# Patient Record
Sex: Male | Born: 1985
Health system: Southern US, Community
[De-identification: ages and names within clinical notes are randomized; demographics above are authoritative.]

## PROBLEM LIST (undated history)

## (undated) DIAGNOSIS — E785 Hyperlipidemia, unspecified: Secondary | ICD-10-CM

## (undated) DIAGNOSIS — F329 Major depressive disorder, single episode, unspecified: Secondary | ICD-10-CM

## (undated) DIAGNOSIS — F32A Depression, unspecified: Secondary | ICD-10-CM

## (undated) DIAGNOSIS — Z8619 Personal history of other infectious and parasitic diseases: Secondary | ICD-10-CM

## (undated) HISTORY — DX: Depression, unspecified: F32.A

## (undated) HISTORY — PX: NO PAST SURGERIES: SHX2092

## (undated) HISTORY — DX: Personal history of other infectious and parasitic diseases: Z86.19

## (undated) HISTORY — DX: Hyperlipidemia, unspecified: E78.5

## (undated) HISTORY — PX: WISDOM TOOTH EXTRACTION: SHX21

---
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
## 2003-08-17 ENCOUNTER — Encounter: Admission: RE | Admit: 2003-08-17 | Discharge: 2003-08-17 | Payer: Self-pay | Admitting: Family Medicine

## 2006-01-20 ENCOUNTER — Encounter: Admission: RE | Admit: 2006-01-20 | Discharge: 2006-01-20 | Payer: Self-pay | Admitting: Family Medicine

## 2006-04-21 ENCOUNTER — Emergency Department (HOSPITAL_COMMUNITY): Admission: EM | Admit: 2006-04-21 | Discharge: 2006-04-22 | Payer: Self-pay | Admitting: Emergency Medicine

## 2008-06-03 ENCOUNTER — Emergency Department (HOSPITAL_COMMUNITY): Admission: EM | Admit: 2008-06-03 | Discharge: 2008-06-03 | Payer: Self-pay | Admitting: Emergency Medicine

## 2014-04-25 ENCOUNTER — Encounter: Payer: Self-pay | Admitting: Family Medicine

## 2014-04-25 DIAGNOSIS — Z0289 Encounter for other administrative examinations: Secondary | ICD-10-CM

## 2014-04-25 NOTE — Progress Notes (Signed)
error    This encounter was created in error - please disregard.

## 2014-09-26 ENCOUNTER — Encounter (HOSPITAL_COMMUNITY): Payer: Self-pay | Admitting: Emergency Medicine

## 2014-09-26 ENCOUNTER — Emergency Department (HOSPITAL_COMMUNITY)
Admission: EM | Admit: 2014-09-26 | Discharge: 2014-09-26 | Disposition: A | Discharge status: Home / Self Care | Payer: 59 | Attending: Emergency Medicine | Admitting: Emergency Medicine

## 2014-09-26 DIAGNOSIS — R51 Headache: Secondary | ICD-10-CM

## 2014-09-26 DIAGNOSIS — R7989 Other specified abnormal findings of blood chemistry: Secondary | ICD-10-CM | POA: Diagnosis present

## 2014-09-26 DIAGNOSIS — B2790 Infectious mononucleosis, unspecified without complication: Secondary | ICD-10-CM | POA: Diagnosis not present

## 2014-09-26 DIAGNOSIS — Z87891 Personal history of nicotine dependence: Secondary | ICD-10-CM | POA: Insufficient documentation

## 2014-09-26 DIAGNOSIS — R519 Headache, unspecified: Secondary | ICD-10-CM

## 2014-09-26 LAB — URINALYSIS, ROUTINE W REFLEX MICROSCOPIC
Glucose, UA: NEGATIVE mg/dL
Hgb urine dipstick: NEGATIVE
KETONES UR: 15 mg/dL — AB
NITRITE: NEGATIVE
PROTEIN: NEGATIVE mg/dL
Specific Gravity, Urine: 1.018 (ref 1.005–1.030)
Urobilinogen, UA: 4 mg/dL — ABNORMAL HIGH (ref 0.0–1.0)
pH: 6 (ref 5.0–8.0)

## 2014-09-26 LAB — CBC WITH DIFFERENTIAL/PLATELET
Basophils Absolute: 0.1 10*3/uL (ref 0.0–0.1)
Basophils Relative: 1 % (ref 0–1)
EOS ABS: 0 10*3/uL (ref 0.0–0.7)
Eosinophils Relative: 0 % (ref 0–5)
HCT: 41.3 % (ref 39.0–52.0)
Hemoglobin: 14.1 g/dL (ref 13.0–17.0)
LYMPHS PCT: 75 % — AB (ref 12–46)
Lymphs Abs: 4.9 10*3/uL — ABNORMAL HIGH (ref 0.7–4.0)
MCH: 29.7 pg (ref 26.0–34.0)
MCHC: 34.1 g/dL (ref 30.0–36.0)
MCV: 86.9 fL (ref 78.0–100.0)
MONOS PCT: 3 % (ref 3–12)
Monocytes Absolute: 0.2 10*3/uL (ref 0.1–1.0)
Neutro Abs: 1.4 10*3/uL — ABNORMAL LOW (ref 1.7–7.7)
Neutrophils Relative %: 21 % — ABNORMAL LOW (ref 43–77)
Platelets: 133 10*3/uL — ABNORMAL LOW (ref 150–400)
RBC: 4.75 MIL/uL (ref 4.22–5.81)
RDW: 13.3 % (ref 11.5–15.5)
WBC: 6.6 10*3/uL (ref 4.0–10.5)

## 2014-09-26 LAB — URINE MICROSCOPIC-ADD ON

## 2014-09-26 LAB — I-STAT CHEM 8, ED
BUN: 5 mg/dL — ABNORMAL LOW (ref 6–23)
Calcium, Ion: 1.13 mmol/L (ref 1.12–1.23)
Chloride: 98 mmol/L (ref 96–112)
Creatinine, Ser: 0.9 mg/dL (ref 0.50–1.35)
Glucose, Bld: 109 mg/dL — ABNORMAL HIGH (ref 70–99)
HCT: 43 % (ref 39.0–52.0)
HEMOGLOBIN: 14.6 g/dL (ref 13.0–17.0)
Potassium: 3.8 mmol/L (ref 3.5–5.1)
Sodium: 138 mmol/L (ref 135–145)
TCO2: 22 mmol/L (ref 0–100)

## 2014-09-26 MED ORDER — VALPROATE SODIUM 500 MG/5ML IV SOLN
500.0000 mg | Freq: Once | INTRAVENOUS | Status: AC
  Administered 2014-09-26: 500 mg via INTRAVENOUS
  Filled 2014-09-26: qty 5

## 2014-09-26 MED ORDER — METOCLOPRAMIDE HCL 5 MG/ML IJ SOLN
10.0000 mg | Freq: Once | INTRAMUSCULAR | Status: AC
  Administered 2014-09-26: 10 mg via INTRAVENOUS
  Filled 2014-09-26: qty 2

## 2014-09-26 MED ORDER — DIPHENHYDRAMINE HCL 50 MG/ML IJ SOLN
25.0000 mg | Freq: Once | INTRAMUSCULAR | Status: AC
  Administered 2014-09-26: 25 mg via INTRAVENOUS
  Filled 2014-09-26: qty 1

## 2014-09-26 MED ORDER — ACETAMINOPHEN 325 MG PO TABS
650.0000 mg | ORAL_TABLET | Freq: Once | ORAL | Status: AC
  Administered 2014-09-26: 650 mg via ORAL
  Filled 2014-09-26: qty 2

## 2014-09-26 MED ORDER — SODIUM CHLORIDE 0.9 % IV BOLUS (SEPSIS)
1000.0000 mL | INTRAVENOUS | Status: AC
  Administered 2014-09-26: 1000 mL via INTRAVENOUS

## 2014-09-26 MED ORDER — DEXAMETHASONE SODIUM PHOSPHATE 10 MG/ML IJ SOLN
10.0000 mg | Freq: Once | INTRAMUSCULAR | Status: AC
  Administered 2014-09-26: 10 mg via INTRAVENOUS
  Filled 2014-09-26: qty 1

## 2014-09-26 MED ORDER — DEXAMETHASONE SODIUM PHOSPHATE 10 MG/ML IJ SOLN
10.0000 mg | Freq: Once | INTRAMUSCULAR | Status: DC
Start: 2014-09-26 — End: 2014-09-26

## 2014-09-26 NOTE — ED Provider Notes (Signed)
CSN: [id number]     Arrival date & time 09/26/14  1702 History   First MD Initiated Contact with Patient 09/26/14 1833     Chief Complaint  Patient presents with  . Abnormal Lab     (Consider location/radiation/quality/duration/timing/severity/associated sxs/prior Treatment) Patient is a 29 y.o. male presenting with headaches. The history is provided by the patient.  Headache Pain location:  Frontal Quality:  Dull Radiates to:  Eyes Severity currently:  10/10 Severity at highest:  10/10 Onset quality:  Gradual Duration:  5 days Timing:  Constant Progression:  Worsening Chronicity:  New Context comment:  At rest Relieved by:  Nothing Worsened by:  Activity Ineffective treatments:  NSAIDs Associated symptoms: fatigue   Associated symptoms: no abdominal pain, no cough, no diarrhea, no eye pain, no fever, no nausea, no neck pain, no numbness and no vomiting     History reviewed. No pertinent past medical history. History reviewed. No pertinent past surgical history. No family history on file. History  Substance Use Topics  . Smoking status: Former Games developer  . Smokeless tobacco: Not on file  . Alcohol Use: Yes     Comment: ocassional    Review of Systems  Constitutional: Positive for fatigue. Negative for fever.  HENT: Negative for drooling and rhinorrhea.   Eyes: Negative for pain.  Respiratory: Negative for cough and shortness of breath.   Cardiovascular: Negative for chest pain and leg swelling.  Gastrointestinal: Negative for nausea, vomiting, abdominal pain and diarrhea.  Genitourinary: Negative for dysuria and hematuria.  Musculoskeletal: Negative for gait problem and neck pain.  Skin: Negative for color change.  Neurological: Positive for headaches. Negative for numbness.  Hematological: Negative for adenopathy.  Psychiatric/Behavioral: Negative for behavioral problems.  All other systems reviewed and are negative.     Allergies  Review of patient's  allergies indicates not on file.  Home Medications   Prior to Admission medications   Not on File   BP 140/77 mmHg  Pulse 97  Temp(Src) 98.6 F (37 C) (Oral)  Resp 18  Ht  (1.854 m)  Wt 215 lb (97.523 kg)  BMI 28.37 kg/m2  SpO2 99% Physical Exam  Constitutional: He is oriented to person, place, and time. He appears well-developed and well-nourished.  HENT:  Head: Normocephalic and atraumatic.  Right Ear: External ear normal.  Left Ear: External ear normal.  Nose: Nose normal.  Mouth/Throat: Oropharynx is clear and moist. No oropharyngeal exudate.  No significant cervical adenopathy.  Eyes: Conjunctivae and EOM are normal. Pupils are equal, round, and reactive to light.  Neck: Normal range of motion. Neck supple.  Cardiovascular: Normal rate, regular rhythm, normal heart sounds and intact distal pulses.  Exam reveals no gallop and no friction rub.   No murmur heard. Pulmonary/Chest: Effort normal and breath sounds normal. No respiratory distress. He has no wheezes.  Abdominal: Soft. Bowel sounds are normal. He exhibits no distension. There is no tenderness. There is no rebound and no guarding.  Musculoskeletal: Normal range of motion. He exhibits no edema or tenderness.  Neurological: He is alert and oriented to person, place, and time.  alert, oriented x3 speech: normal in context and clarity memory: intact grossly cranial nerves II-XII: intact motor strength: full proximally and distally no involuntary movements or tremors sensation: intact to light touch diffusely  cerebellar: finger-to-nose and heel-to-shin intact gait: normal forwards and backwards  Skin: Skin is warm and dry.  Psychiatric: He has a normal mood and affect. His behavior is normal.  Nursing note and vitals reviewed.   ED Course  Procedures (including critical care time) Labs Review Labs Reviewed  CBC WITH DIFFERENTIAL/PLATELET - Abnormal; Notable for the following:    Platelets 133 (*)     Neutrophils Relative % 21 (*)    Lymphocytes Relative 75 (*)    Neutro Abs 1.4 (*)    Lymphs Abs 4.9 (*)    All other components within normal limits  I-STAT CHEM 8, ED - Abnormal; Notable for the following:    BUN 5 (*)    Glucose, Bld 109 (*)    All other components within normal limits  URINALYSIS, ROUTINE W REFLEX MICROSCOPIC    Imaging Review No results found.   EKG Interpretation None      MDM   Final diagnoses:  Mononucleosis  Headache, unspecified headache type    7:06 PM 29 y.o. male who presents with a gradual onset headache which began 5 days ago. He denies any fevers. He has taken NSAIDs without significant relief. He states that he has developed fatigue and dark urine. He denies any vomiting, diarrhea, sore throat, or abdominal pain. He was seen at an urgent care [facility] this morning. He was found to have a platelet count of 145, lymphocytosis, bilirubin of 5.8, AST of 141, and ALT of 245. He states that he also had a monotest performed and was called and was informed that it was positive. This would likely explain his lab abnormalities. He is afebrile and vital signs are unremarkable here. He has a normal neurologic exam. Will treat headache symptomatically.  10:57 PM: HA now 2/10. Pt feeling much better. I do suspect that his complex of lab abn's, fatigue, HA is related to mononucleosis. He has a normal neuro exam and continues to appear well.  I have discussed the diagnosis/risks/treatment options with the patient and believe the pt to be eligible for discharge home to follow-up with his pcp. We also discussed returning to the ED immediately if new or worsening sx occur. We discussed the sx which are most concerning (e.g., worsening HA, fever) that necessitate immediate return. Medications administered to the patient during their visit and any new prescriptions provided to the patient are listed below.  Medications given during this visit Medications   sodium chloride 0.9 % bolus 1,000 mL (0 mLs Intravenous Stopped 09/26/14 2024)  metoCLOPramide (REGLAN) injection 10 mg (10 mg Intravenous Given 09/26/14 1907)  diphenhydrAMINE (BENADRYL) injection 25 mg (25 mg Intravenous Given 09/26/14 1907)  acetaminophen (TYLENOL) tablet 650 mg (650 mg Oral Given 09/26/14 1906)  dexamethasone (DECADRON) injection 10 mg (10 mg Intravenous Given 09/26/14 2116)  valproate (DEPACON) 500 mg in dextrose 5 % 50 mL IVPB (0 mg Intravenous Stopped 09/26/14 2236)  sodium chloride 0.9 % bolus 1,000 mL (1,000 mLs Intravenous New Bag/Given 09/26/14 2117)    Discharge Medication List as of 09/26/2014 10:59 PM       Purvis SheffieldForrest Tanna Loeffler, MD 09/27/14 1501

## 2014-09-26 NOTE — ED Notes (Signed)
Spoke with Pharmacy, Per MD he wants the [place] to run over 20 mins. Although the order states over one hour. Per Pharmacy rate should be set at, 120 ML/ HR

## 2014-09-26 NOTE — ED Notes (Signed)
Pt st's [facility] for persistent headache of 5 days.  Pt st's he just received a call and was told he tested positive for mono.  Pt was [facility] to [facility] but st's he had rather come here

## 2014-09-28 LAB — PATHOLOGIST SMEAR REVIEW: Path Review: REACTIVE

## 2014-10-13 ENCOUNTER — Encounter: Payer: Self-pay | Admitting: Family Medicine

## 2014-10-13 ENCOUNTER — Ambulatory Visit (INDEPENDENT_AMBULATORY_CARE_PROVIDER_SITE_OTHER): Payer: 59 | Admitting: Family Medicine

## 2014-10-13 VITALS — BP 112/74 | HR 88 | Temp 97.9°F | Ht 71.75 in | Wt 205.0 lb

## 2014-10-13 DIAGNOSIS — Z7189 Other specified counseling: Secondary | ICD-10-CM

## 2014-10-13 DIAGNOSIS — G8929 Other chronic pain: Secondary | ICD-10-CM

## 2014-10-13 DIAGNOSIS — M549 Dorsalgia, unspecified: Secondary | ICD-10-CM

## 2014-10-13 DIAGNOSIS — Z7689 Persons encountering health services in other specified circumstances: Secondary | ICD-10-CM

## 2014-10-13 DIAGNOSIS — Z0000 Encounter for general adult medical examination without abnormal findings: Secondary | ICD-10-CM

## 2014-10-13 NOTE — Progress Notes (Signed)
Pre visit review using our clinic review tool, if applicable. No additional management support is needed unless otherwise documented below in the visit note. 

## 2014-10-13 NOTE — Patient Instructions (Signed)
BEFORE YOU LEAVE: -schedule fasting lab appointment  We recommend the following healthy lifestyle measures: - eat a healthy diet consisting of lots of vegetables, fruits, beans, nuts, seeds, healthy meats such as white chicken and fish and whole grains.  - avoid fried foods, fast food, processed foods, sodas, red meet and other fattening foods.  - get a least 150 minutes of aerobic exercise per week.

## 2014-10-13 NOTE — Progress Notes (Signed)
HPI:  Jonathan Strong is here to establish care. Needs   Has the following chronic problems that require follow up and concerns today:  Chronic Back Pain: -DDD saw several back specialists for this in 2015, radiation in the past (sciatica) - saw [name] in the past and had inj in the past (2015-07/2014) -sees chiropractor for this now and is doing better -denies: weakness, numbness, pain now, hx of bowel or bladder incontinence  Has been working on improving diet and has quit drinking. No regular CV exercise but plans to start biking.  ROS negative for unless reported above: fevers, unintentional weight loss, hearing or vision loss, chest pain, palpitations, struggling to breath, hemoptysis, melena, hematochezia, hematuria, falls, loc, si, thoughts of self harm  Past Medical History  Diagnosis Date  . History of chicken pox   . Hyperlipidemia   . History of mononucleosis     [facility] per patient    No past surgical history on file.  Family History  Problem Relation Age of Onset  . Mental illness Brother   . Bipolar disorder Brother   . Schizophrenia Father     History   Social History  . Marital Status: Married    Spouse Name: N/A  . Number of Children: N/A  . Years of Education: N/A   Social History Main Topics  . Smoking status: Former Games developermoker  . Smokeless tobacco: Not on file  . Alcohol Use: No     Comment: ocassional  . Drug Use: No  . Sexual Activity: Not on file   Other Topics Concern  . 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  EXAMCeasar Mons:  Filed Vitals:   10/13/14 1111  BP: 112/74  Pulse: 88  Temp: 97.9 F (36.6 C)    Body  mass index is 28.01 kg/(m^2).  GENERAL: vitals reviewed and listed above, alert, oriented, appears well hydrated and in no acute distress  HEENT: atraumatic, conjunttiva clear, no obvious abnormalities on inspection of external nose and ears  NECK: no obvious masses on inspection  LUNGS: clear to auscultation bilaterally, no wheezes, rales or rhonchi, good air movement  CV: HRRR, no peripheral edema  GU: deferred  MS: moves all extremities without noticeable abnormality  PSYCH: pleasant and cooperative, no obvious depression or anxiety  ASSESSMENT AND PLAN:  Discussed the following assessment and plan:  Visit for preventive health examination - Plan: HIV antibody (with reflex), Lipid Panel, Hemoglobin A1c  Encounter to establish care  Chronic back pain, Low Back Pain  -We reviewed the PMH, PSH, FH, SH, Meds and Allergies. -We provided refills for any medications we will prescribe as needed. -We addressed current concerns per orders and patient instructions. -We have asked for records for pertinent exams, studies, vaccines and notes from previous providers. -We have advised patient to follow up per instructions below. -ad   -Patient advised to return or notify a doctor immediately if symptoms worsen or persist or new concerns arise.  Patient Instructions  BEFORE YOU LEAVE: -schedule fasting lab appointment  We recommend the  following healthy lifestyle measures: - eat a healthy diet consisting of lots of vegetables, fruits, beans, nuts, seeds, healthy meats such as white chicken and fish and whole grains.  - avoid fried foods, fast food, processed foods, sodas, red meet and other fattening foods.  - get a least 150 minutes of aerobic exercise per week.        Kriste Basque R.

## 2014-10-17 ENCOUNTER — Other Ambulatory Visit (INDEPENDENT_AMBULATORY_CARE_PROVIDER_SITE_OTHER): Payer: 59

## 2014-10-17 DIAGNOSIS — R7989 Other specified abnormal findings of blood chemistry: Secondary | ICD-10-CM

## 2014-10-17 DIAGNOSIS — Z0000 Encounter for general adult medical examination without abnormal findings: Secondary | ICD-10-CM

## 2014-10-17 LAB — LIPID PANEL
CHOLESTEROL: 173 mg/dL (ref 0–200)
HDL: 29.6 mg/dL — ABNORMAL LOW (ref >39.00)
NonHDL: 143.4
Total CHOL/HDL Ratio: 6
Triglycerides: 239 mg/dL — ABNORMAL HIGH (ref 0.0–149.0)
VLDL: 47.8 mg/dL — ABNORMAL HIGH (ref 0.0–40.0)

## 2014-10-17 LAB — HEMOGLOBIN A1C: Hgb A1c MFr Bld: 5.5 % (ref 4.6–6.5)

## 2014-10-17 LAB — LDL CHOLESTEROL, DIRECT: Direct LDL: 105 mg/dL

## 2014-10-18 LAB — HIV ANTIBODY (ROUTINE TESTING W REFLEX): HIV 1&2 Ab, 4th Generation: NONREACTIVE

## 2015-03-28 ENCOUNTER — Encounter: Payer: Self-pay | Admitting: Family Medicine

## 2015-03-28 ENCOUNTER — Ambulatory Visit (INDEPENDENT_AMBULATORY_CARE_PROVIDER_SITE_OTHER): Payer: 59 | Admitting: Family Medicine

## 2015-03-28 VITALS — BP 108/80 | HR 99 | Temp 99.3°F | Ht 71.75 in | Wt 203.1 lb

## 2015-03-28 DIAGNOSIS — J029 Acute pharyngitis, unspecified: Secondary | ICD-10-CM

## 2015-03-28 LAB — POCT RAPID STREP A (OFFICE): Rapid Strep A Screen: NEGATIVE

## 2015-03-28 MED ORDER — AMOXICILLIN 875 MG PO TABS: 875.0000 mg | ORAL_TABLET | Freq: Two times a day (BID) | ORAL | Status: DC

## 2015-03-28 NOTE — Progress Notes (Signed)
   HPI:  Sore throat: -started: 4 days ago -symptoms:sore throat, fevers, gi distress with diarrhea - this has now resolved -denies:SOB, vomiting, tooth pain, inability to swallow, sinus congestion or pain, cough -has tried: nothing -sick contacts/travel/risks: coworker with strep prior to him getting symptoms; denies tick bite, STI exposure, travel  ROS: See pertinent positives and negatives per HPI.  Past Medical History  Diagnosis Date  . History of chicken pox   . Hyperlipidemia   . History of mononucleosis     treated at [facility] 3 weeks ago per patient    No past surgical history on file.  Family History  Problem Relation Age of Onset  . Mental illness Brother   . Bipolar disorder Brother   . Schizophrenia Father     Social History   Social History  . Marital Status: Married    Spouse Name: N/A  . Number of Children: N/A  . Years of Education: N/A   Social History Main Topics  . Smoking status: Former Games developer  . Smokeless tobacco: None  . Alcohol Use: No     Comment: ocassional  . Drug Use: No  . Sexual Activity: Not Asked   Other Topics Concern  . 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  amoxicillin (AMOXIL) 875 MG tablet, Take 1 tablet (875 mg total) by mouth 2 (two) times daily., Disp: 20 tablet, Rfl: 0  EXAM:  Filed Vitals:   03/28/15 1104  BP: 108/80  Pulse: 99  Temp: 99.3 F (37.4 C)    Body mass index is 27.75 kg/(m^2).  GENERAL: vitals reviewed and listed above, alert, oriented, appears well hydrated and in no acute distress  HEENT: atraumatic, conjunttiva clear, no obvious abnormalities on inspection of external nose and ears, normal appearance of ear  canals and TMs, mild post oropharyngeal erythema, 1+ tonsillar edema w/ exudate, no sinus TTP  NECK: no obvious masses on inspection  LUNGS: clear to auscultation bilaterally, no wheezes, rales or rhonchi, good air movement  CV: HRRR, no peripheral edema  MS: moves all extremities without noticeable abnormality  PSYCH: pleasant and cooperative, no obvious depression or anxiety  ASSESSMENT AND PLAN:  Discussed the following assessment and plan:  Acute pharyngitis, unspecified pharyngitis type  -known strep exposure and symptoms and exam c/w strep pharyngitis - opted to treat (discussed risks) regardless of rapid strep results -discussed other potential causes, offered HIV testing - declined -of course, we advised to return or notify a doctor immediately if symptoms worsen or persist or new concerns arise.    There are no Patient Instructions on file for this visit.   Kriste Basque R.

## 2015-03-28 NOTE — Progress Notes (Signed)
Pre visit review using our clinic review tool, if applicable. No additional management support is needed unless otherwise documented below in the visit note. 

## 2015-03-31 LAB — CULTURE, GROUP A STREP

## 2015-06-01 ENCOUNTER — Telehealth: Payer: Self-pay | Admitting: Family Medicine

## 2015-06-01 NOTE — Telephone Encounter (Signed)
I do not have a recommendation for a particular clinic for chiropractic care - this is usually something patients seek out and do on their own.

## 2015-06-01 NOTE — Telephone Encounter (Signed)
I left a detailed message with the information below at the pts cell number. 

## 2015-06-01 NOTE — Telephone Encounter (Signed)
Patient would like a referral for a chiropractor for his back problems.

## 2015-09-27 ENCOUNTER — Encounter: Payer: Self-pay | Admitting: Family Medicine

## 2015-09-27 ENCOUNTER — Ambulatory Visit (INDEPENDENT_AMBULATORY_CARE_PROVIDER_SITE_OTHER): Payer: BLUE CROSS/BLUE SHIELD | Admitting: Family Medicine

## 2015-09-27 VITALS — BP 120/84 | HR 86 | Temp 98.4°F | Ht 71.75 in | Wt 207.1 lb

## 2015-09-27 DIAGNOSIS — M549 Dorsalgia, unspecified: Secondary | ICD-10-CM | POA: Diagnosis not present

## 2015-09-27 DIAGNOSIS — M25531 Pain in right wrist: Secondary | ICD-10-CM | POA: Diagnosis not present

## 2015-09-27 DIAGNOSIS — G8929 Other chronic pain: Secondary | ICD-10-CM

## 2015-09-27 MED ORDER — DIAZEPAM 2 MG PO TABS: 2.0000 mg | ORAL_TABLET | Freq: Two times a day (BID) | ORAL | Status: DC | PRN

## 2015-09-27 NOTE — Patient Instructions (Signed)
Before you leave: -Schedule follow-up in 4 weeks, may call several days before and cancel if this pain is completely gone  Ice twice daily.  Naproxen as instructed for pain as needed.  Please continue gentle activities.  I do not usually prescribe Valium, however given her situation, there is a new prescription for a small amount to use rarely for severe muscle spasm.

## 2015-09-27 NOTE — Progress Notes (Signed)
Pre visit review using our clinic review tool, if applicable. No additional management support is needed unless otherwise documented below in the visit note. 
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
## 2015-10-26 ENCOUNTER — Encounter: Payer: Self-pay | Admitting: Family Medicine

## 2015-10-26 ENCOUNTER — Ambulatory Visit (INDEPENDENT_AMBULATORY_CARE_PROVIDER_SITE_OTHER): Payer: BLUE CROSS/BLUE SHIELD | Admitting: Family Medicine

## 2015-10-26 VITALS — BP 110/80 | HR 85 | Temp 98.6°F | Ht 71.75 in | Wt 206.4 lb

## 2015-10-26 DIAGNOSIS — M549 Dorsalgia, unspecified: Secondary | ICD-10-CM | POA: Diagnosis not present

## 2015-10-26 DIAGNOSIS — G8929 Other chronic pain: Secondary | ICD-10-CM

## 2015-10-26 NOTE — Patient Instructions (Addendum)
Before you leave:  - after your physical in about 2-3 months; please come fasting and we will plan to do lab work that day  Please try the dietary and other treatments that we discussed , healthy diet, tumeric,  Ice as needed. Please call here or follow up with your orthopedic doctor if symptoms are not resolved in 1 month.  If your orthopedic office requires a referral please let us know.

## 2015-10-26 NOTE — Progress Notes (Signed)
Pre visit review using our clinic review tool, if applicable. No additional management support is needed unless otherwise documented below in the visit note. 
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
## 2016-02-12 ENCOUNTER — Ambulatory Visit (INDEPENDENT_AMBULATORY_CARE_PROVIDER_SITE_OTHER): Payer: BLUE CROSS/BLUE SHIELD | Admitting: Family Medicine

## 2016-02-12 ENCOUNTER — Encounter: Payer: BLUE CROSS/BLUE SHIELD | Admitting: Family Medicine

## 2016-02-12 ENCOUNTER — Encounter: Payer: Self-pay | Admitting: Family Medicine

## 2016-02-12 VITALS — BP 110/82 | HR 77 | Temp 98.0°F | Ht 71.75 in | Wt 207.8 lb

## 2016-02-12 DIAGNOSIS — Z9852 Vasectomy status: Secondary | ICD-10-CM

## 2016-02-12 DIAGNOSIS — Z0000 Encounter for general adult medical examination without abnormal findings: Secondary | ICD-10-CM

## 2016-02-12 DIAGNOSIS — G8929 Other chronic pain: Secondary | ICD-10-CM

## 2016-02-12 DIAGNOSIS — IMO0001 Reserved for inherently not codable concepts without codable children: Secondary | ICD-10-CM

## 2016-02-12 DIAGNOSIS — M549 Dorsalgia, unspecified: Secondary | ICD-10-CM | POA: Diagnosis not present

## 2016-02-12 DIAGNOSIS — E785 Hyperlipidemia, unspecified: Secondary | ICD-10-CM

## 2016-02-12 LAB — CHOLESTEROL, TOTAL: Cholesterol: 206 mg/dL — ABNORMAL HIGH (ref 0–200)

## 2016-02-12 LAB — HDL CHOLESTEROL: HDL: 38.4 mg/dL — ABNORMAL LOW (ref >39.00)

## 2016-02-12 NOTE — Progress Notes (Signed)
HPI:  Here for CPE:  -Concerns and/or follow up today:   Dyslipidemia: -advised med diet and exercise -reports healthy diet and regular exercise  Desires vasectomy. Has 2 "perfect children ".reports why would I messed with that. Is sure that he is done having children.  Many moles. Sees dermatologist for skin exams.  Reports wrist and back pains completely resolved. Continues home exercises for the back. Reports no flares in the long time.  -Diet: variety of foods, balance and well rounded  -Exercise: regular exercise  -Diabetes and Dyslipidemia Screening: Not fasting  -Hx of HTN: no  -Vaccines: Declines  -sexual activity: yes, male partner - wife, no new partners  -wants STI testing, Hep C screening (if born [id number]): no  -FH colon or prstate ca: see FH Last colon cancer screening:n/a Last prostate ca screening:n/a  -Alcohol, Tobacco, drug use: see social history  Review of Systems - no fevers, unintentional weight loss, vision loss, hearing loss, chest pain, sob, hemoptysis, melena, hematochezia, hematuria, genital discharge, changing or concerning skin lesions, bleeding, bruising, loc, thoughts of self harm or SI  Past Medical History  Diagnosis Date  . History of chicken pox   . Hyperlipidemia   . History of mononucleosis     [facility] per patient    No past surgical history on file.  Family History  Problem Relation Age of Onset  . Mental illness Brother   . Bipolar disorder Brother   . Schizophrenia Father     Social History   Social History  . Marital Status: Married    Spouse Name: N/A  . Number of Children: N/A  . Years of Education: N/A   Social History Main Topics  . Smoking status: Former Games developermoker  . Smokeless tobacco: None  . Alcohol Use: No     Comment: ocassional  . Drug Use: No  . Sexual Activity: Not Asked   Other Topics Concern  . 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  diazepam (VALIUM) 2 MG tablet, Take 1 tablet (2 mg total) by mouth every 12 (twelve) hours as needed for muscle spasms., Disp: 10 tablet, Rfl: 0  EXAM:  Filed Vitals:   02/12/16 0822  BP: 110/82  Pulse: 77  Temp: 98 F (36.7 C)  TempSrc: Oral  Height: 5' 11.75" (1.822 m)  Weight: 207 lb 12.8 oz (94.257 kg)    Estimated body mass index is 28.39 kg/(m^2) as calculated from the following:   Height as of this encounter: 5' 11.75" (1.822 m).   Weight as of this encounter: 207 lb 12.8 oz (94.257 kg).  GENERAL: vitals reviewed and listed below, alert, oriented, appears well hydrated and in no acute distress  HEENT: head atraumatic, PERRLA, normal appearance of eyes, ears, nose and mouth. moist mucus membranes.  NECK: supple, no masses or lymphadenopathy  LUNGS: clear to auscultation bilaterally, no rales, rhonchi or wheeze  CV: HRRR, no peripheral edema or cyanosis, normal pedal pulses  ABDOMEN: bowel sounds normal, soft, non tender to palpation, no masses, no rebound or guarding  GU: Declined  SKIN: Does for skin check dermatologist  MS: normal gait, moves all extremities normally  NEURO: normal gait, speech and thought processing grossly intact, muscle tone grossly intact throughout  PSYCH: normal affect, pleasant and cooperative  ASSESSMENT AND PLAN:  Discussed the following assessment and plan:  Visit for preventive health examination  Chronic back pain, Low Back Pain  Hyperlipemia - Plan: Cholesterol, Total, HDL cholesterol  Vasectomy planned - Plan: Ambulatory referral to Urology  -Discussed and advised all [place] preventive services health task force level A and B recommendations for age, sex and  risks.  --Advised at least 150 minutes of exercise per week and a healthy diet with avoidance of (less then 1 serving per week) processed foods, white starches, red meat, fast foods and sweets and consisting of: * 5-9 servings of fresh fruits and vegetables (not corn or potatoes) *nuts and seeds, beans *olives and olive oil *lean meats such as fish and white chicken  *whole grains  -skin exams with gyn  -non-fasting labs, studies and vaccines per orders this encounter   Patient advised to return to clinic immediately if symptoms worsen or persist or new concerns.  Patient Instructions  BEFORE YOU LEAVE: -follow up: yearly -labs  -We placed a referral for you as discussed for the vasectomy. It usually takes about 1-2 weeks to process and schedule this referral. If you have not heard from [place] regarding this appointment in 2 weeks please contact our office.   We recommend the following healthy lifestyle: 1) Small portions - eat off of salad plate instead of dinner plate 2) Eat a healthy clean diet with avoidance of (less then 1 serving per week) processed foods, sweetened drinks, white starches, red meat, fast foods and sweets and consisting of: * 5-9 servings per day of fresh or frozen fruits and vegetables (not corn or potatoes, not dried or canned) *nuts and seeds, beans *olives and olive oil *small portions of lean meats such as fish and white chicken  *small portions of whole grains 3)Get at least 150 minutes of sweaty aerobic exercise per week 4)reduce stress - counseling, meditation, relaxation to balance other aspects of your life  We have ordered labs or studies at this visit. It can take up to 1-2 weeks for results and processing. IF results require follow up or explanation, we will call you with instructions. Clinically stable results will be released to your [facility]. If you have not heard from [place] or cannot find your results in [facility] in 2 weeks please contact our office at  [phone].  If you are not yet signed up for [facility], please consider signing up.            No Follow-up on file.   Kriste Basque R., DO

## 2016-02-12 NOTE — Patient Instructions (Signed)
BEFORE YOU LEAVE: -follow up: yearly -labs  -We placed a referral for you as discussed for the vasectomy. It usually takes about 1-2 weeks to process and schedule this referral. If you have not heard from us regarding this appointment in 2 weeks please contact our office.   We recommend the following healthy lifestyle: 1) Small portions - eat off of salad plate instead of dinner plate 2) Eat a healthy clean diet with avoidance of (less then 1 serving per week) processed foods, sweetened drinks, white starches, red meat, fast foods and sweets and consisting of: * 5-9 servings per day of fresh or frozen fruits and vegetables (not corn or potatoes, not dried or canned) *nuts and seeds, beans *olives and olive oil *small portions of lean meats such as fish and white chicken  *small portions of whole grains 3)Get at least 150 minutes of sweaty aerobic exercise per week 4)reduce stress - counseling, meditation, relaxation to balance other aspects of your life  We have ordered labs or studies at this visit. It can take up to 1-2 weeks for results and processing. IF results require follow up or explanation, we will call you with instructions. Clinically stable results will be released to your [facility]. If you have not heard from [place] or cannot find your results in [facility] in 2 weeks please contact our office at [phone].  If you are not yet signed [facility] for [facility], please consider signing up.

## 2016-02-12 NOTE — Progress Notes (Signed)
Pre visit review using our clinic review tool, if applicable. No additional management support is needed unless otherwise documented below in the visit note. 

## 2016-09-10 ENCOUNTER — Encounter: Payer: Self-pay | Admitting: Family Medicine

## 2016-09-10 ENCOUNTER — Ambulatory Visit (INDEPENDENT_AMBULATORY_CARE_PROVIDER_SITE_OTHER): Payer: BLUE CROSS/BLUE SHIELD | Admitting: Family Medicine

## 2016-09-10 VITALS — BP 127/77 | HR 100 | Temp 98.7°F | Ht 71.75 in | Wt 212.0 lb

## 2016-09-10 DIAGNOSIS — J111 Influenza due to unidentified influenza virus with other respiratory manifestations: Secondary | ICD-10-CM | POA: Diagnosis not present

## 2016-09-10 MED ORDER — OSELTAMIVIR PHOSPHATE 75 MG PO CAPS: 75.0000 mg | ORAL_CAPSULE | Freq: Two times a day (BID) | ORAL | 0 refills | Status: DC

## 2016-09-10 MED ORDER — PROMETHAZINE HCL 25 MG PO TABS: 25.0000 mg | ORAL_TABLET | ORAL | 0 refills | Status: DC | PRN

## 2016-09-10 NOTE — Progress Notes (Signed)
Pre visit review using our clinic review tool, if applicable. No additional management support is needed unless otherwise documented below in the visit note. 

## 2016-09-10 NOTE — Progress Notes (Signed)
Subjective:    Patient ID: Jonathan Strong, male    DOB: [birth year], 31 y.o.   MRN: [id number]  HPI Here for the onset last night of fever, body aches, headache, a dry cough, and nausea without vomiting. No ST. 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 His wife was seen here 2 days ago with similar symptoms, and she tested positive for the flu. She was given Tamiflu and now she feels much better.    Review of Systems  Constitutional: Positive for chills and fever.  HENT: Negative for congestion, postnasal drip, sinus pain, sinus pressure and sore throat.   Eyes: Negative.   Respiratory: Positive for cough.   Cardiovascular: Negative.   Gastrointestinal: Positive for nausea. Negative for abdominal pain, diarrhea and vomiting.  Musculoskeletal: Positive for myalgias.  Neurological: Positive for headaches.       Objective:   Physical Exam  Constitutional:  Appears ill   HENT:  Right Ear: External ear normal.  Left Ear: External ear normal.  Nose: Nose normal.  Mouth/Throat: Oropharynx is clear and moist.  Eyes: Conjunctivae are normal.  Neck: No thyromegaly present.  Cardiovascular: Normal rate, regular rhythm, normal heart sounds and intact distal pulses.   Pulmonary/Chest: Effort normal and breath sounds normal. No respiratory distress. He has no wheezes. He has no rales.  Lymphadenopathy:    He has no cervical adenopathy.          Assessment & Plan:  This is almost certainly influenza. Treat with Tamiflu for 5 days. Use Phenergan for nausea. Drink fluids and take Ibuprofen prn. Written out of work today and tomorrow.  Gershon Crane, MD

## 2016-11-03 ENCOUNTER — Ambulatory Visit (INDEPENDENT_AMBULATORY_CARE_PROVIDER_SITE_OTHER): Payer: BLUE CROSS/BLUE SHIELD | Admitting: Family Medicine

## 2016-11-03 ENCOUNTER — Encounter: Payer: Self-pay | Admitting: Family Medicine

## 2016-11-03 ENCOUNTER — Ambulatory Visit (INDEPENDENT_AMBULATORY_CARE_PROVIDER_SITE_OTHER)
Admission: RE | Admit: 2016-11-03 | Discharge: 2016-11-03 | Disposition: A | Discharge status: Home / Self Care | Payer: BLUE CROSS/BLUE SHIELD | Source: Ambulatory Visit | Attending: Family Medicine | Admitting: Family Medicine

## 2016-11-03 VITALS — BP 118/82 | HR 72 | Temp 97.9°F | Ht 71.75 in | Wt 208.4 lb

## 2016-11-03 DIAGNOSIS — M25511 Pain in right shoulder: Secondary | ICD-10-CM

## 2016-11-03 DIAGNOSIS — R42 Dizziness and giddiness: Secondary | ICD-10-CM | POA: Diagnosis not present

## 2016-11-03 DIAGNOSIS — M25512 Pain in left shoulder: Secondary | ICD-10-CM

## 2016-11-03 DIAGNOSIS — R202 Paresthesia of skin: Secondary | ICD-10-CM

## 2016-11-03 LAB — TSH: TSH: 1.38 u[IU]/mL (ref 0.35–4.50)

## 2016-11-03 LAB — VITAMIN B12: Vitamin B-12: 360 pg/mL (ref 211–911)

## 2016-11-03 NOTE — Patient Instructions (Signed)
Follow up: -1 month -xray sheet -labs -rotator cuff and neck spasm exercises.  Get the xray of the neck.  Home exercises 4 days per week.  Get the MRI of the head.  Cock up wrist brace for the L wrist.  Aleve if needed for pain per instructions.  I hope you are feeling better soon! Seek care immediately if worsening, new concerns or you are not improving with treatment.

## 2016-11-03 NOTE — Progress Notes (Signed)
Pre visit review using our clinic review tool, if applicable. No additional management support is needed unless otherwise documented below in the visit note. 

## 2016-11-03 NOTE — Progress Notes (Signed)
HPI:  Acute visit for a number of symptoms: -these all started about 1-1.5 months ago -symptoms:   -R> L mod pain only with abd arm above 90 - particularly with weights  -L> R hand paraesthesias, can be whole hand, but usually 1st 3 digits, brief  spells for a few minutes. Riding bike and platelet donating worsens.  -L traps neck pain, mod, intermittent  -"seeing stars" brief, lasts a few seconds, occurs when standing at rest - has had  several spells -hx lumbar back issues, worried about pinched nerve in neck -no fevers, malaise, LE symptoms, weight loss, weakness, LOC, HA, recent illness, vision changes, speech issues, AMS   ROS: See pertinent positives and negatives per HPI.  Past Medical History:  Diagnosis Date  . History of chicken pox   . History of mononucleosis    treated at [facility] 3 weeks ago per patient  . Hyperlipidemia     No past surgical history on file.  Family History  Problem Relation Age of Onset  . Mental illness Brother   . Bipolar disorder Brother   . Schizophrenia Father     Social History   Social History  . Marital status: Married    Spouse name: N/A  . Number of children: N/A  . Years of education: N/A   Social History Main Topics  . Smoking status: Former Games developer  . Smokeless tobacco: Never Used  . Alcohol use No     Comment: ocassional  . Drug use: No  . Sexual activity: Not Asked   Other Topics Concern  . 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  diazepam (VALIUM) 2 MG tablet, Take 1 tablet (2 mg total) by mouth every 12 (twelve) hours as needed for muscle spasms., Disp: 10 tablet, Rfl: 0  EXAM:  Vitals:   11/03/16 0959  BP: 118/82  Pulse: 72   Temp: 97.9 F (36.6 C)    Body mass index is 28.46 kg/m.  GENERAL: vitals reviewed and listed above, alert, oriented, appears well hydrated and in no acute distress  HEENT: atraumatic, conjunttiva clear, no obvious abnormalities on inspection of external nose and ears  NECK: no obvious masses on inspection  LUNGS: clear to auscultation bilaterally, no wheezes, rales or rhonchi, good air movement  CV: HRRR, no peripheral edema  MS/NEURO: moves all extremities without noticeable abnormality, minimal L mid fiber trap TTP, minimal L RTC TTP, no bony TTP neck/shoulders, normal ROM head/neck/UE, neg spurling, normal strngth/sensitivity to light touch and DTRs bilat UEs, bilat UE special tests: -shaw sign, - apprehension, -neers, -speeds, ? Mildly + R impingement, -empty can, -phalen, -tinel, normal radial pulses  PSYCH/NEURO: pleasant and cooperative, no obvious depression or anxiety, speech and thought processing grossly intact, CN II-XII grossly intact, finger to nose normal, gait normal  ASSESSMENT AND PLAN:  Discussed the following assessment and plan:  Bilateral shoulder pain, unspecified chronicity - Plan: MR Brain W Wo Contrast, DG Cervical Spine Complete  Paresthesia - -L hand - Plan: TSH, Vitamin B12, MR Brain W Wo Contrast, DG Cervical Spine Complete  Dizzy spells -  Plan: MR Brain W Wo Contrast  -we discussed possible serious and likely etiologies, workup and treatment, treatment risks and return precautions -after this discussion, Jonathan Strong opted for: -will get labs and imaging per orders to r/o other -HEP for possible RTC tendonopathy, trap muscle spasm -cock up brace for L wrist, ? CTS -close f/u in 1 months -Patient advised to return or notify a doctor immediately if symptoms worsen or persist or new concerns arise.  Patient Instructions  Follow up: -1 month -xray sheet -labs -rotator cuff and neck spasm exercises.  Get the xray of the neck.  Home exercises 4  days per week.  Get the MRI of the head.  Cock up wrist brace for the L wrist.  Aleve if needed for pain per instructions.  I hope you are feeling better soon! Seek care immediately if worsening, new concerns or you are not improving with treatment.     Kriste Basque R., DO

## 2016-11-17 ENCOUNTER — Other Ambulatory Visit: Payer: BLUE CROSS/BLUE SHIELD

## 2016-11-24 ENCOUNTER — Encounter: Payer: Self-pay | Admitting: Family Medicine

## 2016-11-24 ENCOUNTER — Ambulatory Visit (INDEPENDENT_AMBULATORY_CARE_PROVIDER_SITE_OTHER): Payer: BLUE CROSS/BLUE SHIELD | Admitting: Family Medicine

## 2016-11-24 VITALS — BP 124/80 | HR 78 | Temp 98.2°F | Ht 71.75 in | Wt 208.9 lb

## 2016-11-24 DIAGNOSIS — F439 Reaction to severe stress, unspecified: Secondary | ICD-10-CM

## 2016-11-24 NOTE — Progress Notes (Signed)
  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  Past Medical History:  Diagnosis Date  . History of chicken pox   . History of mononucleosis    treated at [facility] 3 weeks ago per patient  . Hyperlipidemia     No past surgical history on file.  Family History  Problem Relation Age of Onset  . Mental illness Brother   . Bipolar disorder Brother   . Schizophrenia Father     Social History   Social History  . Marital status: Married    Spouse name: N/A  . Number of children: N/A  . Years of education: N/A   Social History Main Topics  . Smoking status: Former Games developer  . Smokeless tobacco: Never Used  . Alcohol use No     Comment: ocassional  . Drug use: No  . Sexual activity: Not Asked   Other Topics Concern  . 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  EXAM:  Vitals:   11/24/16 1331  BP: 124/80  Pulse: 78  Temp: 98.2 F (36.8 C)    Body mass index is 28.53 kg/m.  GENERAL: vitals reviewed and listed above, alert, oriented, appears well hydrated and in no acute distress  HEENT:  atraumatic, conjunttiva clear, no obvious abnormalities on inspection of external nose and ears  NECK: no obvious masses on inspection  MS: moves all extremities without noticeable abnormality, normal inspection and palpation back  PSYCH: pleasant and cooperative, no obvious depression or anxiety  ASSESSMENT AND PLAN:  Discussed the following assessment and plan:  Stress  -discussed options for tx stress and muscle soreness -he opted for CBT - numbers provided to call, return precautions -heat, topical tx, OTC options, massage, acupuncture, etc discussed for the back issues, also he is considering sports med referral to Dr. Katrinka Blazing (for OMT)- will call if wishes to do this -Patient advised to return or notify a doctor immediately if symptoms worsen or persist or new concerns arise.  Declined AVS  There are no Patient Instructions on file for this visit.  Kriste Basque R., DO

## 2016-11-24 NOTE — Progress Notes (Signed)
Pre visit review using our clinic review tool, if applicable. No additional management support is needed unless otherwise documented below in the visit note. 

## 2016-12-01 ENCOUNTER — Ambulatory Visit: Payer: BLUE CROSS/BLUE SHIELD | Admitting: Family Medicine
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
## 2017-02-13 ENCOUNTER — Telehealth: Payer: Self-pay | Admitting: Family Medicine

## 2017-02-13 ENCOUNTER — Ambulatory Visit (INDEPENDENT_AMBULATORY_CARE_PROVIDER_SITE_OTHER): Payer: BLUE CROSS/BLUE SHIELD | Admitting: Family Medicine

## 2017-02-13 ENCOUNTER — Encounter: Payer: Self-pay | Admitting: Family Medicine

## 2017-02-13 VITALS — BP 116/80 | HR 81 | Temp 98.3°F | Ht 72.25 in | Wt 212.1 lb

## 2017-02-13 DIAGNOSIS — Z0001 Encounter for general adult medical examination with abnormal findings: Secondary | ICD-10-CM

## 2017-02-13 DIAGNOSIS — G8929 Other chronic pain: Secondary | ICD-10-CM

## 2017-02-13 DIAGNOSIS — E538 Deficiency of other specified B group vitamins: Secondary | ICD-10-CM

## 2017-02-13 DIAGNOSIS — Z0000 Encounter for general adult medical examination without abnormal findings: Secondary | ICD-10-CM

## 2017-02-13 DIAGNOSIS — M545 Low back pain: Secondary | ICD-10-CM | POA: Diagnosis not present

## 2017-02-13 LAB — LIPID PANEL
Cholesterol: 208 mg/dL — ABNORMAL HIGH (ref 0–200)
HDL: 45.6 mg/dL (ref >39.00)
NONHDL: 161.91
Total CHOL/HDL Ratio: 5
Triglycerides: 246 mg/dL — ABNORMAL HIGH (ref 0.0–149.0)
VLDL: 49.2 mg/dL — ABNORMAL HIGH (ref 0.0–40.0)

## 2017-02-13 LAB — LDL CHOLESTEROL, DIRECT: LDL DIRECT: 119 mg/dL

## 2017-02-13 LAB — HEMOGLOBIN A1C: Hgb A1c MFr Bld: 5.4 % (ref 4.6–6.5)

## 2017-02-13 LAB — VITAMIN B12

## 2017-02-13 NOTE — Telephone Encounter (Signed)
° ° ° °  Pt did contact his insurance company and they will pay for osteopathic manipulative medicine, but only after he has met his deductible and he would like to know what the price would be . Marland Kitchen    [phone]

## 2017-02-13 NOTE — Patient Instructions (Signed)
BEFORE YOU LEAVE: -Labs   Check with insurance about osteopathic manipulative medicine, then, if you wish schedule a 30 minute osteopathic treatment session at the beginning or end of one of my clinics.  Source naturals vitamin D3 drops. 1000 international units daily.  We have ordered labs or studies at this visit. It can take up to 1-2 weeks for results and processing. IF results require follow up or explanation, we will call you with instructions. Clinically stable results will be released to your [facility]. If you have not heard from [place] or cannot find your results in [facility] in 2 weeks please contact our office at [phone].  If you are not yet signed up for [facility], please consider signing up.   We recommend the following healthy lifestyle for LIFE: 1) Small portions. Regular healthy meals.  2) Eat a healthy clean diet.   TRY TO EAT: -at least 5-7 servings of low sugar vegetables per day (not corn, potatoes or bananas.) -berries are the best choice if you wish to eat fruit.   -lean meets (fish, chicken or [place] breasts) -vegan proteins for some meals - beans or tofu, whole grains, nuts and seeds -Replace bad fats with good fats - good fats include: fish, nuts and seeds, canola oil, olive oil -small amounts of low fat or non fat dairy -small amounts of100 % whole grains - check the lables  AVOID: -SUGAR, sweets, anything with added sugar, corn syrup or sweeteners -if you must have a sweetener, small amounts of stevia may be best -sweetened beverages -simple starches (rice, bread, potatoes, pasta, chips, etc - small amounts of 100% whole grains are ok) -red meat, pork, butter -fried foods, fast food, processed food, excessive dairy, eggs and coconut.  3)Get at least 150 minutes of sweaty aerobic exercise per week.  4)Reduce stress - consider counseling, meditation and relaxation to balance other aspects of your life.   Health Maintenance, Male A healthy lifestyle and  preventive care is important for your health and wellness. Ask your health care provider about what schedule of regular examinations is right for you. What should I know about weight and diet? Eat a Healthy Diet  Eat plenty of vegetables, fruits, whole grains, low-fat dairy products, and lean protein.  Do not eat a lot of foods high in solid fats, added sugars, or salt.  Maintain a Healthy Weight Regular exercise can help you achieve or maintain a healthy weight. You should:  Do at least 150 minutes of exercise each week. The exercise should increase your heart rate and make you sweat (moderate-intensity exercise).  Do strength-training exercises at least twice a week.  Watch Your Levels of Cholesterol and Blood Lipids  Have your blood tested for lipids and cholesterol every 5 years starting at 33 years of age. If you are at high risk for heart disease, you should start having your blood tested when you are 33 years old. You may need to have your cholesterol levels checked more often if: ? Your lipid or cholesterol levels are high. ? You are older than 33 years of age. ? You are at high risk for heart disease.  What should I know about cancer screening? Many types of cancers can be detected early and may often be prevented. Lung Cancer  You should be screened every year for lung cancer if: ? You are a current smoker who has smoked for at least 30 years. ? You are a former smoker who has quit within the past 15 years.  Talk to your health care provider about your screening options, when you should start screening, and how often you should be screened.  Colorectal Cancer  Routine colorectal cancer screening usually begins at 33 years of age and should be repeated every 5-10 years until you are 33 years old. You may need to be screened more often if early forms of precancerous polyps or small growths are found. Your health care provider may recommend screening at an earlier age if you  have risk factors for colon cancer.  Your health care provider may recommend using home test kits to check for hidden blood in the stool.  A small camera at the end of a tube can be used to examine your colon (sigmoidoscopy or colonoscopy). This checks for the earliest forms of colorectal cancer.  Prostate and Testicular Cancer  Depending on your age and overall health, your health care provider may do certain tests to screen for prostate and testicular cancer.  Talk to your health care provider about any symptoms or concerns you have about testicular or prostate cancer.  Skin Cancer  Check your skin from head to toe regularly.  Tell your health care provider about any new moles or changes in moles, especially if: ? There is a change in a mole's size, shape, or color. ? You have a mole that is larger than a pencil eraser.  Always use sunscreen. Apply sunscreen liberally and repeat throughout the day.  Protect yourself by wearing long sleeves, pants, a wide-brimmed hat, and sunglasses when outside.  What should I know about heart disease, diabetes, and high blood pressure?  If you are 218-33 years of age, have your blood pressure checked every 3-5 years. If you are 33 years of age or older, have your blood pressure checked every year. You should have your blood pressure measured twice-once when you are at a hospital or clinic, and once when you are not at a hospital or clinic. Record the average of the two measurements. To check your blood pressure when you are not at a hospital or clinic, you can use: ? An automated blood pressure machine at a pharmacy. ? A home blood pressure monitor.  Talk to your health care provider about your target blood pressure.  If you are between 10845-33 years old, ask your health care provider if you should take aspirin to prevent heart disease.  Have regular diabetes screenings by checking your fasting blood sugar level. ? If you are at a normal weight and  have a low risk for diabetes, have this test once every three years after the age of 545. ? If you are overweight and have a high risk for diabetes, consider being tested at a younger age or more often.  A one-time screening for abdominal aortic aneurysm (AAA) by ultrasound is recommended for men aged 65-75 years who are current or former smokers. What should I know about preventing infection? Hepatitis B If you have a higher risk for hepatitis B, you should be screened for this virus. Talk with your health care provider to find out if you are at risk for hepatitis B infection. Hepatitis C Blood testing is recommended for:  Everyone born from [id number] through 1967.  Anyone with known risk factors for hepatitis C.  Sexually Transmitted Diseases (STDs)  You should be screened each year for STDs including gonorrhea and chlamydia if: ? You are sexually active and are younger than 33 years of age. ? You are older than 33 years of  age and your health care provider tells you that you are at risk for this type of infection. ? Your sexual activity has changed since you were last screened and you are at an increased risk for chlamydia or gonorrhea. Ask your health care provider if you are at risk.  Talk with your health care provider about whether you are at high risk of being infected with HIV. Your health care provider may recommend a prescription medicine to help prevent HIV infection.  What else can I do?  Schedule regular health, dental, and eye exams.  Stay current with your vaccines (immunizations).  Do not use any tobacco products, such as cigarettes, chewing tobacco, and e-cigarettes. If you need help quitting, ask your health care provider.  Limit alcohol intake to no more than 2 drinks per day. One drink equals 12 ounces of beer, 5 ounces of wine, or 1 ounces of hard liquor.  Do not use street drugs.  Do not share needles.  Ask your health care provider for help if you need support  or information about quitting drugs.  Tell your health care provider if you often feel depressed.  Tell your health care provider if you have ever been abused or do not feel safe at home. This information is not intended to replace advice given to you by your health care provider. Make sure you discuss any questions you have with your health care provider. Document Released: 01/10/2008 Document Revised: 03/12/2016 Document Reviewed: 04/17/2015 Elsevier Interactive Patient Education  [employer].

## 2017-02-16 NOTE — Telephone Encounter (Signed)
Carollee HerterShannon,  I am not sure what the cost is if paid out of pocket? Can we check on this? I know Dr. Katrinka BlazingSmith and Dr. Berline Choughigby do quite a bit of this type of treatment - perhaps their offices may know?

## 2017-02-24 NOTE — Telephone Encounter (Signed)
It depends on how many areas treated and then how much insurance covers.  Most of the time people have no trouble with it.  Also depends on their deductible  We charge somewhere between 37-112 but know idea what we get back and true charge. This does not include the office visit.

## 2017-02-24 NOTE — Telephone Encounter (Signed)
Do you have an estimated charge for Osteopathic Manipulative Therapy?  We have a patient inquiring about this method of treatment and we were hoping you would be able to provide some insight on this.  Thank you!

## 2017-02-25 NOTE — Telephone Encounter (Signed)
When I spoke to him I did initially try to schedule with you.  Once I secured a date he asked where we are located.  When I advised that it was at "[facility] with Dr. Selena BattenKim" he stated he thought he was being referred out.  Which do you prefer?  I can schedule him with you for 8/9 at the end of your day and use 2 of the "fast track" appointments or I can set him up with Dr. Berline Choughigby.

## 2017-02-25 NOTE — Telephone Encounter (Signed)
Called and spoke to patient regarding the information provided.  Patient states that he knows he will have to meet his $400 deductible but he wants to get moving on this as soon as he can.  I had previously reached out to the offices of Dr. Katrinka BlazingSmith and Dr. Berline Choughigby with no luck so I sent the message directly to them.  I explained to the patient that there are so many variables that factor in to the overall expense but did give him a general estimate.  The patient states he would like to move forward with this and states his understanding is that he will be sent to either Dr. Katrinka BlazingSmith or Dr. Berline Choughigby.  Can you advise and then I will schedule accordingly?

## 2017-02-25 NOTE — Telephone Encounter (Signed)
I think he was planning to see me for OMT? If so, please schedule at the very beginning or end of my clinic - 30 minutes. If he prefers to see one of the sports med docs for this ok to refer. Thanks.

## 2017-02-25 NOTE — Telephone Encounter (Signed)
If sounds like he wants to see sports medicine then ok to set up with Dr. Berline Strong. We did discuss this being an option and it may be good given his ongoing issues. At his appt he had said he wanted to do OMT with me first - thus the confusion. But he may have changed his mind. Please refer and set up with Jonathan Strong and put in referral notes that he is also interested in OMT. Thanks!

## 2017-03-09 ENCOUNTER — Encounter: Payer: Self-pay | Admitting: Sports Medicine

## 2017-03-09 ENCOUNTER — Ambulatory Visit (INDEPENDENT_AMBULATORY_CARE_PROVIDER_SITE_OTHER): Payer: BLUE CROSS/BLUE SHIELD | Admitting: Sports Medicine

## 2017-03-09 VITALS — BP 112/80 | HR 88 | Ht 72.25 in | Wt 213.4 lb

## 2017-03-09 DIAGNOSIS — M9905 Segmental and somatic dysfunction of pelvic region: Secondary | ICD-10-CM | POA: Diagnosis not present

## 2017-03-09 DIAGNOSIS — M9902 Segmental and somatic dysfunction of thoracic region: Secondary | ICD-10-CM

## 2017-03-09 DIAGNOSIS — M5442 Lumbago with sciatica, left side: Secondary | ICD-10-CM

## 2017-03-09 DIAGNOSIS — M5136 Other intervertebral disc degeneration, lumbar region: Secondary | ICD-10-CM | POA: Diagnosis not present

## 2017-03-09 DIAGNOSIS — M9904 Segmental and somatic dysfunction of sacral region: Secondary | ICD-10-CM | POA: Diagnosis not present

## 2017-03-09 DIAGNOSIS — M9903 Segmental and somatic dysfunction of lumbar region: Secondary | ICD-10-CM | POA: Diagnosis not present

## 2017-03-09 DIAGNOSIS — M51369 Other intervertebral disc degeneration, lumbar region without mention of lumbar back pain or lower extremity pain: Secondary | ICD-10-CM

## 2017-03-09 DIAGNOSIS — M9908 Segmental and somatic dysfunction of rib cage: Secondary | ICD-10-CM | POA: Diagnosis not present

## 2017-03-09 DIAGNOSIS — G8929 Other chronic pain: Secondary | ICD-10-CM

## 2017-03-09 NOTE — Patient Instructions (Signed)
Also check out the YouTube Video from Dr. Myles LippsEric Goodman.  I would like to see you try performing this 5-6 days per week.    A good intro video is: "Independence from Pain 7-minute Video" - [email or web address]   His more advanced video is: "Powerful Posture and Pain Relief: 12 minutes of Foundation Training" - https://youtu.be/[email or web address]   Do not try to attempt this entire video when first beginning.    Try breaking of each exercise that he goes into shorter segments.  Otherwise if they perform an exercise for 45 seconds, start with 15 seconds and rest and then resume with a begin the new activity.  Work your way up to doing this 12 minute video and I expect to see significant improvements in your pain.  Also check out the Felicity Pellegrinied talk that he gives.  Be sure to do these after you cycle as well as first thing in the morning every day.
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
## 2017-03-09 NOTE — Assessment & Plan Note (Signed)
Symptoms are symptoms are consistent with mechanical low back pain with intermittent exacerbation secondary to degenerative disc disease as evidenced by prior MRI per patient report.  Gentle osteopathic manipulation including long lever performed today with good improvement in low back pain and thoracic mobility.  Ultimately he has markedly tight hip flexors left greater than right with a predominant anterior chain motion pattern.  Patient has been performing physical therapy exercises but have not given any significant meaningful long-lasting relief.  We will have him try working on his kinetic chain with foundations training exercises and plan to follow-up with him in 3 weeks for consideration of repeat osteopathic manipulation.

## 2017-03-12 NOTE — Procedures (Signed)
PROCEDURE NOTE : OSTEOPATHIC MANIPULATION The decision today to treat with Osteopathic Manipulative Therapy (OMT) was based on physical exam findings. Verbal consent was obtained after after explanation of risks, benefits and potential side effects, including acute pain flare, post manipulation soreness and need for repeat treatments.  If Cervical manipulation was performed additional time was spent discussing the associated minimal risk of  injury to neurovascular structures.  After consent was obtained manipulation was performed as below:            Regions treated:  Per billing codes          Techniques used:  Direct, Muscle Energy, MFR and HVLA The patient tolerated the treatment well and reported Improved symptoms following treatment today. Patient was given medications, exercises, stretches and lifestyle modifications per AVS and verbally.    

## 2017-03-31 ENCOUNTER — Encounter: Payer: Self-pay | Admitting: Sports Medicine

## 2017-03-31 ENCOUNTER — Ambulatory Visit (INDEPENDENT_AMBULATORY_CARE_PROVIDER_SITE_OTHER): Payer: BLUE CROSS/BLUE SHIELD | Admitting: Sports Medicine

## 2017-03-31 VITALS — BP 122/82 | HR 80 | Ht 72.25 in | Wt 213.6 lb

## 2017-03-31 DIAGNOSIS — M545 Low back pain: Secondary | ICD-10-CM | POA: Diagnosis not present

## 2017-03-31 DIAGNOSIS — G8929 Other chronic pain: Secondary | ICD-10-CM | POA: Diagnosis not present

## 2017-03-31 DIAGNOSIS — M9908 Segmental and somatic dysfunction of rib cage: Secondary | ICD-10-CM | POA: Diagnosis not present

## 2017-03-31 DIAGNOSIS — M9906 Segmental and somatic dysfunction of lower extremity: Secondary | ICD-10-CM | POA: Diagnosis not present

## 2017-03-31 DIAGNOSIS — M9903 Segmental and somatic dysfunction of lumbar region: Secondary | ICD-10-CM

## 2017-03-31 DIAGNOSIS — M9904 Segmental and somatic dysfunction of sacral region: Secondary | ICD-10-CM | POA: Diagnosis not present

## 2017-03-31 DIAGNOSIS — M9902 Segmental and somatic dysfunction of thoracic region: Secondary | ICD-10-CM | POA: Diagnosis not present

## 2017-03-31 NOTE — Patient Instructions (Signed)
I recommend that you obtain over-the-counter SOLE  medium cushioned insoles.  These can be found at [employer] - or on-line at [employer].com  Search for "SOLE Active Medium Shoe Insoles"  

## 2017-03-31 NOTE — Progress Notes (Signed)
OFFICE VISIT NOTE [name] [name] Sports Medicine [facility] at [facility] [phone]  Millan Otts - 31 y.o. male MRN [id number]  Date of birth: July 03, 1986  Visit Date: 03/31/2017  PCP: Terressa Koyanagi, DO   Referred by: Terressa Koyanagi, DO  Orlie Dakin, CMA acting as scribe for Dr. Berline Chough.  SUBJECTIVE:   Chief Complaint  Patient presents with  . Follow-up    chronic bilateral low back pain with LT sided sciatica   HPI: As below and per problem based documentation when appropriate.  [name] is an established patient presenting today in follow-up of chronic bilateral low back pain with left sided sciatica. He was last seen 03/09/17 and reports some improvement with low back pain. He did notice a lot of tightness in the mid back. He reports that sciatica has resolved. He has continued nid back pain which is a little worse than it was before. He feels like he now has decreased ROM. He has also developed pain in the abd near the sternum. The pain is constant. He denies n/v. Pain is worse when trying to take deep breaths, feels restricted. Pt reports that when he stretches his arms overhead he feels like someone is thumbing him on random places of his back.     Review of Systems  Constitutional: Negative for chills and fever.  Respiratory: Negative for shortness of breath and wheezing.   Cardiovascular: Negative for chest pain and palpitations.  Musculoskeletal: Negative for falls.  Neurological: Negative for dizziness and headaches.  Endo/Heme/Allergies: Does not bruise/bleed easily.    Otherwise per HPI.  HISTORY & PERTINENT PRIOR DATA:  No specialty comments available. He reports that he has quit smoking. He has never used smokeless tobacco.   Recent Labs  02/13/17 0915  HGBA1C 5.4   Medications & Allergies reviewed per EMR Patient Active Problem List   Diagnosis Date Noted  . Degeneration of lumbar intervertebral disc 03/09/2017    . Chronic back pain, Low Back Pain 10/13/2014   Past Medical History:  Diagnosis Date  . History of chicken pox   . History of mononucleosis    treated at [facility] 3 weeks ago per patient  . Hyperlipidemia    Family History  Problem Relation Age of Onset  . Mental illness Brother   . Bipolar disorder Brother   . Schizophrenia Father    No past surgical history on file. Social History   Occupational History  . Not on file.   Social History Main Topics  . Smoking status: Former Games developer  . Smokeless tobacco: Never Used  . Alcohol use No     Comment: ocassional  . Drug use: No  . Sexual activity: Not on file    OBJECTIVE:  VS:  HT:6' 0.25" (183.5 cm)   WT:213 lb 9.6 oz (96.9 kg)  BMI:28.77    BP:122/82  HR:80bpm  TEMP: ( )  RESP:97 % EXAM: Findings:  Adult male.  No acute distress.  Alert and appropriate.  Anterior chain dominant sitting at rest.  He has markedly improved hip range of motion with markedly looser hip flexors.  Lower extremity sensation is intact to light touch.  His hips have overall normal internal and external rotation.  Mid back is only slightly tender to palpation in the paraspinal musculature.  No focal midline tenderness  OSTEOPATHIC/STRUCTURAL EXAM:   T1 ERS right T2 through T6 neutral rotated left side bent right Posterior ribs 6  through 8 on the right Inhalation dysfunction of the sternum with left inferior ribs exhaled L3 FRS right Left on left sacral torsion Right anterior innominate Right externally rotated hip Anterior fibular head on the right Dropped navicular on the right with hyperextended first MTP.       No results found. ASSESSMENT & PLAN:     ICD-10-CM   1. Chronic bilateral low back pain, with sciatica presence unspecified M54.5    G89.29   2. Segmental and somatic dysfunction of thoracic region M99.02 OSTEOPATHIC MANIPULATION TREATMENT  3. Segmental and somatic dysfunction of lumbar region M99.03 OSTEOPATHIC  MANIPULATION TREATMENT  4. Segmental and somatic dysfunction of sacral region M99.04 OSTEOPATHIC MANIPULATION TREATMENT  5. Segmental and somatic dysfunction of lower extremity M99.06 OSTEOPATHIC MANIPULATION TREATMENT  6. Segmental and somatic dysfunction of rib cage M99.08 OSTEOPATHIC MANIPULATION TREATMENT  ================================================================= Chronic back pain, Low Back Pain Therapeutic exercises with foundations training has been significantly beneficial for him.  He also reports good improvement in his symptoms following osteopathic manipulation at last visit.  We will go ahead and repeat this again today and plan to follow-up with him in 6 weeks.  Additionally his mid back pain is likely related to underlying poor thoracic mobility and once again persistent anterior chain dominance.  The slight discomfort that he is having in the epigastric area is likely related to contracture of the transversus thoracis muscles and this did improve with OMT today.  If any lack of improvement will consider further diagnostic evaluation but given the prior workup and overall good improvement with functional exercises we will defer.  He is not interested in physical therapy at this time.  Additionally he does have some underlying congenital pes cavus and cushioned long arch support would likely be beneficial for him information was provided per AVS. ================================================================= Patient Instructions  I recommend that you obtain over-the-counter SOLE  medium cushioned insoles.  These can be found at [employer] - or on-line at [employer]  Search for "SOLE Active Medium Shoe Insoles"  ================================================================= Future Appointments Date Time Provider Department Center  05/12/2017 8:20 AM Andrena Mews, DO [facility] None    Follow-up: Return in about 6 weeks (around 05/12/2017).   CMA/ATC served as  Neurosurgeon during this visit. History, Physical, and Plan performed by medical provider. Documentation and orders reviewed and attested to.      Gaspar Bidding, DO    [facility] Physician

## 2017-03-31 NOTE — Procedures (Signed)
PROCEDURE NOTE : OSTEOPATHIC MANIPULATION The decision today to treat with Osteopathic Manipulative Therapy (OMT) was based on physical exam findings. Verbal consent was obtained after after explanation of risks, benefits and potential side effects, including acute pain flare, post manipulation soreness and need for repeat treatments.  If Cervical manipulation was performed additional time was spent discussing the associated minimal risk of  injury to neurovascular structures.  After consent was obtained manipulation was performed as below:            Regions treated:  Per billing codes          Techniques used:  Muscle Energy, MFR, HVLA and ART The patient tolerated the treatment well and reported Improved symptoms following treatment today. Patient was given medications, exercises, stretches and lifestyle modifications per AVS and verbally.    

## 2017-03-31 NOTE — Assessment & Plan Note (Signed)
Therapeutic exercises with foundations training has been significantly beneficial for him.  He also reports good improvement in his symptoms following osteopathic manipulation at last visit.  We will go ahead and repeat this again today and plan to follow-up with him in 6 weeks.  Additionally his mid back pain is likely related to underlying poor thoracic mobility and once again persistent anterior chain dominance.  The slight discomfort that he is having in the epigastric area is likely related to contracture of the transversus thoracis muscles and this did improve with OMT today.  If any lack of improvement will consider further diagnostic evaluation but given the prior workup and overall good improvement with functional exercises we will defer.  He is not interested in physical therapy at this time.  Additionally he does have some underlying congenital pes cavus and cushioned long arch support would likely be beneficial for him information was provided per AVS.

## 2017-04-16 ENCOUNTER — Encounter: Payer: Self-pay | Admitting: Family Medicine

## 2017-04-20 ENCOUNTER — Encounter: Payer: Self-pay | Admitting: Sports Medicine

## 2017-04-20 ENCOUNTER — Ambulatory Visit (INDEPENDENT_AMBULATORY_CARE_PROVIDER_SITE_OTHER): Payer: BLUE CROSS/BLUE SHIELD

## 2017-04-20 ENCOUNTER — Ambulatory Visit (INDEPENDENT_AMBULATORY_CARE_PROVIDER_SITE_OTHER): Payer: BLUE CROSS/BLUE SHIELD | Admitting: Sports Medicine

## 2017-04-20 VITALS — BP 106/82 | HR 74 | Ht 72.25 in | Wt 212.4 lb

## 2017-04-20 DIAGNOSIS — M5136 Other intervertebral disc degeneration, lumbar region: Secondary | ICD-10-CM

## 2017-04-20 DIAGNOSIS — G8929 Other chronic pain: Secondary | ICD-10-CM | POA: Diagnosis not present

## 2017-04-20 DIAGNOSIS — M5442 Lumbago with sciatica, left side: Secondary | ICD-10-CM

## 2017-04-20 DIAGNOSIS — F439 Reaction to severe stress, unspecified: Secondary | ICD-10-CM | POA: Diagnosis not present

## 2017-04-20 DIAGNOSIS — M5441 Lumbago with sciatica, right side: Secondary | ICD-10-CM | POA: Diagnosis not present

## 2017-04-20 MED ORDER — METHYLPREDNISOLONE 4 MG PO TBPK: ORAL_TABLET | ORAL | 0 refills | Status: DC

## 2017-04-20 MED ORDER — GABAPENTIN 300 MG PO CAPS: ORAL_CAPSULE | ORAL | 1 refills | Status: DC

## 2017-04-20 NOTE — Progress Notes (Signed)
OFFICE VISIT NOTE Jonathan Strong. Jonathan Strong [facility] [phone]  Jonathan Strong - 31 y.o. male MRN [id number]  Date of birth: [birth year]  Visit Date: 04/20/2017  PCP: Terressa Koyanagi, DO   Referred by: Terressa Koyanagi, DO  Orlie Dakin, CMA  acting as scribe for Dr. Berline Chough.  SUBJECTIVE:   Chief Complaint  Patient presents with  . Follow-up    low back pain with sciatica   HPI: As below and per problem based documentation when appropriate.  [name] is a 31 y/o male presenting today w/ c/o of back pain. He was last seen 03/31/2017 and had OMT. He felt improvement in pain until last week when he had to drive to [place]. He reports that while he was driving his back seized up. He did a lot of sitting while he was in [place]. He feels a lot of tightness I his back. He has a lot of cramping in the RT leg, hip and glute. He has radiation of pain into both legs but RT > LT. He reports that he can barely move this RT leg without a lot of pain. He has a lot of pain despite changing positions. He has to use mostly his upper body to move. He did try soaking in a hot tub over the weekend with no relief. He also tried using ice with no relief. He has tried Ibuprofen with no relief. He denies loss of control of bladder or bowel functions.         Review of Systems  Constitutional: Negative for chills and fever.  Respiratory: Negative for shortness of breath and wheezing.   Cardiovascular: Negative for chest pain, palpitations and leg swelling.  Gastrointestinal: Negative.   Genitourinary: Negative.   Musculoskeletal: Positive for back pain and myalgias. Negative for falls.  Neurological: Negative for dizziness, tingling and headaches.  Endo/Heme/Allergies: Does not bruise/bleed easily.    Otherwise per HPI.  HISTORY & PERTINENT PRIOR DATA:  No specialty comments available. He reports that he has quit smoking. He has never used  smokeless tobacco.   Recent Labs  02/13/17 0915  HGBA1C 5.4   Medications & Allergies reviewed per EMR Patient Active Problem List   Diagnosis Date Noted  . Degeneration of lumbar intervertebral disc 03/09/2017  . Chronic back pain, Low Back Pain 10/13/2014   Past Medical History:  Diagnosis Date  . History of chicken pox   . History of mononucleosis    treated at [facility] 3 weeks ago per patient  . Hyperlipidemia    Family History  Problem Relation Age of Onset  . Mental illness Brother   . Bipolar disorder Brother   . Schizophrenia Father    No past surgical history on file. Social History   Occupational History  . Not on file.   Social History Main Topics  . Smoking status: Former Games developer  . Smokeless tobacco: Never Used  . Alcohol use No     Comment: ocassional  . Drug use: No  . Sexual activity: Not on file    OBJECTIVE:  VS:  HT:6' 0.25" (183.5 cm)   WT:212 lb 6.4 oz (96.3 kg)  BMI:28.61    BP:106/82  HR:74bpm  TEMP: ( )  RESP:96 % EXAM: Findings:  Markedly tight straight leg raise bilaterally with positive radicular symptoms and localization into the back with right greater than left SLR.  Lower extremity strength is 5 out of  5 manual muscle testing.  Lower extremity reflexes are symmetric.  Sensation is intact to light touch.     Dg Lumbar Spine Complete  Result Date: 04/20/2017 CLINICAL DATA:  Low back pain EXAM: LUMBAR SPINE - COMPLETE 4+ VIEW COMPARISON:  None. FINDINGS: Six non rib-bearing lumbar type vertebral bodies. Disc spaces are maintained. No fracture. SI joints are symmetric and unremarkable. IMPRESSION: No acute bony abnormality. Electronically Signed   By: Charlett Nose M.D.   On: 04/20/2017 11:23   ASSESSMENT & PLAN:     ICD-10-CM   1. Chronic bilateral low back pain with bilateral sciatica M54.42 DG Lumbar Spine Complete   M54.41 gabapentin (NEURONTIN) 300 MG capsule   G89.29 methylPREDNISolone (MEDROL DOSEPAK) 4 MG TBPK tablet  2.  Degeneration of lumbar intervertebral disc M51.36   3. Stress F43.9    May notice some benefit of gabapentin long-term.  Could consider chronic use if effective from an anxiety standpoint  ================================================================= Chronic back pain, Low Back Pain Acute flareup with recent trip to [place].  Does seem to be truly radicular in nature at this time.  Steroid Dosepak and gabapentin recommended and will plan to follow-up with him in short.  To consider repeat osteopathic manipulation.  He was improving with foundations training but has been unable to perform these due to worsening since his trip.  We will plan to have him consider formal physical therapy but he would like to try to resume exercises on his own at this time since he has been through this in the past without significant improvement.  Interestingly he does have 6 lumbar vertebrae so will need to take care with nomenclature for future interventions. =================================================================  Follow-up: Return in about 2 weeks (around 05/04/2017).   CMA/ATC served as Neurosurgeon during this visit. History, Physical, and Plan performed by medical provider. Documentation and orders reviewed and attested to.      Gaspar Bidding, DO    [facility] Physician

## 2017-04-20 NOTE — Assessment & Plan Note (Signed)
Acute flareup with recent trip to [place].  Does seem to be truly radicular in nature at this time.  Steroid Dosepak and gabapentin recommended and will plan to follow-up with him in short.  To consider repeat osteopathic manipulation.  He was improving with foundations training but has been unable to perform these due to worsening since his trip.  We will plan to have him consider formal physical therapy but he would like to try to resume exercises on his own at this time since he has been through this in the past without significant improvement.  Interestingly he does have 6 lumbar vertebrae so will need to take care with nomenclature for future interventions.

## 2017-05-05 ENCOUNTER — Telehealth: Payer: Self-pay | Admitting: Sports Medicine

## 2017-05-05 ENCOUNTER — Ambulatory Visit (INDEPENDENT_AMBULATORY_CARE_PROVIDER_SITE_OTHER): Payer: BLUE CROSS/BLUE SHIELD | Admitting: Sports Medicine

## 2017-05-05 ENCOUNTER — Encounter: Payer: Self-pay | Admitting: Sports Medicine

## 2017-05-05 VITALS — BP 164/100 | HR 76 | Ht 72.25 in | Wt 208.2 lb

## 2017-05-05 DIAGNOSIS — M545 Low back pain, unspecified: Secondary | ICD-10-CM

## 2017-05-05 DIAGNOSIS — G8929 Other chronic pain: Secondary | ICD-10-CM

## 2017-05-05 DIAGNOSIS — M5136 Other intervertebral disc degeneration, lumbar region: Secondary | ICD-10-CM | POA: Diagnosis not present

## 2017-05-05 DIAGNOSIS — M5441 Lumbago with sciatica, right side: Secondary | ICD-10-CM | POA: Diagnosis not present

## 2017-05-05 DIAGNOSIS — R202 Paresthesia of skin: Secondary | ICD-10-CM | POA: Diagnosis not present

## 2017-05-05 DIAGNOSIS — M5442 Lumbago with sciatica, left side: Secondary | ICD-10-CM | POA: Diagnosis not present

## 2017-05-05 NOTE — Telephone Encounter (Signed)
Question about blood pressure and gabapentin.  Ty,  -LL

## 2017-05-05 NOTE — Assessment & Plan Note (Signed)
Multifactorial low back pain.  He does have a lumbarized S1 and I suspect that he has poor neuromuscular control at creating an anterior pelvic tilt.  Recommend continuing with therapeutic exercises including foundations training as well as referral placed back to physical therapy to focus on postural reconditioning and hip hinging activities.  No true radicular symptoms but could consider repeat MRI although he has been through this before and had no significant benefit.     we did discuss using a functional back brace with compression and additional acupressure points in the low paraspinal musculature.  He will try to see how this works and if he feels good improvement will do his exercises with this on as well as utilize it when he is in a seated position in a long time in the car as this is when he seems to be exacerbated.  >50% of this 30 minute visit spent in direct patient counseling and/or coordination of care.  Discussion was focused on education regarding the in discussing the pathoetiology and anticipated clinical course of the above condition.

## 2017-05-05 NOTE — Progress Notes (Signed)
OFFICE VISIT NOTE Jonathan Strong. Jonathan Strong Sports Medicine [facility] at [facility] [phone]  Jonathan Strong - 31 y.o. male MRN [id number]  Date of birth: July 26, 1986  Visit Date: 05/05/2017  PCP: Jonathan Koyanagi, DO   Referred by: Jonathan Koyanagi, DO  Orlie Dakin, CMA acting as scribe for Dr. Berline Strong.  SUBJECTIVE:   Chief Complaint  Patient presents with  . Follow-up    bilateral low back pain   HPI: As below and per problem based documentation when appropriate.  Jonathan Strong is an established patient presenting today in follow-up of chronic bilateral low back pain. He was last seen 04/20/17 and was prescribed Gabapentin and Medrol Dosepak. He has received OMT for this issue in the past.   Pt has been taking Gabapentin TID. Initially the medication was causes a lot of drowsiness but that has resolved. He has continued low back pain and decreased ROM d/t driving a lot. Pain has improved since his last visit. Pain gets worse when lying or sitting down for prolonged periods of time. He has completed the steroid dosepak and got some relief from that. He still has some trouble with pain radiating into his legs which seems to be worse after sitting. He has noticed that he is having increased trouble sleeping since starting Gabapentin.     Review of Systems  Constitutional: Negative for chills and fever.  Respiratory: Negative for shortness of breath and wheezing.   Cardiovascular: Negative for chest pain and palpitations.  Psychiatric/Behavioral: The patient has insomnia.     Otherwise per HPI.  HISTORY & PERTINENT PRIOR DATA:  No specialty comments available. He reports that he has quit smoking. He has never used smokeless tobacco.   Recent Labs  02/13/17 0915  HGBA1C 5.4   Allergies reviewed per EMR Prior to Admission medications   Medication Sig Start Date End Date Taking? Authorizing Provider  gabapentin (NEURONTIN) 300 MG capsule Start with 1 tab  po qhs X 1 week, then increase to 1 tab po bid X 1 week then 1 tab po tid prn Patient taking differently: take 1 tab po tid prn 04/20/17  Yes Andrena Mews, DO   Patient Active Problem List   Diagnosis Date Noted  . Degeneration of lumbar intervertebral disc 03/09/2017  . Chronic back pain, Low Back Pain 10/13/2014   Past Medical History:  Diagnosis Date  . History of chicken pox   . History of mononucleosis    treated at [facility] 3 weeks ago per patient  . Hyperlipidemia    Family History  Problem Relation Age of Onset  . Mental illness Brother   . Bipolar disorder Brother   . Schizophrenia Father    No past surgical history on file. Social History   Occupational History  . Not on file.   Social History Main Topics  . Smoking status: Former Games developer  . Smokeless tobacco: Never Used  . Alcohol use No     Comment: ocassional  . Drug use: No  . Sexual activity: Not on file    OBJECTIVE:  VS:  HT:6' 0.25" (183.5 cm)   WT:208 lb 3.2 oz (94.4 kg)  BMI:28.05    BP:(!) 164/100  HR:76bpm  TEMP: ( )  RESP:96 % EXAM: Findings:  Patient overall significantly more comfortable than at last office visit.  He is in no acute distress.  He walks normally.  He has a small amount of pain with straight  leg raise that localizes to the upper buttock but no true radicular symptoms.  He is able to heel and toe walk without difficulty.  He has a significantly anterior pelvic.  He is only minimal pain with palpation of the low paraspinal musculature.  ASIS compression test are open and symmetric.  No significant pain with palpation of the greater sciatic notch or popliteal compression test.    RADIOLOGY: DG Lumbar Spine Complete CLINICAL DATA:  Low back pain  EXAM: LUMBAR SPINE - COMPLETE 4+ VIEW  COMPARISON:  None.  FINDINGS: Six non rib-bearing lumbar type vertebral bodies. Disc spaces are maintained. No fracture. SI joints are symmetric and unremarkable.  IMPRESSION: No acute  bony abnormality.  Electronically Signed   By: Charlett Nose M.D.   On: 04/20/2017 11:23  ASSESSMENT & PLAN:     ICD-10-CM   1. Chronic bilateral low back pain without sciatica M54.5 Ambulatory referral to Physical Therapy   G89.29   2. Chronic bilateral low back pain with bilateral sciatica M54.42    M54.41    G89.29   3. Degeneration of lumbar intervertebral disc M51.36   4. Paresthesia R20.2    ================================================================= Chronic back pain, Low Back Pain Multifactorial low back pain.  He does have a lumbarized S1 and I suspect that he has poor neuromuscular control at creating an anterior pelvic tilt.  Recommend continuing with therapeutic exercises including foundations training as well as referral placed back to physical therapy to focus on postural reconditioning and hip hinging activities.  No true radicular symptoms but could consider repeat MRI although he has been through this before and had no significant benefit.     we did discuss using a functional back brace with compression and additional acupressure points in the low paraspinal musculature.  He will try to see how this works and if he feels good improvement will do his exercises with this on as well as utilize it when he is in a seated position in a long time in the car as this is when he seems to be exacerbated.  >50% of this 30 minute visit spent in direct patient counseling and/or coordination of care.  Discussion was focused on education regarding the in discussing the pathoetiology and anticipated clinical course of the above condition.  Degeneration of lumbar intervertebral disc Radicular symptoms have improved.  I would like for him to continue with the gabapentin until I see him back as he begins working on further therapeutic exercises.  If any worsening or persistent lack of improvement will need repeat  MRI  =================================================================  Follow-up: Return in about 6 weeks (around 06/16/2017).   CMA/ATC served as Neurosurgeon during this visit. History, Physical, and Plan performed by medical provider. Documentation and orders reviewed and attested to.      Jonathan Bidding, DO    [facility] Phys[facility]

## 2017-05-05 NOTE — Assessment & Plan Note (Signed)
Radicular symptoms have improved.  I would like for him to continue with the gabapentin until I see him back as he begins working on further therapeutic exercises.  If any worsening or persistent lack of improvement will need repeat MRI

## 2017-05-05 NOTE — Telephone Encounter (Signed)
Called pt and left VM to call the office.  

## 2017-05-06 NOTE — Telephone Encounter (Signed)
Spoke with pt, he was wondering if Gabapentin can cause BP to be elevated. He will come in today to have BP rechecked.

## 2017-05-12 ENCOUNTER — Ambulatory Visit: Payer: BLUE CROSS/BLUE SHIELD | Admitting: Sports Medicine

## 2017-05-18 ENCOUNTER — Ambulatory Visit (INDEPENDENT_AMBULATORY_CARE_PROVIDER_SITE_OTHER): Payer: BLUE CROSS/BLUE SHIELD | Admitting: Physical Therapy

## 2017-05-18 VITALS — BP 124/78

## 2017-05-18 DIAGNOSIS — M545 Low back pain, unspecified: Secondary | ICD-10-CM

## 2017-05-18 DIAGNOSIS — R293 Abnormal posture: Secondary | ICD-10-CM | POA: Diagnosis not present

## 2017-05-18 DIAGNOSIS — M791 Myalgia, unspecified site: Secondary | ICD-10-CM | POA: Diagnosis not present

## 2017-05-18 DIAGNOSIS — G8929 Other chronic pain: Secondary | ICD-10-CM | POA: Diagnosis not present

## 2017-05-18 NOTE — Patient Instructions (Signed)
Hamstring Step 2    Left foot relaxed, knee straight, other leg bent, foot flat. Raise straight leg further upward to maximal range. Hold _30_ seconds. Relax leg completely down.  Repeat with other leg. Repeat _2-3__ times.  Perform 2-3 sessions per day.   Extensors / Rotators, Supine    Lie supine, one leg straight, other leg bent, knee held by opposite hand. Gently pull knee toward opposite shoulder. Feel stretch in buttocks and outside of hip. Hold __30_ seconds.  Repeat to other side. Repeat __2-3_ times per session. Do _2-3__ sessions per day.   Pelvic Tilt    Flatten back by tightening stomach muscles and buttocks.  Hold for 5 seconds. Repeat _10___ times per set. Do _1__ sets per session. Do __2-3__ sessions per day.                 Pelvic Tilt    Lie on back, legs bent. Exhale, tilting top of pelvis back, pubic bone up, to flatten lower back. Inhale, rolling pelvis opposite way, top forward, pubic bone down, arch in back. Repeat _10___ times. Do __2-3__ sessions per day.    Trigger Point Dry Needling  . What is Trigger Point Dry Needling (DN)? o DN is a physical therapy technique used to treat muscle pain and dysfunction. Specifically, DN helps deactivate muscle trigger points (muscle knots).  o A thin filiform needle is used to penetrate the skin and stimulate the underlying trigger point. The goal is for a local twitch response (LTR) to occur and for the trigger point to relax. No medication of any kind is injected during the procedure.   . What Does Trigger Point Dry Needling Feel Like?  o The procedure feels different for each individual patient. Some patients report that they do not actually feel the needle enter the skin and overall the process is not painful. Very mild bleeding may occur. However, many patients feel a deep cramping in the muscle in which the needle was inserted. This is the local twitch response.   Marland Kitchen. How Will I feel after the  treatment? o Soreness is normal, and the onset of soreness may not occur for a few hours. Typically this soreness does not last longer than two days.  o Bruising is uncommon, however; ice can be used to decrease any possible bruising.  o In rare cases feeling tired or nauseous after the treatment is normal. In addition, your symptoms may get worse before they get better, this period will typically not last longer than 24 hours.   . What Can I do After My Treatment? o Increase your hydration by drinking more water for the next 24 hours. o You may place ice or heat on the areas treated that have become sore, however, do not use heat on inflamed or bruised areas. Heat often brings more relief post needling. o You can continue your regular activities, but vigorous activity is not recommended initially after the treatment for 24 hours. o DN is best combined with other physical therapy such as strengthening, stretching, and other therapies.

## 2017-05-18 NOTE — Therapy (Signed)
abnormal posture affecting functional mobilty.  Pt will benefit from PT to address deficits.   Clinical Presentation Stable   Clinical Decision Making Low   Rehab Potential Good   PT Frequency 1x / week   PT Duration 6 weeks   PT Treatment/Interventions ADLs/Self Care Home Management;Cryotherapy;Electrical Stimulation;Moist Heat;Ultrasound;Therapeutic exercise;Therapeutic activities;Functional mobility training;Patient/family education;Dry needling;Taping;Manual techniques   PT Next Visit Plan review HEP, continue flexibility and core/hip stability   Consulted and Agree with Plan of Care Patient      Patient will benefit from skilled therapeutic intervention in order to improve the following deficits and impairments:  Decreased range of motion, Impaired flexibility, Postural dysfunction, Increased muscle spasms, Increased fascial restricitons, Pain, Decreased strength  Visit Diagnosis: Chronic midline low back pain without sciatica - Plan: PT plan of care cert/re-cert  Abnormal posture - Plan: PT plan of care cert/re-cert  Myalgia - Plan: PT plan of care cert/re-cert     Problem List Patient Active Problem List   Diagnosis Date Noted  . Degeneration of lumbar intervertebral disc 03/09/2017  . Chronic back pain, Low Back Pain 10/13/2014      Clarita Crane, PT, DPT 05/18/17 12:34 PM     [place] [address] [place], [place], [address] Phone: [phone]   Fax:  [phone]  Name: Jonathan Strong MRN: [id number] Date of Birth: 1986-03-25  [facility] [address] [place], [place], [address] Phone: [phone]   Fax:  [phone]  Physical Therapy Evaluation  Patient Details  Name: Jonathan Strong MRN: [id number] Date of Birth: 1985/11/04 Referring Provider: Dr. Gaspar BiddingMichael Rigby  Encounter Date: 05/18/2017      PT End of Session - 05/18/17 1228    Visit Number 1   Number of Visits 6   Date for PT Re-Evaluation 06/29/17   Authorization Type BCBS-$20 copay; 28 visits remaining   Authorization - Number of Visits 28   PT Start Time 1015   PT Stop Time 1059   PT Time Calculation (min) 44 min   Activity Tolerance Patient tolerated treatment well   Behavior [facility] Therapy [facility] for tasks assessed/performed      Past Medical History:  Diagnosis Date  . History of chicken pox   . History of mononucleosis    [facility] per patient  . Hyperlipidemia     No past surgical history on file.  Vitals:   05/18/17 1100  BP: 124/78         Subjective Assessment - 05/18/17 1018    Subjective Pt is a 31 y/o male who presents to OPPT for chronic LBP x ~ 20 years.  Pt reports pain progressively gotten worse over past 5 years.  Pt states LSO provided by DO is helpful with driving.  Pt states 2 week history of cramping sensation down RLE.   Limitations Sitting;Lifting   How long can you sit comfortably? "couple minutes"   Patient Stated Goals develop exercise routine to help decrease symptoms   Currently in Pain? Yes   Pain Score 0-No pain  up to 9/10   Pain Location Back   Pain Orientation Lower;Right   Pain Descriptors / Indicators Cramping   Pain Type Chronic pain   Pain Onset More than a month ago   Pain Frequency Intermittent   Aggravating Factors  extension, sitting   Pain Relieving Factors walking, movement            [facility] - 05/18/17 1024      Assessment   Medical Diagnosis LBP   Referring Provider Dr. Gaspar BiddingMichael Rigby   Onset Date/Surgical Date --  20 years with 5 year exacerbation   Next MD Visit 06/15/17   Prior Therapy 2 years ago for same condition     Precautions   Precautions None   Required Braces or Orthoses Spinal Brace   Spinal Brace Lumbar corset;Other (comment)  PRN when driving and sitting     Restrictions   Weight Bearing Restrictions No     Balance Screen   Has the patient fallen in the past 6 months No   Has the patient had a decrease in activity level because of a fear of falling?  No   Is the patient reluctant to leave their home because of a fear of falling?  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 Gaspar BiddingMichael Rigby  Encounter Date: 05/18/2017      PT End of Session - 05/18/17 1228    Visit Number 1   Number of Visits 6   Date for PT Re-Evaluation 06/29/17   Authorization Type BCBS-$20 copay; 28 visits remaining   Authorization - Number of Visits 28   PT Start Time 1015   PT Stop Time 1059   PT Time Calculation (min) 44 min   Activity Tolerance Patient tolerated treatment well   Behavior During Therapy [facility] for tasks assessed/performed      Past Medical History:  Diagnosis Date  . History of chicken pox   . History of mononucleosis    [facility] per patient  . Hyperlipidemia     No past surgical history on file.  Vitals:   05/18/17 1100  BP: 124/78         Subjective Assessment - 05/18/17 1018    Subjective Pt is a 31 y/o male who presents to OPPT for chronic LBP x ~ 20 years.  Pt reports pain progressively gotten worse over past 5 years.  Pt states LSO provided by DO is helpful with driving.  Pt states 2 week history of cramping sensation down RLE.   Limitations Sitting;Lifting   How long can you sit comfortably? "couple minutes"   Patient Stated Goals develop exercise routine to help decrease symptoms   Currently in Pain? Yes   Pain Score 0-No pain  up to 9/10   Pain Location Back   Pain Orientation Lower;Right   Pain Descriptors / Indicators Cramping   Pain Type Chronic pain   Pain Onset More than a month ago   Pain Frequency Intermittent   Aggravating Factors  extension, sitting   Pain Relieving Factors walking, movement            [facility] Assessment - 05/18/17 1024      Assessment   Medical Diagnosis LBP   Referring Provider Dr. Gaspar BiddingMichael Rigby   Onset Date/Surgical Date --  20 years with 5 year exacerbation   Next MD Visit 06/15/17   Prior Therapy 2 years ago for same condition     Precautions   Precautions None   Required Braces or Orthoses Spinal Brace   Spinal Brace Lumbar corset;Other (comment)  PRN when driving and sitting     Restrictions   Weight Bearing Restrictions No     Balance Screen   Has the patient fallen in the past 6 months No   Has the patient had a decrease in activity level because of a fear of falling?  No   Is the patient reluctant to leave their home because of a fear of falling?  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

## 2017-05-25 ENCOUNTER — Ambulatory Visit (INDEPENDENT_AMBULATORY_CARE_PROVIDER_SITE_OTHER): Payer: BLUE CROSS/BLUE SHIELD | Admitting: Physical Therapy

## 2017-05-25 DIAGNOSIS — G8929 Other chronic pain: Secondary | ICD-10-CM | POA: Diagnosis not present

## 2017-05-25 DIAGNOSIS — M791 Myalgia, unspecified site: Secondary | ICD-10-CM

## 2017-05-25 DIAGNOSIS — M545 Low back pain, unspecified: Secondary | ICD-10-CM

## 2017-05-25 DIAGNOSIS — R293 Abnormal posture: Secondary | ICD-10-CM | POA: Diagnosis not present

## 2017-05-25 NOTE — Therapy (Signed)
[facility] [address] [place], [place], [address] Phone: [phone]   Fax:  [phone]  Physical Therapy Treatment  Patient Details  Name: Jonathan Strong MRN: [id number] Date of Birth: 08-14-1985 Referring Provider: Dr. Gaspar Bidding  Encounter Date: 05/25/2017      PT End of Session - 05/25/17 1624    Visit Number 2   Number of Visits 6   Date for PT Re-Evaluation 06/29/17   Authorization Type BCBS-$20 copay; 28 visits remaining   Authorization - Number of Visits 28   PT Start Time 1550   PT Stop Time 1628   PT Time Calculation (min) 38 min   Activity Tolerance Patient tolerated treatment well   Behavior During Therapy [facility] for tasks assessed/performed      Past Medical History:  Diagnosis Date  . History of chicken pox   . History of mononucleosis    treated at [facility] 3 weeks ago per patient  . Hyperlipidemia     No past surgical history on file.  There were no vitals filed for this visit.      Subjective Assessment - 05/25/17 1552    Subjective exercises going well.  had some cramping into RLE with extension, but otherwise good.   Patient Stated Goals develop exercise routine to help decrease symptoms   Pain Score 0-No pain                         [facility] Adult PT Treatment/Exercise - 05/25/17 1554      Lumbar Exercises: Stretches   Passive Hamstring Stretch 2 reps;30 seconds   Piriformis Stretch 2 reps;30 seconds     Lumbar Exercises: Aerobic   Stationary Bike L3 x 6 min     Manual Therapy   Manual Therapy Soft tissue mobilization   Soft tissue mobilization lumbar spine and Rt glutes          Trigger Point Dry Needling - 05/25/17 1614    Consent Given? Yes   Education Handout Provided Yes   Muscles Treated Lower Body --  Lumbar multifidi Lt/Rt, glute med: twitch response elicited              PT Education - 05/25/17 1624    Education provided Yes   Education  Details use of ball for myofascial release   Person(s) Educated Patient   Methods Explanation;Demonstration   Comprehension Verbalized understanding;Returned demonstration             PT Long Term Goals - 05/18/17 1231      PT LONG TERM GOAL #1   Title independent with HEP   Status New   Target Date 06/29/17     PT LONG TERM GOAL #2   Title improve lumbar flexion to at least 60 degrees for improved flexibility and function   Status New   Target Date 06/29/17     PT LONG TERM GOAL #3   Title report ability to sit for at least 25 min without increase in pain for improved function   Status New   Target Date 06/29/17     PT LONG TERM GOAL #4   Title demonstrate 5/5 bil LE strength for improved function   Status New   Target Date 06/29/17               Plan - 05/25/17 1626    Clinical Impression Statement Pt tolerated DN well today to lumbar multifidi and glutes  with manual duing and after for soft tissue mobilization.  Instructed in use of ball as well for myofascial release.  Will wear shorts next session for DN of hamstrings if needed.   PT Treatment/Interventions ADLs/Self Care Home Management;Cryotherapy;Electrical Stimulation;Moist Heat;Ultrasound;Therapeutic exercise;Therapeutic activities;Functional mobility training;Patient/family education;Dry needling;Taping;Manual techniques   PT Next Visit Plan review HEP, continue flexibility and core/hip stability; DN to hamstrings   Consulted and Agree with Plan of Care Patient      Patient will benefit from skilled therapeutic intervention in order to improve the following deficits and impairments:  Decreased range of motion, Impaired flexibility, Postural dysfunction, Increased muscle spasms, Increased fascial restricitons, Pain, Decreased strength  Visit Diagnosis: Chronic midline low back pain without sciatica  Abnormal posture  Myalgia     Problem List Patient Active Problem List   Diagnosis Date Noted   . Degeneration of lumbar intervertebral disc 03/09/2017  . Chronic back pain, Low Back Pain 10/13/2014      Clarita Crane, PT, DPT 05/25/17 4:28 PM    [place] [facility] [address] [place], [place], [address] Phone: [phone]   Fax:  [phone]  Name: Lois Dentinger MRN: [id number] Date of Birth: [birth year]

## 2017-05-25 NOTE — Patient Instructions (Signed)
  TARGET BALL:  Turn towards the main section of the store and head towards the cards.  Turn at the cards and head towards the back of the store.  Just past the home goods before you get to the toys you will see an end cap with "party favor" type toys.  The ball is located in one of these bins.  Look for the softball sized ball that lights up.  It should be around $5.  

## 2017-06-01 ENCOUNTER — Ambulatory Visit (INDEPENDENT_AMBULATORY_CARE_PROVIDER_SITE_OTHER): Payer: BLUE CROSS/BLUE SHIELD | Admitting: Physical Therapy

## 2017-06-01 ENCOUNTER — Encounter: Payer: Self-pay | Admitting: Physical Therapy

## 2017-06-01 DIAGNOSIS — R293 Abnormal posture: Secondary | ICD-10-CM | POA: Diagnosis not present

## 2017-06-01 DIAGNOSIS — M791 Myalgia, unspecified site: Secondary | ICD-10-CM

## 2017-06-01 DIAGNOSIS — G8929 Other chronic pain: Secondary | ICD-10-CM

## 2017-06-01 DIAGNOSIS — M545 Low back pain, unspecified: Secondary | ICD-10-CM

## 2017-06-01 NOTE — Therapy (Signed)
[facility] [address] [place], [place], [address] Phone: [phone]   Fax:  [phone]  Physical Therapy Treatment  Patient Details  Name: Jonathan Strong MRN: [id number] Date of Birth: 08/20/1985 Referring Provider: Dr. Gaspar Bidding   Encounter Date: 06/01/2017  PT End of Session - 06/01/17 1555    Visit Number  3    Number of Visits  6    Date for PT Re-Evaluation  06/29/17    Authorization Type  BCBS-$20 copay; 28 visits remaining    Authorization - Number of Visits  28    PT Start Time  1515    PT Stop Time  1558    PT Time Calculation (min)  43 min    Activity Tolerance  Patient tolerated treatment well    Behavior During Therapy  [facility] for tasks assessed/performed       Past Medical History:  Diagnosis Date  . History of chicken pox   . History of mononucleosis    treated at [facility] 3 weeks ago per patient  . Hyperlipidemia     History reviewed. No pertinent surgical history.  There were no vitals filed for this visit.  Subjective Assessment - 06/01/17 1514    Subjective  back feels much better after needling.  still having some hamstring tightness    Patient Stated Goals  develop exercise routine to help decrease symptoms    Currently in Pain?  Yes    Pain Score  0-No pain up to 8/10 when he has pain   up to 8/10 when he has pain   Pain Location  Leg    Pain Orientation  Right;Posterior;Upper    Pain Descriptors / Indicators  Tightness;Cramping    Pain Type  Chronic pain    Pain Onset  More than a month ago    Pain Frequency  Intermittent                      [facility] Adult PT Treatment/Exercise - 06/01/17 1521      Lumbar Exercises: Stretches   Passive Hamstring Stretch  2 reps;30 seconds    Passive Hamstring Stretch Limitations  mid and high hamstring      Lumbar Exercises: Aerobic   Stationary Bike  L3 x 6 min      Manual Therapy   Manual Therapy  Soft tissue mobilization     Soft tissue mobilization  bil hamstrings with IASTM       Trigger Point Dry Needling - 06/01/17 1552    Consent Given?  Yes    Muscles Treated Lower Body  Hamstring    Hamstring Response  Twitch response elicited;Palpable increased muscle length bil   bil               PT Long Term Goals - 05/18/17 1231      PT LONG TERM GOAL #1   Title  independent with HEP    Status  New    Target Date  06/29/17      PT LONG TERM GOAL #2   Title  improve lumbar flexion to at least 60 degrees for improved flexibility and function    Status  New    Target Date  06/29/17      PT LONG TERM GOAL #3   Title  report ability to sit for at least 25 min without increase in pain for improved function    Status  New  Target Date  06/29/17      PT LONG TERM GOAL #4   Title  demonstrate 5/5 bil LE strength for improved function    Status  New    Target Date  06/29/17            Plan - 06/01/17 1555    Clinical Impression Statement  Pt responded well to DN of hamstrings and reports no back pain following DN from last session.  Will continue to benefit from PT to maximize function and decrease pain.    PT Treatment/Interventions  ADLs/Self Care Home Management;Cryotherapy;Electrical Stimulation;Moist Heat;Ultrasound;Therapeutic exercise;Therapeutic activities;Functional mobility training;Patient/family education;Dry needling;Taping;Manual techniques    PT Next Visit Plan  review HEP, continue flexibility and core/hip stability; DN/manual PRN    Consulted and Agree with Plan of Care  Patient       Patient will benefit from skilled therapeutic intervention in order to improve the following deficits and impairments:  Decreased range of motion, Impaired flexibility, Postural dysfunction, Increased muscle spasms, Increased fascial restricitons, Pain, Decreased strength  Visit Diagnosis: Chronic midline low back pain without sciatica  Abnormal posture  Myalgia     Problem  List Patient Active Problem List   Diagnosis Date Noted  . Degeneration of lumbar intervertebral disc 03/09/2017  . Chronic back pain, Low Back Pain 10/13/2014      Clarita Crane, PT, DPT 06/01/17 3:59 PM     [place] [address] [place], [place], [address] Phone: [phone]   Fax:  [phone]  Name: Jonathan Strong MRN: [id number] Date of Birth: Jan 05, 1986

## 2017-06-15 ENCOUNTER — Telehealth: Payer: Self-pay | Admitting: Family Medicine

## 2017-06-15 ENCOUNTER — Ambulatory Visit: Payer: BLUE CROSS/BLUE SHIELD | Admitting: Sports Medicine

## 2017-06-15 ENCOUNTER — Ambulatory Visit (INDEPENDENT_AMBULATORY_CARE_PROVIDER_SITE_OTHER): Payer: BLUE CROSS/BLUE SHIELD | Admitting: Physical Therapy

## 2017-06-15 ENCOUNTER — Encounter: Payer: Self-pay | Admitting: Sports Medicine

## 2017-06-15 VITALS — BP 128/82 | HR 71 | Ht 72.25 in | Wt 217.4 lb

## 2017-06-15 DIAGNOSIS — M5136 Other intervertebral disc degeneration, lumbar region: Secondary | ICD-10-CM

## 2017-06-15 DIAGNOSIS — M545 Low back pain, unspecified: Secondary | ICD-10-CM

## 2017-06-15 DIAGNOSIS — R293 Abnormal posture: Secondary | ICD-10-CM

## 2017-06-15 DIAGNOSIS — M791 Myalgia, unspecified site: Secondary | ICD-10-CM | POA: Diagnosis not present

## 2017-06-15 DIAGNOSIS — G8929 Other chronic pain: Secondary | ICD-10-CM | POA: Diagnosis not present

## 2017-06-15 DIAGNOSIS — F439 Reaction to severe stress, unspecified: Secondary | ICD-10-CM | POA: Insufficient documentation

## 2017-06-15 DIAGNOSIS — M5442 Lumbago with sciatica, left side: Secondary | ICD-10-CM

## 2017-06-15 DIAGNOSIS — M5441 Lumbago with sciatica, right side: Secondary | ICD-10-CM

## 2017-06-15 NOTE — Assessment & Plan Note (Signed)
Single episode of suicidal ideation with plan as outlined per HPI.  He reports no current thoughts or recurrent thoughts following the one episode.  He reports having moderate alcohol intake prior to this event is not taking the gabapentin immediately prior to this but has been taking it suspect this may be medication related and will have him discontinue this.  We contracted for safety as well.  Recommend close follow-up with his PCP and psychological services and information to schedule with Edyth GunnelsLisa Forster was provided today as he is interested in potential couples counseling as well.

## 2017-06-15 NOTE — Therapy (Signed)
[place] [address] [place], [place], [address] Phone: [phone]   Fax:  [phone]  Physical Therapy Treatment  Patient Details  Name: Jonathan Strong MRN: [id number] Date of Birth: 12/20/1985 Referring Provider: Dr. Teresa Coombs   Encounter Date: 06/15/2017  PT End of Session - 06/15/17 0846    Visit Number  4    Number of Visits  6    Date for PT Re-Evaluation  06/29/17    Authorization Type  BCBS-$20 copay; 28 visits remaining    Authorization - Number of Visits  28    PT Start Time  0846    PT Stop Time  0930    PT Time Calculation (min)  44 min    Activity Tolerance  Patient tolerated treatment well    Behavior During Therapy  [facility] for tasks assessed/performed       Past Medical History:  Diagnosis Date  . History of chicken pox   . History of mononucleosis    treated at [facility] 3 weeks ago per patient  . Hyperlipidemia     No past surgical history on file.  There were no vitals filed for this visit.  Subjective Assessment - 06/15/17 0846    Subjective  Patient reports he didn't get any relief after last DN session to HS, although HS were very sore afterwards.    Limitations  Sitting;Lifting    How long can you sit comfortably?  "couple minutes"    Patient Stated Goals  develop exercise routine to help decrease symptoms    Currently in Pain?  Yes    Pain Score  2     Pain Location  Back    Pain Orientation  Lower    Pain Descriptors / Indicators  Sore    Pain Type  Chronic pain    Pain Radiating Towards  RLE    Pain Onset  More than a month ago    Pain Frequency  Intermittent    Aggravating Factors   extension, sitting. lifting    Pain Relieving Factors  walking, moving                      [facility] Adult PT Treatment/Exercise - 06/15/17 0001      Lumbar Exercises: Stretches   Lower Trunk Rotation  1 rep;30 seconds SDLY    Piriformis Stretch  2 reps;60 seconds figure 4      Lumbar Exercises: Aerobic   Stationary Bike  L5 x 5 min      Lumbar Exercises: Supine   Other Supine Lumbar Exercises  pelvic rocking x 15      Lumbar Exercises: Sidelying   Other Sidelying Lumbar Exercises  SDLY plank 2x10 sec      Lumbar Exercises: Prone   Other Prone Lumbar Exercises  --      Lumbar Exercises: Quadruped   Plank  2 x 10 sec      Manual Therapy   Manual Therapy  Soft tissue mobilization;Myofascial release    Soft tissue mobilization  to B gluteals and piriformis     Myofascial Release  TPR to R piriformis and gluteals       Trigger Point Dry Needling - 06/15/17 1251    Consent Given?  Yes    Muscles Treated Lower Body  Gluteus minimus;Gluteus maximus;Piriformis Bil    Gluteus Maximus Response  Twitch response elicited;Palpable increased muscle length    Gluteus Minimus Response  Twitch response  elicited;Palpable increased muscle length    Piriformis Response  Twitch response elicited;Palpable increased muscle length           PT Education - 06/15/17 1254    Education provided  Yes    Education Details  HEP    Person(s) Educated  Patient    Methods  Explanation;Demonstration;Verbal cues;Tactile cues patient didn't want H/O    Comprehension  Returned demonstration;Verbalized understanding          PT Long Term Goals - 05/18/17 1231      PT LONG TERM GOAL #1   Title  independent with HEP    Status  New    Target Date  06/29/17      PT LONG TERM GOAL #2   Title  improve lumbar flexion to at least 60 degrees for improved flexibility and function    Status  New    Target Date  06/29/17      PT LONG TERM GOAL #3   Title  report ability to sit for at least 25 min without increase in pain for improved function    Status  New    Target Date  06/29/17      PT LONG TERM GOAL #4   Title  demonstrate 5/5 bil LE strength for improved function    Status  New    Target Date  06/29/17            Plan - 06/15/17 1300    Clinical Impression  Statement  Patient responded very well to DN in B gluteals and piriformis (R>L). He was able to perform plank and sideplanks but definitely has weaknesses here. No LTGs met at this time.     PT Frequency  1x / week    PT Duration  6 weeks    PT Treatment/Interventions  ADLs/Self Care Home Management;Cryotherapy;Electrical Stimulation;Moist Heat;Ultrasound;Therapeutic exercise;Therapeutic activities;Functional mobility training;Patient/family education;Dry needling;Taping;Manual techniques    PT Next Visit Plan  review HEP, continue flexibility and core/hip stability; DN/manual PRN    PT Home Exercise Plan  figure 4 piriformis, trunk rotation stretch, plank, side plank    Consulted and Agree with Plan of Care  Patient       Patient will benefit from skilled therapeutic intervention in order to improve the following deficits and impairments:  Decreased range of motion, Impaired flexibility, Postural dysfunction, Increased muscle spasms, Increased fascial restricitons, Pain, Decreased strength  Visit Diagnosis: Chronic midline low back pain without sciatica     Problem List Patient Active Problem List   Diagnosis Date Noted  . Stress 06/15/2017  . Degeneration of lumbar intervertebral disc 03/09/2017  . Chronic back pain, Low Back Pain 10/13/2014   Madelyn Flavors PT 06/15/2017, 1:05 PM  [place] [address] [place], [place], [address] Phone: [phone]   Fax:  [phone]  Name: Jonathan Strong MRN: [id number] Date of Birth: [birth year]

## 2017-06-15 NOTE — Telephone Encounter (Signed)
Patient was in today for an appointment for physical therapy. While appointment notes state he has more to schedule, patient is unsure if he actually needs more appointments. Patient requests that Judeth CornfieldStephanie calls him to verify and/or discuss if he needs said appointments.

## 2017-06-15 NOTE — Assessment & Plan Note (Signed)
Prior multilevel mild degenerative changes reported per prior MRI.  He does have a lumbarized S1 and is markedly tight anterior chain.  We will continue with physical therapy, therapeutic exercises and information regarding referral to chiropractic was provided today.

## 2017-06-15 NOTE — Patient Instructions (Addendum)
I recommend you call to set up an appointment with Dr. Darrick Pennaamien Rodolfo so that he can work on your shoulder and upper back.  Continue working on the exercises and following up with physical therapy.  If you have any recurrent bad thoughts please go over the safety plan we discussed as well as get in touch with us immediately.  Dr. Durene CalHunter and Dr. Jimmey RalphParker are both here and her excellent doctors.  You will need to discuss this with Dr. Selena BattenKim.  You will also need to call to schedule an appointment with Misty StanleyLisa for counseling.

## 2017-06-15 NOTE — Progress Notes (Signed)
OFFICE VISIT NOTE Jonathan Strong. Jonathan Strong [facility] [phone]  Travers Welle - 31 y.o. male MRN [id number]  Date of birth: 1986/07/17  Visit Date: 06/15/2017  PCP: Terressa Koyanagi, DO   Referred by: Terressa Koyanagi, DO  [name], CMA acting as scribe for Dr. Berline Chough.  SUBJECTIVE:   Chief Complaint  Patient presents with  . Follow-up    chronic bilateral low back pain w/o sciatica   HPI: As below and per problem based documentation when appropriate.  [name] is an established patient presenting today in follow-up of chronic bilateral low back pain without sciatica. He was last seen in the office 05/05/2017. He was prescribed Gabapentin and Medrol dosepak.   Pt has been going to PT and has noticed some relief in back pain. He noticed a great deal of relief after dry needling. He feels like for the most part his lower back pain has resolved. He doe shave occasional pain in the RT gluteal region that radiates into the RT leg. He has been taking Gabapentin 1-2 x daily. He has noticed "bad thoughts" since starting on this medication. He doesn't feel that he got much relief after taking the Medrol Dosepak.   Patient does report a single episode of suicidal ideation including a plan that occurred 3 nights ago.  He has never had any similar episodes to this and denies any current thoughts or recurrent suicidal ideation.  He reports overall he is feeling better than where he has ever been in the past but does have significant underlying stresses and the past events that he still struggles with it is interested in seeing counseling.    Review of Systems  Constitutional: Negative for chills, fever and malaise/fatigue.  Respiratory: Negative for shortness of breath and wheezing.   Cardiovascular: Negative for chest pain, palpitations and leg swelling.  Musculoskeletal: Positive for back pain (low back pain with RT sided sciatica).    Neurological: Negative for dizziness, tingling, weakness and headaches.    Otherwise per HPI.  HISTORY & PERTINENT PRIOR DATA:  No specialty comments available. He reports that he has quit smoking. he has never used smokeless tobacco.  Recent Labs    02/13/17 0915  HGBA1C 5.4   Patient Active Problem List   Diagnosis Date Noted  . Stress 06/15/2017  . Degeneration of lumbar intervertebral disc 03/09/2017  . Chronic back pain, Low Back Pain 10/13/2014   Past Medical History:  Diagnosis Date  . History of chicken pox   . History of mononucleosis    treated at [facility] 3 weeks ago per patient  . Hyperlipidemia    Family History  Problem Relation Age of Onset  . Mental illness Brother   . Bipolar disorder Brother   . Schizophrenia Father    History reviewed. No pertinent surgical history. Social History   Occupational History  . Not on file  Tobacco Use  . Smoking status: Former Games developer  . Smokeless tobacco: Never Used  Substance and Sexual Activity  . Alcohol use: No    Comment: ocassional  . Drug use: No  . Sexual activity: Not on file   No Known Allergies Outpatient Medications Prior to Visit  Medication Sig Dispense Refill  . gabapentin (NEURONTIN) 300 MG capsule Start with 1 tab po qhs X 1 week, then increase to 1 tab po bid X 1 week then 1 tab po tid prn (Patient taking differently:  take 1 tab po tid prn) 90 capsule 1   No facility-administered medications prior to visit.      OBJECTIVE:  VS:  HT:6' 0.25" (183.5 cm)   WT:217 lb 6.4 oz (98.6 kg)  BMI:29.29    BP:128/82  HR:71bpm  TEMP: ( )  RESP:96 % EXAM: Findings:  Improved lumbar motion and overall improved posture with more upright position.   He has some periscapular trigger points that are mild and thoracic rotation.  I am unable to manipulate this today due to physical limitations I have at this time.  Would likely benefit well from manual medicine.    RADIOLOGY: DG Lumbar Spine  Complete CLINICAL DATA:  Low back pain  EXAM: LUMBAR SPINE - COMPLETE 4+ VIEW  COMPARISON:  None.  FINDINGS: Six non rib-bearing lumbar type vertebral bodies. Disc spaces are maintained. No fracture. SI joints are symmetric and unremarkable.  IMPRESSION: No acute bony abnormality.  Electronically Signed   By: Charlett Nose M.D.   On: 04/20/2017 11:23  ASSESSMENT & PLAN:     ICD-10-CM   1. Chronic midline low back pain without sciatica M54.5    G89.29   2. Myalgia M79.10   3. Abnormal posture R29.3   4. Stress F43.9   5. Chronic bilateral low back pain with bilateral sciatica M54.42    M54.41    G89.29   6. Degeneration of lumbar intervertebral disc M51.36    ================================================================= Stress Single episode of suicidal ideation with plan as outlined per HPI.  He reports no current thoughts or recurrent thoughts following the one episode.  He reports having moderate alcohol intake prior to this event is not taking the gabapentin immediately prior to this but has been taking it suspect this may be medication related and will have him discontinue this.  We contracted for safety as well.  Recommend close follow-up with his PCP and psychological services and information to schedule with Edyth Gunnels was provided today as he is interested in potential couples counseling as well.  Degeneration of lumbar intervertebral disc Prior multilevel mild degenerative changes reported per prior MRI.  He does have a lumbarized S1 and is markedly tight anterior chain.  We will continue with physical therapy, therapeutic exercises and information regarding referral to chiropractic was provided today.  No notes on file ================================================================= Patient Instructions  I recommend you call to set up an appointment with Dr. Darrick Penna so that he can work on your shoulder and upper back.  Continue working on the  exercises and following up with physical therapy.  If you have any recurrent bad thoughts please go over the safety plan we discussed as well as get in touch with [place] immediately.  Dr. Durene Cal and Dr. [name] are both here and her excellent doctors.  You will need to discuss this with Dr. Selena Batten.  You will also need to call to schedule an appointment with Misty Stanley for counseling.  ================================================================= Future Appointments  Date Time Provider Department Center  08/10/2017  8:20 AM Andrena Mews, DO [facility] None    Follow-up: Return in about 8 weeks (around 08/10/2017).   CMA/ATC served as Neurosurgeon during this visit. History, Physical, and Plan performed by medical provider. Documentation and orders reviewed and attested to.      Gaspar Bidding, DO    [facility]

## 2017-06-29 ENCOUNTER — Telehealth: Payer: Self-pay | Admitting: Family Medicine

## 2017-06-29 NOTE — Telephone Encounter (Signed)
Please respond with an approval or denial of the transfer for the patient   Copied from CRM [id number]. Topic: General - Other >> Jun 29, 2017  9:43 AM Jonathan Strong, [name] wrote: Reason for CRM: Pt would like to switch to dr Jimmey Ralphparker at [facility] . Pt no longer want male provider dr Selena Battenkim

## 2017-06-29 NOTE — Telephone Encounter (Signed)
Ok with me 

## 2017-06-29 NOTE — Telephone Encounter (Signed)
Okay with me 

## 2017-07-03 NOTE — Telephone Encounter (Signed)
I called and left a voicemail notifying patient that his transfer was approved. Patient can call back to schedule a transfer appointment (visit type will be new and provider will be Dr. Jimmey RalphParker).

## 2017-07-03 NOTE — Telephone Encounter (Signed)
Patient scheduled with Dr. Jacquiline Doealeb Parker for 07/20/2017 at 10am.

## 2017-07-20 ENCOUNTER — Other Ambulatory Visit: Payer: Self-pay

## 2017-07-20 ENCOUNTER — Ambulatory Visit (INDEPENDENT_AMBULATORY_CARE_PROVIDER_SITE_OTHER): Payer: BLUE CROSS/BLUE SHIELD | Admitting: Family Medicine

## 2017-07-20 ENCOUNTER — Encounter: Payer: Self-pay | Admitting: Family Medicine

## 2017-07-20 ENCOUNTER — Telehealth: Payer: Self-pay | Admitting: Family Medicine

## 2017-07-20 VITALS — BP 138/78 | HR 75 | Temp 97.9°F | Ht 72.25 in | Wt 215.6 lb

## 2017-07-20 DIAGNOSIS — M5441 Lumbago with sciatica, right side: Secondary | ICD-10-CM | POA: Diagnosis not present

## 2017-07-20 DIAGNOSIS — M5442 Lumbago with sciatica, left side: Principal | ICD-10-CM

## 2017-07-20 DIAGNOSIS — G8929 Other chronic pain: Secondary | ICD-10-CM | POA: Diagnosis not present

## 2017-07-20 DIAGNOSIS — R030 Elevated blood-pressure reading, without diagnosis of hypertension: Secondary | ICD-10-CM | POA: Diagnosis not present

## 2017-07-20 NOTE — Progress Notes (Signed)
   Subjective:  Jonathan Strong is a 31 y.o. male who presents today with a chief complaint of establish care and chronic midline low back pain.  HPI:  Chronic low back pain, established problem, improving Has been seen by sports medicine and physical therapy for the last several weeks.  Notes that his symptoms are improving with physical therapy and conservative measures.  No weakness or numbness.  ROS: Per HPI  Objective:  Physical Exam: BP 138/78 (BP Location: Left Arm, Patient Position: Sitting, Cuff Size: Normal)   Pulse 75   Temp 97.9 F (36.6 C) (Oral)   Ht 6' 0.25" (1.835 m)   Wt 215 lb 9.6 oz (97.8 kg)   SpO2 96%   BMI 29.04 kg/m   Gen: NAD, resting comfortably CV: RRR with no murmurs appreciated Pulm: NWOB, CTAB with no crackles, wheezes, or rhonchi MSK: No edema, cyanosis, or clubbing noted  Assessment/Plan:  Low back pain Improving with conservative measures.  Encouraged patient to continue with his current treatment plan.  Follow-up as needed.  Elevated blood pressure reading Elevated to 138/78 today.  Patient attributes this to recent spicy and salty food intake.  Discussed lifestyle modifications including healthy diet, low-salt diet, and regular exercise.  Follow-up in 6 months at his CPE.  Katina Degreealeb M. Jimmey RalphParker, MD 07/20/2017 10:10 AM

## 2017-07-20 NOTE — Telephone Encounter (Signed)
Referral has been placed. 

## 2017-07-20 NOTE — Telephone Encounter (Signed)
The patient needs an appointment for dry needling. There is no referral for this; the referral is needed. The patient states that Mondays work best for him. He was advised that dry needling is only done on Fridays now but requests that we ask for Mondays still.

## 2017-08-10 ENCOUNTER — Encounter: Payer: Self-pay | Admitting: Sports Medicine

## 2017-08-10 ENCOUNTER — Ambulatory Visit: Payer: BLUE CROSS/BLUE SHIELD | Admitting: Sports Medicine

## 2017-08-10 ENCOUNTER — Other Ambulatory Visit: Payer: Self-pay

## 2017-08-10 ENCOUNTER — Ambulatory Visit (INDEPENDENT_AMBULATORY_CARE_PROVIDER_SITE_OTHER): Payer: BLUE CROSS/BLUE SHIELD | Admitting: Psychology

## 2017-08-10 ENCOUNTER — Encounter: Payer: Self-pay | Admitting: Physical Therapy

## 2017-08-10 ENCOUNTER — Ambulatory Visit: Payer: BLUE CROSS/BLUE SHIELD | Attending: Family Medicine | Admitting: Physical Therapy

## 2017-08-10 VITALS — BP 136/86 | HR 86 | Ht 72.25 in | Wt 216.6 lb

## 2017-08-10 DIAGNOSIS — M5442 Lumbago with sciatica, left side: Secondary | ICD-10-CM

## 2017-08-10 DIAGNOSIS — F4322 Adjustment disorder with anxiety: Secondary | ICD-10-CM | POA: Diagnosis not present

## 2017-08-10 DIAGNOSIS — G8929 Other chronic pain: Secondary | ICD-10-CM

## 2017-08-10 DIAGNOSIS — M5136 Other intervertebral disc degeneration, lumbar region: Secondary | ICD-10-CM

## 2017-08-10 DIAGNOSIS — M546 Pain in thoracic spine: Secondary | ICD-10-CM | POA: Diagnosis present

## 2017-08-10 DIAGNOSIS — M5441 Lumbago with sciatica, right side: Secondary | ICD-10-CM

## 2017-08-10 DIAGNOSIS — M545 Low back pain: Secondary | ICD-10-CM | POA: Diagnosis present

## 2017-08-10 NOTE — Patient Instructions (Signed)

## 2017-08-10 NOTE — Progress Notes (Signed)
Jonathan Strong. Jonathan Strong [facility] [phone]  Jonathan Strong - 32 y.o. male MRN [id number]  Date of birth: 26-Jul-1986  Visit Date: 08/10/2017  PCP: Jonathan Dark, MD   Referred by: Jonathan Koyanagi, DO   Scribe for today's visit: Jonathan Strong, CMA     SUBJECTIVE:  Jonathan Strong is here for Follow-up (low back pain)  Compared to the last office visit, his previously described symptoms are improving. He was at a conference recently and had to sit for 4 days which has caused some pain in lower back.  Current symptoms are moderate & are radiating to the gluteal region.  He has not been seen by Dr. Lendon Collar. He did benefit from dry needling in the past, he will be having this done later today.    ROS Denies night time disturbances. Denies fevers, chills, or night sweats. Denies unexplained weight loss. Denies personal history of cancer. Denies changes in bowel or bladder habits. Denies recent unreported falls. Denies new or worsening dyspnea or wheezing. Denies headaches or dizziness.  Denies numbness, tingling or weakness  In the extremities.  Denies dizziness or presyncopal episodes Denies lower extremity edema     HISTORY & PERTINENT PRIOR DATA:  Prior History reviewed and updated per electronic medical record.  Significant history, findings, studies and interim changes include:  reports that he has quit smoking. he has never used smokeless tobacco. Recent Labs    02/13/17 0915  HGBA1C 5.4   No specialty comments available. No problems updated.  OBJECTIVE:  VS:  HT:6' 0.25" (183.5 cm)   WT:216 lb 9.6 oz (98.2 kg)  BMI:29.18    BP:136/86  HR:86bpm  TEMP: ( )  RESP:97 %   PHYSICAL EXAM: Constitutional: WDWN, Non-toxic appearing. Psychiatric: Alert & appropriately interactive. Not depressed or anxious appearing. Respiratory: No increased work of breathing. Trachea Midline Eyes: Pupils are equal.  EOM intact without nystagmus. No scleral icterus Cardiovascular:  Peripheral Pulses: peripheral pulses symmetrical No clubbing or cyanosis appreciated Capillary Refill is normal, less than 2 seconds No signficant generalized edema/anasarca Sensory Exam: intact to light touch   Slight pain with palpation of the left greater than right paraspinal muscles with overall good improvement however in hamstring flexibility as well as lumbar flexion and extension.  He has negative straight leg raise bilaterally he is able to heel toe walk without difficulty.  No significant midline tenderness. No additional findings.   ASSESSMENT & PLAN:   1. Chronic bilateral low back pain with bilateral sciatica   2. Degeneration of lumbar intervertebral disc    PLAN:>50% of this  minute visit spent in direct patient counseling and/or coordination of care.  Discussion was focused on education regarding the in discussing the pathoetiology and anticipated clinical course of the above condition.  Ultimately he will benefit from continued with home home therapeutic exercises as well as continue with dry needling.  He is setting up through [facility] outpatient rehab on [facility] today to continue with dry needling.  We will plan to check in with him on an as-needed basis once again emphasized the importance of core recruitment exercises and interruption of prolonged sitting including when he goes on trips and is at conferences cyst associated to be diminishing in the past times for him.  ++++++++++++++++++++++++++++++++++++++++++++ Orders & Meds: No orders of the defined types were placed in this encounter.   No orders of the defined types were placed  in this encounter.   ++++++++++++++++++++++++++++++++++++++++++++ Follow-up: Return if symptoms worsen or fail to improve.   Pertinent documentation may be included in additional procedure notes, imaging studies, problem based documentation and patient instructions.  Please see these sections of the encounter for additional information regarding this visit. CMA/ATC served as Neurosurgeon during this visit. History, Physical, and Plan performed by medical provider. Documentation and orders reviewed and attested to.      Andrena Mews, DO    [facility] Physician

## 2017-08-10 NOTE — Therapy (Signed)
[facility] [address] [place], [place], [address] Phone: [phone]   Fax:  [phone]  Physical Therapy Treatment/Re-evaluation  Patient Details  Name: Jonathan BoltBenjamin T Van Strong MRN: [id number] Date of Birth: Sep 26, 1985 Referring Provider: Jacquiline Doealeb Parker, MD   Encounter Date: 08/10/2017  PT End of Session - 08/10/17 1538    Visit Number  5    Number of Visits  9    Date for PT Re-Evaluation  09/07/17    Authorization Type  BCBS    PT Start Time  1503    PT Stop Time  1536    PT Time Calculation (min)  33 min    Activity Tolerance  Patient tolerated treatment well    Behavior During Therapy  [facility] for tasks assessed/performed       Past Medical History:  Diagnosis Date  . History of chicken pox   . History of mononucleosis    [facility] per patient  . Hyperlipidemia     History reviewed. No pertinent surgical history.  There were no vitals filed for this visit.  Subjective Assessment - 08/10/17 1505    Subjective  Still having minor shooting pain in low back. Sat for about 4 days at a conference which increased pain. Feels bilat SIJ pain into gluts. Has had disk issues in the past. DN about 2 months ago with success. Sitting still is aggrivating factor. Would like to get back into cycling. Feels discomfort into thoracic region and bilat UE.     Patient Stated Goals  return to cycling    Currently in Pain?  Yes    Pain Score  4     Pain Location  Back    Pain Orientation  Right;Left    Pain Descriptors / Indicators  Sore    Aggravating Factors   sitting still    Pain Relieving Factors  moving around         [facility] PT Assessment - 08/10/17 0001      Assessment   Medical Diagnosis  LBP    Referring Provider  Jacquiline Doealeb Parker, MD      Precautions   Spinal Brace  Other (comment) compression brace while driving      Restrictions   Weight Bearing Restrictions  No      Balance Screen   Has the  patient fallen in the past 6 months  No      Prior Function   Vocation Requirements  salesman      Cognition   Overall Cognitive Status  Within Functional Limits for tasks assessed      Sensation   Additional Comments  [facility]      Strength   Right Hip ABduction  4+/5    Left Hip ABduction  4+/5      Palpation   Palpation comment  concordant discomfort in glut max & paraspinals surrounding bilat SIJ      Special Tests   Lumbar Tests  Straight Leg Raise negative                  [facility] Adult PT Treatment/Exercise - 08/10/17 0001      Manual Therapy   Manual Therapy  Joint mobilization    Manual therapy comments  skilled palpation and monitoring during TPDN    Joint Mobilization  bilat SIJ mobs    Soft tissue mobilization  bilat lumbar paraspinals       Trigger Point Dry Needling - 08/10/17  in order to improve the following deficits and impairments:  Impaired flexibility, Postural dysfunction, Increased muscle spasms, Increased fascial restricitons, Pain, Decreased strength  Visit Diagnosis: Chronic bilateral  low back pain, with sciatica presence unspecified - Plan: PT plan of care cert/re-cert  Pain in thoracic spine - Plan: PT plan of care cert/re-cert     Problem List Patient Active Problem List   Diagnosis Date Noted  . Stress 06/15/2017  . Degeneration of lumbar intervertebral disc 03/09/2017  . Chronic back pain, Low Back Pain 10/13/2014    Shamel Galyean C. Draven Natter PT, DPT 08/10/17 5:25 PM   [facility] [address] [place], [place], [address] Phone: [phone]   Fax:  [phone]  Name: Jonathan Strong MRN: [id number] Date of Birth: May 04, 1986  in order to improve the following deficits and impairments:  Impaired flexibility, Postural dysfunction, Increased muscle spasms, Increased fascial restricitons, Pain, Decreased strength  Visit Diagnosis: Chronic bilateral  low back pain, with sciatica presence unspecified - Plan: PT plan of care cert/re-cert  Pain in thoracic spine - Plan: PT plan of care cert/re-cert     Problem List Patient Active Problem List   Diagnosis Date Noted  . Stress 06/15/2017  . Degeneration of lumbar intervertebral disc 03/09/2017  . Chronic back pain, Low Back Pain 10/13/2014    Shamel Galyean C. Draven Natter PT, DPT 08/10/17 5:25 PM   [facility] [address] [place], [place], [address] Phone: [phone]   Fax:  [phone]  Name: Jonathan Strong MRN: [id number] Date of Birth: May 04, 1986

## 2017-08-24 ENCOUNTER — Ambulatory Visit: Payer: BLUE CROSS/BLUE SHIELD | Admitting: Psychology

## 2017-08-24 ENCOUNTER — Encounter: Payer: Self-pay | Admitting: Physical Therapy

## 2017-08-24 ENCOUNTER — Ambulatory Visit: Payer: Self-pay | Admitting: Psychology

## 2017-08-24 ENCOUNTER — Ambulatory Visit: Payer: BLUE CROSS/BLUE SHIELD | Admitting: Physical Therapy

## 2017-08-24 DIAGNOSIS — M545 Low back pain: Principal | ICD-10-CM

## 2017-08-24 DIAGNOSIS — M546 Pain in thoracic spine: Secondary | ICD-10-CM

## 2017-08-24 DIAGNOSIS — G8929 Other chronic pain: Secondary | ICD-10-CM

## 2017-08-24 NOTE — Therapy (Signed)
[facility] [address] [place], [place], [address] Phone: [phone]   Fax:  [phone]  Physical Therapy Treatment  Patient Details  Name: Jonathan Strong MRN: [id number] Date of Birth: 09-Jun-1986 Referring Provider: Jacquiline Doe, MD   Encounter Date: 08/24/2017  PT End of Session - 08/24/17 0815    Visit Number  6    Number of Visits  9    Date for PT Re-Evaluation  09/07/17    Authorization Type  BCBS    Authorization - Number of Visits  28    PT Start Time  0800    PT Stop Time  0840    PT Time Calculation (min)  40 min    Activity Tolerance  Patient tolerated treatment well    Behavior During Therapy  [facility] for tasks assessed/performed       Past Medical History:  Diagnosis Date  . History of chicken pox   . History of mononucleosis    treated at [facility] ER 3 weeks ago per patient  . Hyperlipidemia     History reviewed. No pertinent surgical history.  There were no vitals filed for this visit.  Subjective Assessment - 08/24/17 0804    Subjective  Patient reports the pain in his lower back is much better. he is having some pain in his mid back and shoulders. He feels like someone is flicking him in the shoulders     Limitations  Sitting;Lifting    How long can you sit comfortably?  "couple minutes"    Patient Stated Goals  return to cycling    Currently in Pain?  Yes    Pain Score  1     Pain Location  Back    Pain Orientation  Right;Left    Pain Descriptors / Indicators  Sore    Pain Type  Chronic pain    Pain Onset  More than a month ago    Pain Frequency  Intermittent    Aggravating Factors   sitting still     Pain Relieving Factors  moving and lifting                       [facility] Adult PT Treatment/Exercise - 08/24/17 0001      Shoulder Exercises: Supine   Other Supine Exercises  D2 felxion with band x15 each; bilateral ER x15 each; Bilateral horizontal abdution x 15 red; flexion  with abduction x15 red.       Shoulder Exercises: Seated   Other Seated Exercises  row machine 2x10 each handle 25lbs;     Other Seated Exercises  shopulde rpress 10lb 2x10       Shoulder Exercises: Standing   Other Standing Exercises  Standing I's and Ys 4lb in mirro to watch shoulders.     Other Standing Exercises  standing lat pull down at machine 25lb  x20       Manual Therapy   Manual therapy comments  skilled palpation and monitoring during TPDN      Neck Exercises: Stretches   Upper Trapezius Stretch Limitations  2x20 sec hold bilateral    Levator Stretch Limitations  2x20 sec hold bilateral     Other Neck Stretches  posterior capsule stretch with rotation for rohomboid and scpaular stretching 2x20 sec hold bilateral              PT Education - 08/24/17 0814    Education provided  Yes  Education Details  Reviewed symptom mangement with a return to the gym program     Person(s) Educated  Patient    Methods  Explanation;Demonstration;Tactile cues;Verbal cues    Comprehension  Verbalized understanding;Returned demonstration;Verbal cues required;Tactile cues required          PT Long Term Goals - 08/10/17 1600      PT LONG TERM GOAL #1   Title  independent with HEP    Baseline  will progress as appropriate    Time  4    Period  Weeks    Status  Revised    Target Date  09/07/17      PT LONG TERM GOAL #2   Title  Pt will be able to ride bike without increase in pain    Baseline  has been on hold for about 1 year but is ready to get back into it in the next couple of weeks    Time  4    Period  Weeks    Status  Revised    Target Date  09/07/17      PT LONG TERM GOAL #3   Title  Pt will be able to sit comfortably for at least 30 min for improved function    Baseline  moves frequently at visit today    Time  4    Period  Weeks    Status  New    Target Date  09/07/17      PT LONG TERM GOAL #4   Title  demonstrate 5/5 bil LE strength for improved  function as well proper core contraction for stability in various postures    Baseline  bilat hip abd 4+/5, requires further education for core    Time  4    Period  Weeks    Status  Revised    Target Date  09/07/17            Plan - 08/24/17 0815    Clinical Impression Statement  Patient tolerated gym activity well. He reported minor spasming and cramping in his neck but it did not last. Therapy reviewed types of shoulder exercises he should avoid at the gym. He reported his biggest prolem is a cramping feeling in his upper back now. Therapy palpated and could not find any trigger points. Therapy palpated after exercises and he still had no trigger points. Therapy will re-assess next visit. No needling perfromed today.     Clinical Presentation  Stable    Clinical Decision Making  Low    Rehab Potential  Good    PT Frequency  1x / week    PT Duration  4 weeks    PT Treatment/Interventions  ADLs/Self Care Home Management;Cryotherapy;Electrical Stimulation;Moist Heat;Ultrasound;Therapeutic exercise;Therapeutic activities;Functional mobility training;Patient/family education;Dry needling;Taping;Manual techniques;Passive range of motion    PT Next Visit Plan  prone periscapular exercises, qped abdominal challenges    PT Home Exercise Plan  figure 4 piriformis, trunk rotation stretch, plank, side plank    Consulted and Agree with Plan of Care  Patient       Patient will benefit from skilled therapeutic intervention in order to improve the following deficits and impairments:  Impaired flexibility, Postural dysfunction, Increased muscle spasms, Increased fascial restricitons, Pain, Decreased strength  Visit Diagnosis: Chronic bilateral low back pain, with sciatica presence unspecified  Pain in thoracic spine     Problem List Patient Active Problem List   Diagnosis Date Noted  . Stress 06/15/2017  . Degeneration of lumbar intervertebral  disc 03/09/2017  . Chronic back pain, Low  Back Pain 10/13/2014    Dessie Coma PT DPT  08/24/2017, 11:43 AM  [facility] [address] [place], [place], [address] Phone: [phone]   Fax:  [phone]  Name: Jonathan Strong MRN: [id number] Date of Birth: 08-Nov-1985

## 2017-08-31 ENCOUNTER — Encounter: Payer: Self-pay | Admitting: Physical Therapy

## 2017-08-31 ENCOUNTER — Ambulatory Visit: Payer: BLUE CROSS/BLUE SHIELD | Attending: Family Medicine | Admitting: Physical Therapy

## 2017-08-31 DIAGNOSIS — G8929 Other chronic pain: Secondary | ICD-10-CM | POA: Diagnosis present

## 2017-08-31 DIAGNOSIS — M545 Low back pain: Secondary | ICD-10-CM | POA: Diagnosis not present

## 2017-08-31 DIAGNOSIS — M546 Pain in thoracic spine: Secondary | ICD-10-CM | POA: Diagnosis present

## 2017-09-01 ENCOUNTER — Encounter: Payer: Self-pay | Admitting: Physical Therapy

## 2017-09-01 NOTE — Therapy (Signed)
[facility] [address] [place], [place], [address] Phone: [phone]   Fax:  [phone]  Physical Therapy Treatment  Patient Details  Name: Jonathan Strong MRN: [id number] Date of Birth: 27-Mar-1986 Referring Provider: Jacquiline Doe, MD   Encounter Date: 08/31/2017  PT End of Session - 09/01/17 0813    Visit Number  7    Number of Visits  9    Date for PT Re-Evaluation  09/07/17    Authorization Type  BCBS    PT Start Time  0930    PT Stop Time  1011    PT Time Calculation (min)  41 min    Activity Tolerance  Patient tolerated treatment well    Behavior During Therapy  [facility] for tasks assessed/performed       Past Medical History:  Diagnosis Date  . History of chicken pox   . History of mononucleosis    treated at [facility] ER 3 weeks ago per patient  . Hyperlipidemia     History reviewed. No pertinent surgical history.  There were no vitals filed for this visit.  Subjective Assessment - 09/01/17 0805    Subjective  Patient reports since exercising last week he hashad a" catch" in his lower cervical spine with cervical extension. He otherwise has not had much pain.     Limitations  Sitting;Lifting    How long can you sit comfortably?  "couple minutes"    Patient Stated Goals  return to cycling    Currently in Pain?  Yes    Pain Score  6  with cervical extension     Pain Location  Neck    Pain Orientation  Right;Left    Pain Descriptors / Indicators  Sore    Pain Type  Chronic pain    Pain Onset  More than a month ago    Pain Frequency  Intermittent    Aggravating Factors   sitting still     Pain Relieving Factors  moving and lifting     Effect of Pain on Daily Activities  unable to perfrom                       [facility] Adult PT Treatment/Exercise - 09/01/17 0001      Manual Therapy   Manual Therapy  Joint mobilization;Manual Traction    Manual therapy comments  skilled palpation and monitoring  during TPDN    Joint Mobilization  PA MOBs toT-2 through t-4    Soft tissue mobilization  IASTYM to bilateral cercial and thoracic paraspinals     Manual Traction  manual cervical traction       Neck Exercises: Stretches   Upper Trapezius Stretch Limitations  2x20 sec hold bilateral    Levator Stretch Limitations  2x20 sec hold bilateral     Other Neck Stretches  mulligan towel stretch with extension x10 mod cuing; cervical rotation x10 towel        Trigger Point Dry Needling - 09/01/17 8756    Consent Given?  Yes    Muscles Treated Upper Body  Longissimus    Longissimus Response  Twitch response elicited           PT Education - 09/01/17 0808    Education provided  Yes    Education Details  reviewed stretches and self mobilizations     Person(s) Educated  Patient    Methods  Explanation;Demonstration;Tactile cues;Verbal cues    Comprehension  Verbalized understanding;Returned  demonstration;Verbal cues required;Tactile cues required          PT Long Term Goals - 08/10/17 1600      PT LONG TERM GOAL #1   Title  independent with HEP    Baseline  will progress as appropriate    Time  4    Period  Weeks    Status  Revised    Target Date  09/07/17      PT LONG TERM GOAL #2   Title  Pt will be able to ride bike without increase in pain    Baseline  has been on hold for about 1 year but is ready to get back into it in the next couple of weeks    Time  4    Period  Weeks    Status  Revised    Target Date  09/07/17      PT LONG TERM GOAL #3   Title  Pt will be able to sit comfortably for at least 30 min for improved function    Baseline  moves frequently at visit today    Time  4    Period  Weeks    Status  New    Target Date  09/07/17      PT LONG TERM GOAL #4   Title  demonstrate 5/5 bil LE strength for improved function as well proper core contraction for stability in various postures    Baseline  bilat hip abd 4+/5, requires further education for core     Time  4    Period  Weeks    Status  Revised    Target Date  09/07/17            Plan - 09/01/17 1610    Clinical Impression Statement  Patient had good twitch response with upper thoracic an lower cervical paraspinls on the right. He reported improved ability to extend. Therapy reviewed stretches and exercises for his lower cervical spine.     Clinical Presentation  Stable    Clinical Decision Making  Low    Rehab Potential  Good    PT Frequency  1x / week    PT Duration  4 weeks    PT Treatment/Interventions  ADLs/Self Care Home Management;Cryotherapy;Electrical Stimulation;Moist Heat;Ultrasound;Therapeutic exercise;Therapeutic activities;Functional mobility training;Patient/family education;Dry needling;Taping;Manual techniques;Passive range of motion    PT Next Visit Plan  prone periscapular exercises, qped abdominal challenges; continue with TPDN and review stretches. Thoracic extension if able.     PT Home Exercise Plan  figure 4 piriformis, trunk rotation stretch, plank, side plank    Consulted and Agree with Plan of Care  Patient       Patient will benefit from skilled therapeutic intervention in order to improve the following deficits and impairments:  Impaired flexibility, Postural dysfunction, Increased muscle spasms, Increased fascial restricitons, Pain, Decreased strength  Visit Diagnosis: Chronic bilateral low back pain, with sciatica presence unspecified  Pain in thoracic spine     Problem List Patient Active Problem List   Diagnosis Date Noted  . Stress 06/15/2017  . Degeneration of lumbar intervertebral disc 03/09/2017  . Chronic back pain, Low Back Pain 10/13/2014    Dessie Coma PT DPT  09/01/2017, 8:25 AM  [facility] [address] [place], [place], [address] Phone: [phone]   Fax:  [phone]  Name: Jonathan Strong MRN: [id number] Date of Birth: [birth year]

## 2017-09-07 ENCOUNTER — Ambulatory Visit (INDEPENDENT_AMBULATORY_CARE_PROVIDER_SITE_OTHER): Payer: BLUE CROSS/BLUE SHIELD | Admitting: Psychology

## 2017-09-07 ENCOUNTER — Ambulatory Visit: Payer: BLUE CROSS/BLUE SHIELD | Admitting: Physical Therapy

## 2017-09-07 DIAGNOSIS — F4322 Adjustment disorder with anxiety: Secondary | ICD-10-CM

## 2017-09-07 DIAGNOSIS — M545 Low back pain: Secondary | ICD-10-CM | POA: Diagnosis not present

## 2017-09-07 DIAGNOSIS — G8929 Other chronic pain: Secondary | ICD-10-CM

## 2017-09-07 DIAGNOSIS — M546 Pain in thoracic spine: Secondary | ICD-10-CM

## 2017-09-08 ENCOUNTER — Encounter: Payer: Self-pay | Admitting: Physical Therapy

## 2017-09-08 NOTE — Therapy (Signed)
[facility] [address] [place], [place], [address] Phone: [phone]   Fax:  [phone]  Physical Therapy Treatment  Patient Details  Name: Bronte Kropf MRN: [id number] Date of Birth: 06/30/1986 Referring Provider: Jacquiline Doe, MD   Encounter Date: 09/07/2017  PT End of Session - 09/08/17 0820    Visit Number  8    Number of Visits  9    Date for PT Re-Evaluation  09/07/17    Authorization Type  BCBS    PT Start Time  0845    PT Stop Time  0924    PT Time Calculation (min)  39 min    Activity Tolerance  Patient tolerated treatment well    Behavior During Therapy  [facility] for tasks assessed/performed       Past Medical History:  Diagnosis Date  . History of chicken pox   . History of mononucleosis    treated at [facility] ER 3 weeks ago per patient  . Hyperlipidemia     History reviewed. No pertinent surgical history.  There were no vitals filed for this visit.  Subjective Assessment - 09/08/17 0816    Subjective  Patient reports no pain. He has been doing his exeecises. His hamstrings are sore.     Limitations  Sitting;Lifting    How long can you sit comfortably?  "couple minutes"    Currently in Pain?  No/denies                      [facility] Adult PT Treatment/Exercise - 09/08/17 0001      Lumbar Exercises: Stretches   Passive Hamstring Stretch  2 reps;30 seconds    Piriformis Stretch  2 reps;60 seconds      Lumbar Exercises: Supine   Clam  20 reps;Limitations    Clam Limitations  green band     Bridge  20 reps    Straight Leg Raise  -- 2x10      Shoulder Exercises: Supine   Other Supine Exercises  D2 felxion with band x15 each; bilateral ER x15 each; Bilateral horizontal abdution x 15 red; flexion with abduction x15 green.       Shoulder Exercises: Standing   Other Standing Exercises  standing chop grene 2x10; pallof press 2x10;       Neck Exercises: Stretches   Upper Trapezius Stretch  Limitations  2x20 sec hold bilateral    Levator Stretch Limitations  2x20 sec hold bilateral              PT Education - 09/08/17 0819    Education provided  Yes    Education Details  reviewed final HEP and activity progression     Person(s) Educated  Patient    Methods  Explanation;Demonstration;Tactile cues    Comprehension  Verbalized understanding;Returned demonstration;Verbal cues required          PT Long Term Goals - 09/08/17 6962      PT LONG TERM GOAL #1   Title  independent with HEP    Baseline  Independent     Time  4    Period  Weeks    Status  Achieved      PT LONG TERM GOAL #2   Title  Pt will be able to ride bike without increase in pain    Baseline  not back to full riding but can     Time  4    Period  Weeks  Status  Achieved      PT LONG TERM GOAL #3   Title  Pt will be able to sit comfortably for at least 30 min for improved function    Baseline  moves frequently at visit today    Time  4    Period  Weeks    Status  Achieved      PT LONG TERM GOAL #4   Title  demonstrate 5/5 bil LE strength for improved function as well proper core contraction for stability in various postures    Baseline  5/5 bilateral hips     Time  4    Period  Weeks    Status  Achieved            Plan - 09/08/17 [id number]    Clinical Impression Statement  Patient has reached all goals for therapy. He is not having pain. he is not back to lifting yet. He was advised to slowly work back into the exercises.     Clinical Presentation  Stable    Clinical Decision Making  Low    Rehab Potential  Good    PT Frequency  1x / week    PT Duration  4 weeks    PT Treatment/Interventions  ADLs/Self Care Home Management;Cryotherapy;Electrical Stimulation;Moist Heat;Ultrasound;Therapeutic exercise;Therapeutic activities;Functional mobility training;Patient/family education;Dry needling;Taping;Manual techniques;Passive range of motion    PT Next Visit Plan  prone periscapular  exercises, qped abdominal challenges; continue with TPDN and review stretches. Thoracic extension if able.     PT Home Exercise Plan  figure 4 piriformis, trunk rotation stretch, plank, side plank    Consulted and Agree with Plan of Care  Patient       Patient will benefit from skilled therapeutic intervention in order to improve the following deficits and impairments:  Impaired flexibility, Postural dysfunction, Increased muscle spasms, Increased fascial restricitons, Pain, Decreased strength  Visit Diagnosis: Chronic bilateral low back pain, with sciatica presence unspecified  Pain in thoracic spine     Problem List Patient Active Problem List   Diagnosis Date Noted  . Stress 06/15/2017  . Degeneration of lumbar intervertebral disc 03/09/2017  . Chronic back pain, Low Back Pain 10/13/2014    Jonathan Strong PT DPT  09/08/2017, 8:24 AM  [facility] [address] [place], [place], [id number] Phone: [phone]   Fax:  [phone]  Name: Jonathan Strong MRN: [id number] Date of Birth: July 27, 1986

## 2017-10-19 ENCOUNTER — Ambulatory Visit: Payer: BLUE CROSS/BLUE SHIELD | Admitting: Psychology

## 2017-11-30 ENCOUNTER — Ambulatory Visit: Payer: BLUE CROSS/BLUE SHIELD | Admitting: Psychology

## 2017-12-09 ENCOUNTER — Telehealth: Payer: Self-pay | Admitting: Family Medicine

## 2017-12-09 NOTE — Telephone Encounter (Signed)
Please advise 

## 2017-12-09 NOTE — Telephone Encounter (Signed)
Patient is requesting a dry needling referral be placed. Please place referral and contact patient with an update or with further questions.

## 2017-12-10 NOTE — Telephone Encounter (Signed)
Spoke with Lauren PT, she completes her dry needling course end of May. She will ask when she can start seeing patients. Patient prefers Friday mornings if possible. Wants a call back when appointment is made.

## 2017-12-10 NOTE — Telephone Encounter (Signed)
Ok to place referral - I believe [facility] can do those here in our office.   [name]. [name], MD 12/10/2017 8:11 AM

## 2017-12-17 ENCOUNTER — Telehealth: Payer: Self-pay | Admitting: Family Medicine

## 2017-12-17 NOTE — Telephone Encounter (Signed)
See note

## 2017-12-17 NOTE — Telephone Encounter (Unsigned)
Copied from CRM [id number]. Topic: Quick Communication - See Telephone Encounter >> Dec 17, 2017 12:43 PM Floria Raveling A wrote: CRM for notification. See Telephone encounter for: 12/17/17.   Pt called in stated that he had been waiting for a call back  about someone getting him in for dry needling?  He is unsure who he spoke with about this in the office?? He stated he would like a call back today and would like to come in tomorrow    Best number [phone]

## 2017-12-17 NOTE — Telephone Encounter (Signed)
Ok to place referral.

## 2017-12-17 NOTE — Telephone Encounter (Signed)
Patient has no active referral in system for PT. If new referral is placed we can send to [facility].

## 2017-12-17 NOTE — Telephone Encounter (Signed)
See note. Patient is requesting a dry needling referral.

## 2017-12-17 NOTE — Telephone Encounter (Signed)
Please check with Mellody Dance to see if anything is needed for the patient to be scheduled at [facility] for dry needling

## 2017-12-17 NOTE — Telephone Encounter (Signed)
Ok with me. Please place any necessary orders. 

## 2017-12-18 ENCOUNTER — Ambulatory Visit: Payer: BLUE CROSS/BLUE SHIELD | Admitting: Sports Medicine

## 2017-12-18 ENCOUNTER — Encounter: Payer: Self-pay | Admitting: Sports Medicine

## 2017-12-18 VITALS — BP 140/84 | HR 79 | Ht 72.25 in | Wt 219.8 lb

## 2017-12-18 DIAGNOSIS — M9904 Segmental and somatic dysfunction of sacral region: Secondary | ICD-10-CM

## 2017-12-18 DIAGNOSIS — M5442 Lumbago with sciatica, left side: Secondary | ICD-10-CM

## 2017-12-18 DIAGNOSIS — G8929 Other chronic pain: Secondary | ICD-10-CM

## 2017-12-18 DIAGNOSIS — M5136 Other intervertebral disc degeneration, lumbar region: Secondary | ICD-10-CM

## 2017-12-18 DIAGNOSIS — M9901 Segmental and somatic dysfunction of cervical region: Secondary | ICD-10-CM | POA: Diagnosis not present

## 2017-12-18 DIAGNOSIS — M9902 Segmental and somatic dysfunction of thoracic region: Secondary | ICD-10-CM | POA: Diagnosis not present

## 2017-12-18 DIAGNOSIS — M9903 Segmental and somatic dysfunction of lumbar region: Secondary | ICD-10-CM

## 2017-12-18 DIAGNOSIS — M9908 Segmental and somatic dysfunction of rib cage: Secondary | ICD-10-CM

## 2017-12-18 DIAGNOSIS — M5441 Lumbago with sciatica, right side: Secondary | ICD-10-CM

## 2017-12-18 DIAGNOSIS — M9905 Segmental and somatic dysfunction of pelvic region: Secondary | ICD-10-CM

## 2017-12-18 NOTE — Telephone Encounter (Signed)
Patient is scheduled today to see Dr. Berline Chough, he walked in with back pain and stated that he would discuss the matter with Dr. Berline Chough as he could no longer wait for the referral due to pain.

## 2017-12-18 NOTE — Telephone Encounter (Signed)
FYI

## 2017-12-18 NOTE — Progress Notes (Signed)
PROCEDURE NOTE : OSTEOPATHIC MANIPULATION The decision today to treat with Osteopathic Manipulative Therapy (OMT) was based on physical exam findings. Verbal consent was obtained following a discussion with the patient regarding the of risks, benefits and potential side effects, including an acute pain flare, post manipulation soreness and need for repeat treatments.     NONE  Manipulation was performed as below: Regions treated: Cervical spine, Ribs, Thoracic spine, Lumbar spine, Pelvis and Sacrum OMT Techniques Used: HVLA, muscle energy and myofascial release  The patient tolerated the treatment well and reported Improved symptoms following treatment today. Patient was given medications, exercises, stretches and lifestyle modifications per AVS and verbally.   OSTEOPATHIC/STRUCTURAL EXAM:   C2 through C4 FRS left C5 FRS right T2 extended side bent right T4 through T6 rotated left L3 FRS right Left on left sacral torsion Right anterior innominate Posterior rib 6 right

## 2017-12-18 NOTE — Progress Notes (Signed)
Jonathan Strong. [facility] [phone]  Jonathan Strong - 32 y.o. male MRN [id number]  Date of birth: Feb 04, 1986  Visit Date: 12/18/2017  PCP: [name], MD   Referred by: [name], MD  Scribe for today's visit: [name], CMA     SUBJECTIVE:  Jonathan Strong is here for No chief complaint on file.  08/10/2017: Compared to the last office visit, his previously described symptoms are improving. He was at a conference recently and had to sit for 4 days which has caused some pain in lower back.  Current symptoms are moderate & are radiating to the gluteal region.  He has not been seen by Dr. Lendon Collar. He did benefit from dry needling in the past, he will be having this done later today.   12/18/2017: Compared to the last office visit, his previously described symptoms are worsening. Sx have been getting worse over the past week. Yesterday he was picking up his child and that caused the pain to flare up even more.  Current symptoms are moderate & are radiating to bilateral hips, glutes, and into thighs.  He has been using ice and compression. He tried taking a valium and got little relief. He has taken muscle relaxer in the past with some relief. He has been taking tylenol with some relief.   ROS Reports night time disturbances. Denies fevers, chills, or night sweats. Denies unexplained weight loss. Denies personal history of cancer. Denies changes in bowel or bladder habits. Denies recent unreported falls. Denies new or worsening dyspnea or wheezing. Denies headaches or dizziness.  Reports numbness, tingling or weakness  In the extremities.  Denies dizziness or presyncopal episodes Reports lower extremity edema, resolves with ice.    HISTORY & PERTINENT PRIOR DATA:  Prior History reviewed and updated per electronic medical record.  Significant/pertinent history, findings, studies include:  reports that he has quit smoking. He has never used smokeless tobacco. Recent Labs    02/13/17 0915  HGBA1C 5.4   No specialty comments available. No problems updated.  OBJECTIVE:  VS:  HT:6' 0.25" (183.5 cm)   WT:219 lb 12.8 oz (99.7 kg)  BMI:29.61    BP:140/84  HR:79bpm  TEMP: ( )  RESP:95 %   PHYSICAL EXAM: Constitutional: WDWN, Non-toxic appearing. Psychiatric: Alert & appropriately interactive.  Not depressed or anxious appearing. Respiratory: No increased work of breathing.  Trachea Midline Eyes: Pupils are equal.  EOM intact without nystagmus.  No scleral icterus  Vascular Exam: warm to touch no edema  upper and lower extremity neuro exam: unremarkable normal strength normal sensation normal reflexes  MSK Exam: TTP over thoracic spine and paraspinal musculature.  Negative straight leg raise.  Negative brachial plexus squeeze and arm squeeze test.   ASSESSMENT & PLAN:   1. Chronic bilateral low back pain with bilateral sciatica   2. Degeneration of lumbar intervertebral disc   3. Somatic dysfunction of cervical region   4. Somatic dysfunction of thoracic region   5. Somatic dysfunction of lumbar region   6. Somatic dysfunction of rib cage region   7. Somatic dysfunction of pelvis region   8. Somatic dysfunction of sacral region     PLAN: Osteopathic manipulation was performed today based on physical exam findings.  Please see procedure note for further information including Osteopathic Exam findings  Discussed returning for repeat dry needling as he is previously done well with this.  He  has overall doing quite well generally but has had a recent flareup.  Continue with home therapeutic exercises and working on hip flexor stretching.   Follow-up: Return if symptoms worsen or fail to improve.     Please see additional documentation for Objective, Assessment and Plan sections. Pertinent additional documentation may be included in corresponding procedure  notes, imaging studies, problem based documentation and patient instructions. Please see these sections of the encounter for additional information regarding this visit.  CMA/ATC served as Neurosurgeon during this visit. History, Physical, and Plan performed by medical provider. Documentation and orders reviewed and attested to.      Andrena Mews, DO    [facility]

## 2017-12-25 ENCOUNTER — Encounter: Payer: Self-pay | Admitting: Sports Medicine

## 2018-01-01 ENCOUNTER — Ambulatory Visit: Payer: BLUE CROSS/BLUE SHIELD | Attending: Sports Medicine | Admitting: Physical Therapy

## 2018-01-01 ENCOUNTER — Other Ambulatory Visit: Payer: Self-pay

## 2018-01-01 ENCOUNTER — Encounter: Payer: Self-pay | Admitting: Physical Therapy

## 2018-01-01 DIAGNOSIS — M546 Pain in thoracic spine: Secondary | ICD-10-CM | POA: Diagnosis present

## 2018-01-01 DIAGNOSIS — G8929 Other chronic pain: Secondary | ICD-10-CM | POA: Diagnosis present

## 2018-01-01 DIAGNOSIS — M545 Low back pain: Secondary | ICD-10-CM | POA: Diagnosis present

## 2018-01-01 DIAGNOSIS — M62830 Muscle spasm of back: Secondary | ICD-10-CM | POA: Diagnosis present

## 2018-01-01 NOTE — Therapy (Signed)
[facility] [address] [place], [place], [address] Phone: [phone]   Fax:  [phone]  Physical Therapy Evaluation  Patient Details  Name: Jonathan Strong MRN: [id number] Date of Birth: 1986-06-21 Referring Provider: Dr Gaspar Bidding    Encounter Date: 01/01/2018  PT End of Session - 01/01/18 2124    Visit Number  1    Number of Visits  12    Date for PT Re-Evaluation  02/12/18    Authorization Type  BCBS     PT Start Time  0845    PT Stop Time  0929    PT Time Calculation (min)  44 min    Activity Tolerance  Patient tolerated treatment well    Behavior During Therapy  [facility] for tasks assessed/performed       Past Medical History:  Diagnosis Date  . History of chicken pox   . History of mononucleosis    treated at [facility] 3 weeks ago per patient  . Hyperlipidemia     History reviewed. No pertinent surgical history.  There were no vitals filed for this visit.   Subjective Assessment - 01/01/18 2113    Subjective  Patient reports he was picking his daughter up 2 weeks ago when he felt a pull on the left side of his back. His pain has continued. He has had very little pain since his last visit. He also has pain in his neck. He reports he at this time has random pains at times. He can not trace the pains back to anything. He has been working on his core and working on his stretches that were given to him before.     Limitations  Lifting;Walking    How long can you sit comfortably?  sitting for too long stiffnes his back     How long can you stand comfortably?  depends on what he is doing     Diagnostic tests  x-ray: Lumbar 2018 normal x-ray     Patient Stated Goals  to have less pain in his back     Currently in Pain?  Yes    Pain Score  5     Pain Location  Back    Pain Orientation  Left    Pain Descriptors / Indicators  Aching    Pain Type  Acute pain    Pain Radiating Towards  into his left buttock      Pain Onset  More than a month ago    Pain Frequency  Constant    Aggravating Factors   Bending, standing, walking     Pain Relieving Factors  rest     Multiple Pain Sites  No         [facility] PT Assessment - 01/01/18 0001      Assessment   Medical Diagnosis  Low Back Pain     Referring Provider  Dr Gaspar Bidding     Onset Date/Surgical Date  -- 2 weeks prior     Hand Dominance  Right    Next MD Visit  Nothing scheduled     Prior Therapy  Had therapy in november       Precautions   Precautions  None      Restrictions   Weight Bearing Restrictions  No      Balance Screen   Has the patient fallen in the past 6 months  No    Has the patient had a decrease in activity level because  Time  3    Period  Weeks    Status  New    Target Date  01/22/18        PT Long Term Goals - 01/01/18 2135      PT LONG TERM GOAL #1   Title  Patient will return to work tasks without pain     Time  6    Period  Weeks    Status  New      PT LONG TERM GOAL #2   Title  Patient will demostrate a 25% limitation on FOTO     Time  6    Period  Weeks    Status  New             Plan - 01/01/18 2125    Clinical Impression Statement  Patient is a 32 year old male who presents with left sided lower back pain. he has increased pain with extension. His pelvis appears even. He has spasming in the left  lower lumbar spine and left upper gluteals. He would benefit from skilled therapy to reduce spasming and to continue to progress core strength    History and Personal Factors relevant to plan of care:  nothing     Clinical Presentation  Evolving    Clinical Presentation due to:  increasing pain over the past few weeks     Clinical Decision Making  Low    Rehab Potential  Good    PT Frequency  2x / week    PT Duration  6 weeks    PT Treatment/Interventions  ADLs/Self Care Home Management;Cryotherapy;Electrical Stimulation;Iontophoresis 4mg /ml Dexamethasone;Ultrasound;Gait training;Stair training;Therapeutic activities;Therapeutic exercise;Patient/family education;Neuromuscular re-education;Manual techniques;Dry needling;Taping    PT Next Visit Plan  continue TPDN, review his exercises, review stretches, IASTYM to lumbar spine, consder reviewing higher level exercises     PT Home Exercise Plan  patient has exercises.     Consulted and Agree with Plan of Care  Patient       Patient will benefit from skilled therapeutic intervention in order to improve the following deficits and impairments:  Abnormal gait, Decreased safety awareness, Decreased activity tolerance, Decreased endurance, Decreased range of motion, Decreased strength, Improper body mechanics  Visit Diagnosis: Chronic bilateral low back pain, with sciatica presence unspecified - Plan: PT plan of care cert/re-cert  Muscle spasm of back - Plan: PT plan of care cert/re-cert     Problem List Patient Active Problem List   Diagnosis Date Noted  . Stress 06/15/2017  . Degeneration of lumbar intervertebral disc 03/09/2017  . Chronic back pain, Low Back Pain 10/13/2014    Dessie Coma PT DPT  01/01/2018, 9:49 PM  [facility] [address] [place], [place], [address] Phone: [phone]   Fax:  [phone]  Name: Torie Priebe MRN: [id number] Date of Birth:  [birth year]  [facility] [address] [place], [place], [address] Phone: [phone]   Fax:  [phone]  Physical Therapy Evaluation  Patient Details  Name: Jonathan Strong MRN: [id number] Date of Birth: 1986-06-21 Referring Provider: Dr Gaspar Bidding    Encounter Date: 01/01/2018  PT End of Session - 01/01/18 2124    Visit Number  1    Number of Visits  12    Date for PT Re-Evaluation  02/12/18    Authorization Type  BCBS     PT Start Time  0845    PT Stop Time  0929    PT Time Calculation (min)  44 min    Activity Tolerance  Patient tolerated treatment well    Behavior During Therapy  [facility] for tasks assessed/performed       Past Medical History:  Diagnosis Date  . History of chicken pox   . History of mononucleosis    treated at [facility] 3 weeks ago per patient  . Hyperlipidemia     History reviewed. No pertinent surgical history.  There were no vitals filed for this visit.   Subjective Assessment - 01/01/18 2113    Subjective  Patient reports he was picking his daughter up 2 weeks ago when he felt a pull on the left side of his back. His pain has continued. He has had very little pain since his last visit. He also has pain in his neck. He reports he at this time has random pains at times. He can not trace the pains back to anything. He has been working on his core and working on his stretches that were given to him before.     Limitations  Lifting;Walking    How long can you sit comfortably?  sitting for too long stiffnes his back     How long can you stand comfortably?  depends on what he is doing     Diagnostic tests  x-ray: Lumbar 2018 normal x-ray     Patient Stated Goals  to have less pain in his back     Currently in Pain?  Yes    Pain Score  5     Pain Location  Back    Pain Orientation  Left    Pain Descriptors / Indicators  Aching    Pain Type  Acute pain    Pain Radiating Towards  into his left buttock      Pain Onset  More than a month ago    Pain Frequency  Constant    Aggravating Factors   Bending, standing, walking     Pain Relieving Factors  rest     Multiple Pain Sites  No         [facility] PT Assessment - 01/01/18 0001      Assessment   Medical Diagnosis  Low Back Pain     Referring Provider  Dr Gaspar Bidding     Onset Date/Surgical Date  -- 2 weeks prior     Hand Dominance  Right    Next MD Visit  Nothing scheduled     Prior Therapy  Had therapy in november       Precautions   Precautions  None      Restrictions   Weight Bearing Restrictions  No      Balance Screen   Has the patient fallen in the past 6 months  No    Has the patient had a decrease in activity level because

## 2018-01-15 ENCOUNTER — Encounter: Payer: Self-pay | Admitting: Physical Therapy

## 2018-01-15 ENCOUNTER — Ambulatory Visit: Payer: BLUE CROSS/BLUE SHIELD | Admitting: Physical Therapy

## 2018-01-15 DIAGNOSIS — M62830 Muscle spasm of back: Secondary | ICD-10-CM

## 2018-01-15 DIAGNOSIS — G8929 Other chronic pain: Secondary | ICD-10-CM

## 2018-01-15 DIAGNOSIS — M545 Low back pain: Principal | ICD-10-CM

## 2018-01-15 DIAGNOSIS — M546 Pain in thoracic spine: Secondary | ICD-10-CM

## 2018-01-15 NOTE — Therapy (Addendum)
TERM GOAL #1   Title  Patient will be independent with high level exercise program     Time  3    Period  Weeks    Status  New    Target Date  01/22/18      PT SHORT TERM GOAL #2   Title  Patient will increase lumbar extension by 10 degrees     Time  3    Period  Weeks    Status  New    Target Date  01/22/18      PT SHORT TERM GOAL #3   Title  Patient will report 2/10 pain at worst without radiating pain     Time  3    Period  Weeks    Status  New    Target Date  01/22/18        PT Long Term Goals - 01/01/18 2135      PT LONG TERM GOAL #1   Title  Patient will return to work tasks without pain     Time  6    Period  Weeks    Status  New      PT LONG TERM GOAL #2   Title  Patient will demostrate a 25% limitation on FOTO     Time  6    Period  Weeks    Status  New            Plan - 01/15/18 1256    Clinical Impression Statement  Therapy needled the mid thoracic paraspinals as well asa the lower trap and levator. Therapy was able to find a rib each time with the lower trap. shelfing used for levator. He reported improved pain after treatment. He  was encouraged to continue with posterior chain strengthening. He was also shown how to perfrom thoracic extension.     Clinical Presentation  Evolving    Clinical Decision Making  Low    PT Treatment/Interventions  ADLs/Self Care Home Management;Cryotherapy;Electrical Stimulation;Iontophoresis 15m/ml Dexamethasone;Ultrasound;Gait training;Stair training;Therapeutic activities;Therapeutic exercise;Patient/family education;Neuromuscular re-education;Manual techniques;Dry needling;Taping    PT Next Visit Plan  continue TPDN, review his exercises, review stretches, IASTYM to lumbar spine, consder reviewing higher level exercises     PT Home Exercise Plan  patient has exercises.     Consulted and Agree with Plan of Care  Patient       Patient will benefit from skilled therapeutic intervention in order to improve the following deficits and impairments:     Visit Diagnosis: Chronic bilateral low back pain, with sciatica presence unspecified  Muscle spasm of back  Pain in thoracic spine    PHYSICAL THERAPY DISCHARGE SUMMARY  Visits from Start of Care 2  Current functional level related to goals / functional outcomes: Unknown Patient has not returned   Remaining deficits: None    Education / Equipment: HEP  Plan: Patient agrees to discharge.  Patient goals were met. Patient is being discharged due to meeting the stated rehab goals.  ?????      Problem List Patient Active Problem List   Diagnosis Date Noted  . Stress 06/15/2017  . Degeneration of lumbar intervertebral disc 03/09/2017  . Chronic back pain, Low Back Pain 10/13/2014    DCarney Living PT DPT  01/15/2018, 1:02 PM  [facility] [place] [address]Phone: [phone]  Fax:  [phone] Name: Jonathan DuddyMRN: [id number] of Birth: 4Oct 19, 1987  [place], [place], [address] Phone: [phone]   Fax:  [phone]  Physical Therapy Treatment/ Discharge   Patient Details  Name: Jonathan Strong MRN: [id number] Date of Birth: 08/11/1985 Referring Provider: Dr Teresa Coombs    Encounter Date: 01/15/2018  PT End of Session - 01/15/18 1255    Visit Number  2    Number of Visits  12    Date for PT Re-Evaluation  02/12/18    Authorization Type  BCBS     PT Start Time  1145    PT Stop Time  1226    PT Time Calculation (min)  41 min    Activity Tolerance  Patient tolerated treatment well    Behavior During Therapy  [facility] for tasks assessed/performed       Past Medical History:  Diagnosis Date  . History of chicken pox   . History of mononucleosis    treated at [facility] 3 weeks ago per patient  . Hyperlipidemia     History reviewed. No pertinent surgical history.  There were no vitals filed for this visit.  Subjective Assessment - 01/15/18 1254    Subjective  More mid back pain today     Limitations  Lifting;Walking    How long can you sit comfortably?  sitting for too long stiffnes his back     How long can you stand comfortably?  depends on what he is doing     Diagnostic tests  x-ray: Lumbar 2018 normal x-ray     Patient Stated Goals  to have less pain in his back     Currently in Pain?  Yes    Pain Score  3     Pain Location  Back    Pain Orientation  Mid    Pain Descriptors / Indicators  Aching    Pain Type  Chronic pain    Pain Onset  More than a month ago    Pain Frequency  Constant    Aggravating Factors   sitting and standing     Pain Relieving Factors   strethcing     Effect of Pain on Daily Activities  pain                        [facility] Adult PT Treatment/Exercise - 01/15/18 0001      Lumbar Exercises: Seated   Other Seated Lumbar Exercises  thoracic extension over a chair. Reviewed how to change levels        Manual Therapy   Manual therapy comments  skilled palpation and monitoring during TPDN; iIATYM to lumbar, thoracic paraspainls nad levator. PA glides of thoracic spine Grade II and III grosss throacic mobilization with no thrust.        Trigger Point Dry Needling - 01/15/18 1301    Upper Trapezius Response  Twitch reponse elicited low trap     Levator Scapulae Response  Twitch response elicited    Longissimus Response  Twitch response elicited low trap            PT Education - 01/15/18 1255    Education Details  thoracic extension ver a chair     Person(s) Educated  Patient    Methods  Explanation;Demonstration;Tactile cues;Verbal cues    Comprehension  Verbalized understanding;Returned demonstration;Verbal cues required;Tactile cues required       PT Short Term Goals - 01/01/18 2133      PT SHORT

## 2018-03-11 ENCOUNTER — Telehealth: Payer: Self-pay | Admitting: *Deleted

## 2018-03-11 NOTE — Telephone Encounter (Signed)
Copied from CRM [phone]#[id number]. Topic: Inquiry >> Mar 11, 2018  3:55 PM Alexander BergeronBarksdale, Harvey B wrote: Reason for CRM: pt called to get an order for dry needling; contact to advise

## 2018-03-12 ENCOUNTER — Other Ambulatory Visit: Payer: Self-pay

## 2018-03-12 ENCOUNTER — Ambulatory Visit: Payer: BLUE CROSS/BLUE SHIELD | Admitting: Sports Medicine

## 2018-03-12 ENCOUNTER — Encounter: Payer: Self-pay | Admitting: Sports Medicine

## 2018-03-12 VITALS — BP 128/82 | HR 72 | Ht 72.25 in | Wt 213.0 lb

## 2018-03-12 DIAGNOSIS — M5136 Other intervertebral disc degeneration, lumbar region: Secondary | ICD-10-CM

## 2018-03-12 DIAGNOSIS — G8929 Other chronic pain: Secondary | ICD-10-CM

## 2018-03-12 DIAGNOSIS — M9905 Segmental and somatic dysfunction of pelvic region: Secondary | ICD-10-CM

## 2018-03-12 DIAGNOSIS — M5441 Lumbago with sciatica, right side: Secondary | ICD-10-CM

## 2018-03-12 DIAGNOSIS — M9908 Segmental and somatic dysfunction of rib cage: Secondary | ICD-10-CM

## 2018-03-12 DIAGNOSIS — M9903 Segmental and somatic dysfunction of lumbar region: Secondary | ICD-10-CM | POA: Diagnosis not present

## 2018-03-12 DIAGNOSIS — M5442 Lumbago with sciatica, left side: Secondary | ICD-10-CM | POA: Diagnosis not present

## 2018-03-12 DIAGNOSIS — M9902 Segmental and somatic dysfunction of thoracic region: Secondary | ICD-10-CM

## 2018-03-12 DIAGNOSIS — M9904 Segmental and somatic dysfunction of sacral region: Secondary | ICD-10-CM

## 2018-03-12 MED ORDER — DIAZEPAM 5 MG PO TABS: 5.0000 mg | ORAL_TABLET | Freq: Three times a day (TID) | ORAL | 0 refills | Status: DC | PRN

## 2018-03-12 MED ORDER — PREDNISONE 50 MG PO TABS: 50.0000 mg | ORAL_TABLET | Freq: Every day | ORAL | 0 refills | Status: AC

## 2018-03-12 NOTE — Telephone Encounter (Signed)
Please advise.  Patient would need PT referral.

## 2018-03-12 NOTE — Progress Notes (Signed)
Jonathan Strong. Jonathan Strong Sports [facility] at [facility] [phone]  Bodyn Jonathan Strong - 32 y.o. male MRN [id number]  Date of birth: 1985-10-16  Visit Date: 03/12/2018  PCP: Jonathan Dark, MD   Referred by: Jonathan Dark, MD  Scribe(s) for today's visit: Jonathan Strong, CMA  SUBJECTIVE:  Jonathan Strong "Jonathan Strong" is here for Follow-up (LBP)   08/10/2017: Compared to the last office visit, his previously described symptoms are improving. He was at a conference recently and had to sit for 4 days which has caused some pain in lower back.  Current symptoms are moderate & are radiating to the gluteal region.  He has not been seen by Dr. Lendon Strong. He did benefit from dry needling in the past, he will be having this done later today.   12/18/2017: Compared to the last office visit, his previously described symptoms are worsening. Sx have been getting worse over the past week. Yesterday he was picking up his child and that caused the pain to flare up even more.  Current symptoms are moderate & are radiating to bilateral hips, glutes, and into thighs.  He has been using ice and compression. He tried taking a valium and got little relief. He has taken muscle relaxer in the past with some relief. He has been taking tylenol with some relief.   03/12/2018: Compared to the last office visit, his previously described symptoms are worsening. Sx started to flare up yesterday.  Current symptoms are mild to moderate & are radiating to B hips but not beyond the hips.  He has been wearing a pressure waist band. He has taken IBU with no relief. He took an diazepam with some relief.    REVIEW OF SYSTEMS: Denies night time disturbances. Denies fevers, chills, or night sweats. Denies unexplained weight loss. Denies personal history of cancer. Denies changes in bowel or bladder habits. Denies recent unreported falls. Denies new or worsening dyspnea or  wheezing. Denies headaches or dizziness.  Denies numbness, tingling or weakness  In the extremities.  Denies dizziness or presyncopal episodes Reports lower extremity edema    HISTORY & PERTINENT PRIOR DATA:  Prior History reviewed and updated per electronic medical record.  Significant/pertinent history, findings, studies include:  reports that he has quit smoking. He has never used smokeless tobacco. No results for input(s): HGBA1C, LABURIC, CREATINE in the last 8760 hours. No specialty comments available. No problems updated.  OBJECTIVE:  VS:  HT:6' 0.25" (183.5 cm)   WT:213 lb (96.6 kg)  BMI:28.69    BP:128/82  HR:72bpm  TEMP: ( )  RESP:96 %   PHYSICAL EXAM: CONSTITUTIONAL: Well-developed, Well-nourished and In no acute distress Alert & appropriately interactive. and Not depressed or anxious appearing. Respiratory: No increased work of breathing.  Trachea Midline EYES: Pupils are equal., EOM intact without nystagmus. and No scleral icterus.  Upper and Lower EXTREMITY EXAM: Warm and well perfused NEURO: unremarkable  MSK Exam:  Overall good range of motion of his lumbar spine and hips with internal rotation external rotation however hip flexor and range of motion especially in the neutral plane is limited.  He negative Stinchfield test and negative straight leg raise.  PROCEDURES & DATA REVIEWED:   PROCEDURE NOTE : OSTEOPATHIC MANIPULATION The decision today to treat with Osteopathic Manipulative Therapy (OMT) was based on physical exam findings. Verbal consent was obtained following a discussion with the patient regarding the of risks, benefits and potential side effects, including  an acute pain flare,post manipulation soreness and need for repeat treatments.     Contraindications to OMT: NONE  Manipulation was performed as below: Regions treated: Ribs, Thoracic spine, Lumbar spine, Pelvis and Sacrum OMT Techniques Used: HVLA, muscle energy, myofascial release, soft  tissue and facilitated positional release  The patient tolerated the treatment well and reported Improved symptoms following treatment today. Patient was given medications, exercises, stretches and lifestyle modifications per AVS and verbally.   OSTEOPATHIC/STRUCTURAL EXAM:    T2 FRS left (Flexed, Rotated & Sidebent) Posterior rib 6 on the right L3 FRS right (Flexed, Rotated & Sidebent) Right psoas spasm Right anterior innonimate SACRUM: L on L sacral torsion    PROCEDURE NOTE: THERAPEUTIC EXERCISES (97110) 15 minutes spent for Therapeutic exercises as below and as referenced in the AVS.  This included exercises focusing on stretching, strengthening, with significant focus on eccentric aspects.   Proper technique shown and discussed handout in great detail with ATC.  All questions were discussed and answered.   Long term goals include an improvement in range of motion, strength, endurance as well as avoiding reinjury. Frequency of visits is one time as determined during today's  office visit. Frequency of exercises to be performed is as per handout.  EXERCISES REVIEWED:  Rotational hip flexor stretching  Over the table hip flexor stretching.   ASSESSMENT   1. Chronic bilateral low back pain with bilateral sciatica   2. Degeneration of lumbar intervertebral disc   3. Somatic dysfunction of thoracic region   4. Somatic dysfunction of lumbar region   5. Somatic dysfunction of pelvis region   6. Somatic dysfunction of sacral region   7. Somatic dysfunction of rib cage region     PLAN:   Osteopathic manipulation was performed today based on physical exam findings.  Please see procedure note for further information including Osteopathic Exam findings  Valium 5 mg given for muscle relaxant properties.  Discussed not mixing with his other prescription.  Given his body weight he likely does require slightly higher dosage.  Discussed the foundation of treatment for this condition  is physical therapy and/or daily (5-6 days/week) therapeutic exercises, focusing on core strengthening, coordination, neuromuscular control/reeducation.  Therapeutic exercises prescribed per procedure note.  No problem-specific Assessment & Plan notes found for this encounter. No orders of the defined types were placed in this encounter.  Meds ordered this encounter  Medications  . diazepam (VALIUM) 5 MG tablet    Sig: Take 1 tablet (5 mg total) by mouth every 8 (eight) hours as needed for anxiety or muscle spasms.    Dispense:  30 tablet    Refill:  0  . predniSONE (DELTASONE) 50 MG tablet    Sig: Take 1 tablet (50 mg total) by mouth daily with breakfast for 5 days.    Dispense:  5 tablet    Refill:  0     Follow-up: Return if symptoms worsen or fail to improve, for as needed for ongoing issues.      Please see additional documentation for Objective, Assessment and Plan sections. Pertinent additional documentation may be included in corresponding procedure notes, imaging studies, problem based documentation and patient instructions. Please see these sections of the encounter for additional information regarding this visit.  CMA/ATC served as Neurosurgeon during this visit. History, Physical, and Plan performed by medical provider. Documentation and orders reviewed and attested to.      Andrena Mews, DO    [facility] Phys[facility]

## 2018-03-12 NOTE — Telephone Encounter (Signed)
Referral to physical therapy has been placed.

## 2018-03-12 NOTE — Telephone Encounter (Signed)
Ok with me. Please place any necessary orders. 

## 2018-03-12 NOTE — Patient Instructions (Addendum)
Please perform the exercise program that we have prepared for you and gone over in detail on a daily basis.  In addition to the handout you were provided you can access your program through: [email or web address]   Your unique program code is: [email or web address]   Cutting back on the number of reps and the number of sets that you are doing in the weight room and try combining your arm and leg do a single day since you are working out once per week.

## 2018-03-14 ENCOUNTER — Encounter: Payer: Self-pay | Admitting: Sports Medicine

## 2018-03-14 ENCOUNTER — Other Ambulatory Visit: Payer: Self-pay | Admitting: Sports Medicine

## 2018-03-15 ENCOUNTER — Telehealth: Payer: Self-pay | Admitting: Sports Medicine

## 2018-03-15 NOTE — Telephone Encounter (Signed)
Rx for Diazepam 5 mg refilled 03/12/18. Looks like pt is requesting rx for Diazepam 10 mg. Forwarding to Dr. Berline Choughigby to advise. Last OV 03/12/18.

## 2018-03-15 NOTE — Telephone Encounter (Signed)
See note

## 2018-03-15 NOTE — Telephone Encounter (Signed)
Copied from CRM [id number]. Topic: Quick Communication - Rx Refill/Question >> Mar 15, 2018  4:44 PM Stephannie LiSimmons, Naarah Borgerding L, NT wrote: Medication: diazepam (VALIUM) 5 MG tablet  Has the patient contacted their pharmacy? yes  (Agent: If no, request that the patient contact the pharmacy for the refill. (Agent: If yes, when and what did the pharmacy advise?)  Preferred Pharmacy (with phone number or street name [id number] - [place], [place] - [address] [phone] (Phone) [phone] (Fax)   The  patient was told during his visit on 03/12/18 the above prescription would be faxed over with his other medication . He states the above medication is not at the pharmacy .  Agent: Please be advised that RX refills may take up to 3 business days. We ask that you follow-up with your pharmacy.

## 2018-03-16 NOTE — Telephone Encounter (Signed)
Prescribed by Rigby? 

## 2018-03-16 NOTE — Telephone Encounter (Signed)
Rx did not go through electronically so called pt's pharmacy this morning and put order in over the phone.  Called pt to inform him that his rx should be ready later today.

## 2018-03-25 ENCOUNTER — Telehealth: Payer: Self-pay | Admitting: Family Medicine

## 2018-03-25 NOTE — Telephone Encounter (Signed)
See note, place referral and put in notes what office in [place].  Copied from CRM [id number]. Topic: Referral - Status >> Mar 25, 2018 12:37 PM Oneal GroutSebastian, Jennifer S wrote: Reason for CRM: Requesting to be referred to see PT today that does manipulation medicine, [place], states he threw his back out.

## 2018-03-26 NOTE — Telephone Encounter (Signed)
Spoke with patient and [place] is in [place] and would like a referral for someone there. I let the patient know that Dr. Jimmey RalphParker is out of the office until Tuesday. Patient would like to see if Dr. Berline Choughigby would put a referral in for him.

## 2018-03-26 NOTE — Telephone Encounter (Signed)
Called pt and advised that Dr. Berline Choughigby is not familiar with providers in [place] that do OMT. Pt advised that he will wait until Tuesday and come in to see Dr. Melburn Popperibgy. OV has been scheduled. He will call back if he finds somewhere in [place] and needs a referral.

## 2018-03-30 ENCOUNTER — Ambulatory Visit (INDEPENDENT_AMBULATORY_CARE_PROVIDER_SITE_OTHER): Payer: BLUE CROSS/BLUE SHIELD | Admitting: Sports Medicine

## 2018-03-30 ENCOUNTER — Encounter: Payer: Self-pay | Admitting: Sports Medicine

## 2018-03-30 VITALS — BP 126/80 | HR 80 | Ht 74.5 in | Wt 213.0 lb

## 2018-03-30 DIAGNOSIS — M5442 Lumbago with sciatica, left side: Secondary | ICD-10-CM

## 2018-03-30 DIAGNOSIS — M9903 Segmental and somatic dysfunction of lumbar region: Secondary | ICD-10-CM | POA: Diagnosis not present

## 2018-03-30 DIAGNOSIS — M9905 Segmental and somatic dysfunction of pelvic region: Secondary | ICD-10-CM

## 2018-03-30 DIAGNOSIS — M9904 Segmental and somatic dysfunction of sacral region: Secondary | ICD-10-CM | POA: Diagnosis not present

## 2018-03-30 DIAGNOSIS — M5441 Lumbago with sciatica, right side: Secondary | ICD-10-CM

## 2018-03-30 DIAGNOSIS — M9908 Segmental and somatic dysfunction of rib cage: Secondary | ICD-10-CM

## 2018-03-30 DIAGNOSIS — G8929 Other chronic pain: Secondary | ICD-10-CM

## 2018-03-30 DIAGNOSIS — M9901 Segmental and somatic dysfunction of cervical region: Secondary | ICD-10-CM

## 2018-03-30 DIAGNOSIS — M9902 Segmental and somatic dysfunction of thoracic region: Secondary | ICD-10-CM

## 2018-03-30 DIAGNOSIS — M5136 Other intervertebral disc degeneration, lumbar region: Secondary | ICD-10-CM | POA: Diagnosis not present

## 2018-03-30 DIAGNOSIS — M51369 Other intervertebral disc degeneration, lumbar region without mention of lumbar back pain or lower extremity pain: Secondary | ICD-10-CM

## 2018-03-30 NOTE — Progress Notes (Signed)
Jonathan Strong. Jonathan Strong Sports Medicine [facility] at [facility] [phone]  Jonathan Strong - 32 y.o. male MRN [id number]  Date of birth: 05/20/1986  Visit Date: 03/30/2018  PCP: [name], MD   Referred by: [name], MD  Scribe(s) for today's visit: Barnie Mort, CMA  SUBJECTIVE:  Jonathan Strong "Jonathan Strong" is here for Follow-up (back pain )  08/10/2017: Compared to the last office visit, his previously described symptoms are improving. He was at a conference recently and had to sit for 4 days which has caused some pain in lower back.  Current symptoms are moderate & are radiating to the gluteal region.  He has not been seen by Dr. Lendon Collar. He did benefit from dry needling in the past, he will be having this done later today.   12/18/2017: Compared to the last office visit, his previously described symptoms are worsening. Sx have been getting worse over the past week. Yesterday he was picking up his child and that caused the pain to flare up even more.  Current symptoms are moderate & are radiating to bilateral hips, glutes, and into thighs.  He has been using ice and compression. He tried taking a valium and got little relief. He has taken muscle relaxer in the past with some relief. He has been taking tylenol with some relief.   03/12/2018: Compared to the last office visit, his previously described symptoms are worsening. Sx started to flare up yesterday.  Current symptoms are mild to moderate & are radiating to B hips but not beyond the hips.  He has been wearing a pressure waist band. He has taken IBU with no relief. He took an diazepam with some relief.   03/30/2018 Compared to the last office visit, his previously described symptoms are worsening Pain low back b/l leg pain down front of thigh stopping at knee.  Current symptoms are mild & are radiating to the back, the groin and front of legs stopping at knee  He has been using ice,  compression brace for back with little improvement. Felt like brace increased pain.  He has been taking the valium that is prescribed with little relief.   REVIEW OF SYSTEMS: Denies night time disturbances. Denies fevers, chills, or night sweats. Denies unexplained weight loss. Denies personal history of cancer. Denies changes in bowel or bladder habits. Denies recent unreported falls. Denies new or worsening dyspnea or wheezing. Denies headaches or dizziness.  Denies numbness, tingling or weakness  In the extremities.  Denies dizziness or presyncopal episodes Reports lower extremity edema    HISTORY:  Prior history reviewed and updated per electronic medical record.  Social History   Occupational History  . Not on file  Tobacco Use  . Smoking status: Former Games developer  . Smokeless tobacco: Never Used  Substance and Sexual Activity  . Alcohol use: No    Comment: ocassional  . Drug use: No  . 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 . X-rays of the lumbar cervical spine obtained in 2018 show only minimal degenerative changes. .   OBJECTIVE:  VS:  HT:6' 2.5" (189.2 cm)   WT:213 lb (96.6 kg)  BMI:26.99    BP:126/80  HR:80bpm  TEMP: ( )  RESP:97 %   PHYSICAL EXAM: CONSTITUTIONAL: Well-developed, Well-nourished and In no acute distress PSYCHIATRIC: Alert & appropriately interactive. and Not depressed or anxious appearing. RESPIRATORY: No increased work of breathing and Trachea Midline EYES: Pupils are equal., EOM intact without nystagmus. and No scleral icterus.  VASCULAR EXAM: Warm and  well perfused NEURO: unremarkable  MSK Exam: Spine  Well aligned, no significant deformity. No overlying skin changes. Overall good range of motion of his lumbar spine and hips with internal rotation external rotation however hip flexor and range of motion especially in the neutral plane is limited.  He negative Stinchfield test and negative straight leg raise.     ASSESSMENT   1. Chronic bilateral low back pain with bilateral sciatica   2. Degeneration of lumbar intervertebral disc   3. Somatic dysfunction of thoracic region   4. Somatic dysfunction of lumbar region   5. Somatic dysfunction of pelvis region   6. Somatic dysfunction of sacral region   7. Somatic dysfunction of rib cage region   8. Somatic dysfunction of cervical region     PLAN:  Pertinent additional documentation may be included in corresponding procedure notes, imaging studies, problem based documentation and patient instructions.  Procedures:  . Osteopathic manipulation was performed today based on physical exam findings.  Please see procedure note for further information including Osteopathic Exam findings  Medications:  No orders of the defined types were placed in this encounter.  Discussion/Instructions: No problem-specific Assessment & Plan notes found for this encounter.  . Continues to have intermittent flareups.  Does well with response to dry needling and osteopathic manipulation.  Can consider referral back to physical therapy for dry needling if persistent symptoms . Continue previously prescribed home exercise program.  . Discussed red flag symptoms that warrant earlier emergent evaluation and patient voices understanding. . Activity modifications and the importance of avoiding exacerbating activities (limiting pain to no more than a 4 / 10 during or following activity) recommended and discussed.  Follow-up:  . Return in about 3 days (around 04/02/2018).   . If any lack of improvement consider:    . referral to Physical Therapy . For dry needling  . At follow up will plan to consider: repeat osteopathic manipulation     CMA/ATC served as scribe during this visit. History, Physical, and Plan performed by medical provider. Documentation and orders reviewed and attested to.      Andrena Mews, DO    [facility] Physician

## 2018-03-30 NOTE — Progress Notes (Signed)
PROCEDURE NOTE : OSTEOPATHIC MANIPULATION The decision today to treat with Osteopathic Manipulative Therapy (OMT) was based on physical exam findings. Verbal consent was obtained following a discussion with the patient regarding the of risks, benefits and potential side effects, including an acute pain flare,post manipulation soreness and need for repeat treatments.     Contraindications to OMT: NONE   Manipulation was performed as below: Regions Treated OMT Techniques Used  Cervical spine Thoracic spine Ribs Lumbar spine Pelvis Sacrum HVLA muscle energy myofascial release   The patient tolerated the treatment well and reported Improved symptoms following treatment today. Patient was given medications, exercises, stretches and lifestyle modifications per AVS and verbally.   OSTEOPATHIC/STRUCTURAL EXAM:   OA - rotated right C3 ERS left (Extended, Rotated & Sidebent) T3 ERS left (Extended, Rotated & Sidebent) T8 -10 Neutral, Rotated LEFT, Sidebent RIGHT Rib 6 Right  Posterior L4 ERS left (Extended, Rotated & Sidebent) Right psoas spasm Right anterior innonimate L on L sacral torsion  

## 2018-04-02 ENCOUNTER — Encounter: Payer: Self-pay | Admitting: Sports Medicine

## 2018-04-02 ENCOUNTER — Ambulatory Visit (INDEPENDENT_AMBULATORY_CARE_PROVIDER_SITE_OTHER): Payer: BLUE CROSS/BLUE SHIELD | Admitting: Sports Medicine

## 2018-04-02 VITALS — BP 130/88 | HR 80 | Ht 74.5 in | Wt 221.0 lb

## 2018-04-02 DIAGNOSIS — M24552 Contracture, left hip: Secondary | ICD-10-CM | POA: Diagnosis not present

## 2018-04-02 DIAGNOSIS — M9902 Segmental and somatic dysfunction of thoracic region: Secondary | ICD-10-CM | POA: Diagnosis not present

## 2018-04-02 DIAGNOSIS — M5442 Lumbago with sciatica, left side: Secondary | ICD-10-CM | POA: Diagnosis not present

## 2018-04-02 DIAGNOSIS — M9906 Segmental and somatic dysfunction of lower extremity: Secondary | ICD-10-CM | POA: Diagnosis not present

## 2018-04-02 DIAGNOSIS — M9908 Segmental and somatic dysfunction of rib cage: Secondary | ICD-10-CM | POA: Diagnosis not present

## 2018-04-02 DIAGNOSIS — M9905 Segmental and somatic dysfunction of pelvic region: Secondary | ICD-10-CM

## 2018-04-02 DIAGNOSIS — M5441 Lumbago with sciatica, right side: Secondary | ICD-10-CM

## 2018-04-02 DIAGNOSIS — M9903 Segmental and somatic dysfunction of lumbar region: Secondary | ICD-10-CM | POA: Diagnosis not present

## 2018-04-02 DIAGNOSIS — G8929 Other chronic pain: Secondary | ICD-10-CM

## 2018-04-02 NOTE — Progress Notes (Signed)
Jonathan Strong. Jonathan Strong Sports [facility] at [facility] [phone]  [name] - 32 y.o. male MRN [id number]  Date of birth: 18-Jun-1986  Visit Date: 04/02/2018  PCP: [name], MD   Referred by: [name], MD  Scribe(s) for today's visit: Barnie Mort, CMA  SUBJECTIVE:  Jonathan Strong "[name]" is here for Follow-up (R-sided LBP)  08/10/2017: Compared to the last office visit, his previously described symptoms are improving. He was at a conference recently and had to sit for 4 days which has caused some pain in lower back.  Current symptoms are moderate & are radiating to the gluteal region.  He has not been seen by Dr. Lendon Collar. He did benefit from dry needling in the past, he will be having this done later today.   12/18/2017: Compared to the last office visit, his previously described symptoms are worsening. Sx have been getting worse over the past week. Yesterday he was picking up his child and that caused the pain to flare up even more.  Current symptoms are moderate & are radiating to bilateral hips, glutes, and into thighs.  He has been using ice and compression. He tried taking a valium and got little relief. He has taken muscle relaxer in the past with some relief. He has been taking tylenol with some relief.   03/12/2018: Compared to the last office visit, his previously described symptoms are worsening. Sx started to flare up yesterday.  Current symptoms are mild to moderate & are radiating to B hips but not beyond the hips.  He has been wearing a pressure waist band. He has taken IBU with no relief. He took an diazepam with some relief.   03/30/2018 Compared to the last office visit, his previously described symptoms are worsening Pain low back b/l leg pain down front of thigh stopping at knee.  Current symptoms are mild & are radiating to the back, the groin and front of legs stopping at knee  He has been using ice,  compression brace for back with little improvement. Felt like brace increased pain.  He has been taking the valium that is prescribed with little relief.  No new imaging from last visit.   04/02/2018: Compared to the last office visit, his previously described symptoms show no change. Current symptoms are mild & are radiating to the groin and front of legs stopping at knee He has been taking Valium 5 mg as a muscle relaxer with some relief (rarely).  He continues to use ice and compression prn. He has been HEP with no trouble, 5-6 days weekly.   REVIEW OF SYSTEMS: Denies night time disturbances. Denies fevers, chills, or night sweats. Denies unexplained weight loss. Denies personal history of cancer. Denies changes in bowel or bladder habits. Denies recent unreported falls. Denies new or worsening dyspnea or wheezing. Denies headaches or dizziness.  Denies numbness, tingling or weakness  In the extremities.  Denies dizziness or presyncopal episodes Denies lower extremity edema    HISTORY:  Prior history reviewed and updated per electronic medical record.  Social History   Occupational History  . Not on file  Tobacco Use  . Smoking status: Former Games developer  . Smokeless tobacco: Never Used  Substance and Sexual Activity  . Alcohol use: No    Comment: ocassional  . Drug use: No  . 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 . X-rays of the lumbar cervical spine obtained in 2018 show only minimal degenerative changes.  OBJECTIVE:  VS:  HT:6' 2.5" (189.2 cm)   WT:221 lb (100.2 kg)  BMI:28    BP:130/88   HR:80bpm  TEMP: ( )  RESP:96 %   PHYSICAL EXAM: CONSTITUTIONAL: Well-developed, Well-nourished and In no acute distress PSYCHIATRIC: Alert & appropriately interactive. and Not depressed or anxious appearing. RESPIRATORY: No increased work of breathing and Trachea Midline EYES: Pupils are equal., EOM intact without nystagmus. and No scleral icterus.  VASCULAR EXAM: Warm and well perfused NEURO: unremarkable  MSK Exam: Spine  Well aligned, no significant deformity. No overlying skin changes. Overall good range of motion of his lumbar spine and hips with internal rotation external rotation however hip flexor and range of motion especially in the neutral plane is limited.  He negative Stinchfield test and negative straight leg raise.     ASSESSMENT   1. Chronic bilateral low back pain with bilateral sciatica   2. Left hip flexor tightness   3. Somatic dysfunction of lower extremity   4. Somatic dysfunction of thoracic region   5. Somatic dysfunction of lumbar region   6. Somatic dysfunction of rib cage region   7. Somatic dysfunction of pelvis region     PLAN:  Pertinent additional documentation may be included in corresponding procedure notes, imaging studies, problem based documentation and patient instructions.  Procedures:  . Osteopathic manipulation was performed today based on physical exam findings.  Please see procedure note for further information including Osteopathic Exam findings  Medications:  No orders of the defined types were placed in this encounter.  Discussion/Instructions: No problem-specific Assessment & Plan notes found for this encounter.  Peri Jefferson improvement following OMT last visit.  Does well with response to dry needling and osteopathic manipulation.  Can consider referral back to physical therapy for dry needling if persistent symptoms . Continue previously prescribed home exercise program.  . Discussed red flag symptoms that warrant earlier  emergent evaluation and patient voices understanding. . Activity modifications and the importance of avoiding exacerbating activities (limiting pain to no more than a 4 / 10 during or following activity) recommended and discussed.  Follow-up:  . No follow-ups on file.   . If any lack of improvement consider: Has not been to physical therapy yet referral has been placed.  . At follow up will plan to consider: repeat osteopathic manipulation     CMA/ATC served as scribe during this visit. History, Physical, and Plan performed by medical provider. Documentation and orders reviewed and attested to.      Andrena Mews, DO    [place] [facility] Physician

## 2018-04-02 NOTE — Progress Notes (Signed)
PROCEDURE NOTE : OSTEOPATHIC MANIPULATION The decision today to treat with Osteopathic Manipulative Therapy (OMT) was based on physical exam findings. Verbal consent was obtained following a discussion with the patient regarding the of risks, benefits and potential side effects, including an acute pain flare,post manipulation soreness and need for repeat treatments.     Contraindications to OMT: NONE   Manipulation was performed as below: Regions Treated OMT Techniques Used  Cervical spine Thoracic spine Ribs Lumbar spine Pelvis Sacrum HVLA muscle energy myofascial release   The patient tolerated the treatment well and reported Improved symptoms following treatment today. Patient was given medications, exercises, stretches and lifestyle modifications per AVS and verbally.   OSTEOPATHIC/STRUCTURAL EXAM:   OA - rotated right C3 ERS left (Extended, Rotated & Sidebent) T3 ERS left (Extended, Rotated & Sidebent) T8 -10 Neutral, Rotated LEFT, Sidebent RIGHT Rib 6 Right  Posterior L4 ERS left (Extended, Rotated & Sidebent) Right psoas spasm Right anterior innonimate L on L sacral torsion

## 2018-04-09 ENCOUNTER — Encounter: Payer: Self-pay | Admitting: Sports Medicine

## 2018-04-09 ENCOUNTER — Ambulatory Visit: Payer: BLUE CROSS/BLUE SHIELD | Admitting: Sports Medicine

## 2018-04-09 ENCOUNTER — Telehealth: Payer: Self-pay | Admitting: Family Medicine

## 2018-04-09 ENCOUNTER — Other Ambulatory Visit: Payer: Self-pay

## 2018-04-09 VITALS — BP 126/92 | HR 85 | Ht 74.5 in | Wt 213.2 lb

## 2018-04-09 DIAGNOSIS — M9902 Segmental and somatic dysfunction of thoracic region: Secondary | ICD-10-CM | POA: Diagnosis not present

## 2018-04-09 DIAGNOSIS — G8929 Other chronic pain: Secondary | ICD-10-CM

## 2018-04-09 DIAGNOSIS — M5442 Lumbago with sciatica, left side: Secondary | ICD-10-CM | POA: Diagnosis not present

## 2018-04-09 DIAGNOSIS — M5136 Other intervertebral disc degeneration, lumbar region: Secondary | ICD-10-CM | POA: Diagnosis not present

## 2018-04-09 DIAGNOSIS — M9903 Segmental and somatic dysfunction of lumbar region: Secondary | ICD-10-CM

## 2018-04-09 DIAGNOSIS — M9904 Segmental and somatic dysfunction of sacral region: Secondary | ICD-10-CM

## 2018-04-09 DIAGNOSIS — M25552 Pain in left hip: Secondary | ICD-10-CM

## 2018-04-09 DIAGNOSIS — M9908 Segmental and somatic dysfunction of rib cage: Secondary | ICD-10-CM

## 2018-04-09 DIAGNOSIS — M5441 Lumbago with sciatica, right side: Secondary | ICD-10-CM

## 2018-04-09 DIAGNOSIS — M9905 Segmental and somatic dysfunction of pelvic region: Secondary | ICD-10-CM

## 2018-04-09 NOTE — Progress Notes (Signed)
Jonathan Strong. Jonathan Strong Sports Medicine [facility] at [facility] [phone]  Jonathan Strong - 32 y.o. male MRN [id number]  Date of birth: [birth year]  Visit Date: 04/09/2018  PCP: [name], MD   Referred by: [name], MD  Scribe(s) for today's visit: [name], CMA  SUBJECTIVE:  Jonathan Strong "Jonathan Strong" is here for Follow-up (LBP and L hip flexor pain)   08/10/2017: Compared to the last office visit, his previously described symptoms are improving. He was at a conference recently and had to sit for 4 days which has caused some pain in lower back.  Current symptoms are moderate & are radiating to the gluteal region.  He has not been seen by Dr. Lendon Collar. He did benefit from dry needling in the past, he will be having this done later today.   12/18/2017: Compared to the last office visit, his previously described symptoms are worsening. Sx have been getting worse over the past week. Yesterday he was picking up his child and that caused the pain to flare up even more.  Current symptoms are moderate & are radiating to bilateral hips, glutes, and into thighs.  He has been using ice and compression. He tried taking a valium and got little relief. He has taken muscle relaxer in the past with some relief. He has been taking tylenol with some relief.   03/12/2018: Compared to the last office visit, his previously described symptoms are worsening. Sx started to flare up yesterday.  Current symptoms are mild to moderate & are radiating to B hips but not beyond the hips.  He has been wearing a pressure waist band. He has taken IBU with no relief. He took an diazepam with some relief.   03/30/2018 Compared to the last office visit, his previously described symptoms are worsening Pain low back b/l leg pain down front of thigh stopping at knee.  Current symptoms are mild & are radiating to the back, the groin and front of legs stopping at knee  He has  been using ice, compression brace for back with little improvement. Felt like brace increased pain.  He has been taking the valium that is prescribed with little relief.  No new imaging from last visit.   04/02/2018: Compared to the last office visit, his previously described symptoms show no change. Current symptoms are mild & are radiating to the groin and front of legs stopping at knee He has been taking Valium 5 mg as a muscle relaxer with some relief (rarely).  He continues to use ice and compression prn. He has been HEP with no trouble, 5-6 days weekly.   04/09/2018: Compared to the last office visit on 04/02/18, his previously described back and B hip pain (L>R) symptoms show no change.  He states that today is good day but reports more hip pain prior to that. Current symptoms are mild & are radiating to B hips and proximal ant thigh (L>R) He has been taking Valium 5 mg as a muscle relaxer with some relief (rarely).  He continues to use ice and compression prn. He has been HEP with no trouble, 5-6 days weekly.    REVIEW OF SYSTEMS: Denies night time disturbances. Denies fevers, chills, or night sweats. Denies unexplained weight loss. Denies personal history of cancer. Denies changes in bowel or bladder habits. Denies recent unreported falls. Denies new or worsening dyspnea or wheezing. Denies headaches or dizziness.  Denies numbness, tingling or weakness  In the extremities.  Denies dizziness or presyncopal episodes Denies lower extremity edema    HISTORY:  Prior history reviewed and updated per electronic medical record.  Social History   Occupational History  . Not on file  Tobacco Use  . Smoking status: Former Games developer  . Smokeless tobacco: Never Used  Substance and Sexual Activity  . Alcohol use: No    Comment: ocassional  . Drug use: No  . 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 X-Rays Lumbar Spine - 04/20/17 normal, no significant changes Responds well to dry needling and osteopathic manipulation   OBJECTIVE:  VS:  HT:6' 2.5" (189.2 cm)   WT:213 lb 3.2 oz (96.7 kg)  BMI:27.02    BP:(!) 126/92  HR:85bpm  TEMP: ( )  RESP:95 %   PHYSICAL EXAM: CONSTITUTIONAL: Well-developed, Well-nourished and In no acute distress PSYCHIATRIC: Alert & appropriately interactive. and Not depressed or anxious appearing. RESPIRATORY: No increased work of breathing and Trachea Midline EYES: Pupils are equal., EOM intact without nystagmus. and No scleral icterus.  VASCULAR EXAM: Warm and well perfused NEURO: unremarkable  MSK Exam: Back and neck and lower extremities  Well aligned, no significant deformity. No overlying skin changes. Generalized tightness with marked anterior chain dominance No focal bony tenderness   RANGE OF MOTION & STRENGTH  Tight hamstrings bilaterally with overall good mobility other than markedly tight hip flexors.   SPECIALITY TESTING:  Normal internal/external rotation of bilateral hips Negative straight leg raise bilaterally Negative Spurling's compression test bilaterally, normal brachial plexus squeeze      ASSESSMENT   1. Degeneration of lumbar intervertebral disc   2. Chronic bilateral low back pain with bilateral sciatica   3. Somatic dysfunction of thoracic region   4. Somatic dysfunction of lumbar region   5. Somatic dysfunction of pelvis region   6. Somatic dysfunction of sacral region   7. Somatic dysfunction of rib cage region   8. Left hip pain     PLAN:  Pertinent additional documentation may be included in corresponding  procedure notes, imaging studies, problem based documentation and patient instructions.  Procedures:  . Osteopathic manipulation was performed today based on physical exam findings.  Please see procedure note for further information including Osteopathic Exam findings  Medications:  No orders of the defined types were placed in this encounter.   Discussion/Instructions: . Functional low back pain with markedly tight hip flexors the underlying root cause of his issues. Continue previously prescribed home exercise program.  . Continues to do well with working on hip flexor stretching and mobility.  Responding well to osteopathic manipulation . Discussed red flag symptoms that warrant earlier emergent evaluation and patient voices understanding. . Activity modifications and the importance of avoiding exacerbating activities (limiting pain to no more than a 4 / 10 during or following activity) recommended and discussed.  Follow-up:  . Return in about 2 weeks (around 04/23/2018) for consideration of repeat Osteopathic Manipulation.  . If any lack of improvement consider: . further  diagnostic evaluation with MRI of the lumbar spine and/or consideration of diagnostic and therapeutic intra-articular left hip injection versus MR arthrogram of the left hip localizing pain     CMA/ATC served as scribe during this visit. History, Physical, and Plan performed by medical provider. Documentation and orders reviewed and attested to.      Andrena Mews, DO    [place] [facility] Physician

## 2018-04-09 NOTE — Progress Notes (Signed)
PROCEDURE NOTE : OSTEOPATHIC MANIPULATION The decision today to treat with Osteopathic Manipulative Therapy (OMT) was based on physical exam findings. Verbal consent was obtained following a discussion with the patient regarding the of risks, benefits and potential side effects, including an acute pain flare,post manipulation soreness and need for repeat treatments.     Contraindications to OMT: NONE  Manipulation was performed as below: Regions treated: Thoracic spine, Ribs, Lumbar spine, Pelvis and Sacrum OMT Techniques Used: HVLA, muscle energy, myofascial release, soft tissue and facilitated positional release  The patient tolerated the treatment well and reported Improved symptoms following treatment today. Patient was given medications, exercises, stretches and lifestyle modifications per AVS and verbally.   OSTEOPATHIC/STRUCTURAL EXAM:   T3 FRS right (Flexed, Rotated & Sidebent) T8 -10 Neutral, Rotated LEFT, Sidebent RIGHT Rib 6 Right  Posterior L4 FRS right (Flexed, Rotated & Sidebent) Right psoas spasm Right anterior innonimate L on L sacral torsion

## 2018-04-09 NOTE — Telephone Encounter (Signed)
New Message  Pt verbalized he is needing referral for [facility] due to he is only available on Friday and Leotis ShamesLauren is not here on Friday's. He's been before but after 30 days he'll need a referral.

## 2018-04-09 NOTE — Patient Instructions (Addendum)
hypervolt to look at online We are referring you back to physical therapy for dry needling to the left hip flexors/quadricep muscles

## 2018-04-09 NOTE — Telephone Encounter (Signed)
Referral has been placed. 

## 2018-04-10 ENCOUNTER — Encounter: Payer: Self-pay | Admitting: Sports Medicine

## 2018-04-13 ENCOUNTER — Encounter: Payer: Self-pay | Admitting: Sports Medicine

## 2018-04-23 ENCOUNTER — Encounter: Payer: Self-pay | Admitting: Sports Medicine

## 2018-04-23 ENCOUNTER — Ambulatory Visit (INDEPENDENT_AMBULATORY_CARE_PROVIDER_SITE_OTHER): Payer: BLUE CROSS/BLUE SHIELD | Admitting: Sports Medicine

## 2018-04-23 VITALS — BP 126/88 | HR 80 | Ht 74.5 in | Wt 212.8 lb

## 2018-04-23 DIAGNOSIS — M9905 Segmental and somatic dysfunction of pelvic region: Secondary | ICD-10-CM

## 2018-04-23 DIAGNOSIS — M5136 Other intervertebral disc degeneration, lumbar region: Secondary | ICD-10-CM

## 2018-04-23 DIAGNOSIS — M9908 Segmental and somatic dysfunction of rib cage: Secondary | ICD-10-CM

## 2018-04-23 DIAGNOSIS — M9903 Segmental and somatic dysfunction of lumbar region: Secondary | ICD-10-CM

## 2018-04-23 DIAGNOSIS — M9901 Segmental and somatic dysfunction of cervical region: Secondary | ICD-10-CM | POA: Diagnosis not present

## 2018-04-23 DIAGNOSIS — M9902 Segmental and somatic dysfunction of thoracic region: Secondary | ICD-10-CM | POA: Diagnosis not present

## 2018-04-23 DIAGNOSIS — M51369 Other intervertebral disc degeneration, lumbar region without mention of lumbar back pain or lower extremity pain: Secondary | ICD-10-CM

## 2018-04-23 NOTE — Progress Notes (Signed)
Jonathan Strong. Jonathan Strong [phone]  Phinehas Leesman - 32 y.o. male MRN [id number]  Date of birth: Aug 19, 1985  Visit Date: 04/23/2018  PCP: [name], MD   Referred by: [name], MD  Scribe(s) for today's visit: [name], CMA  SUBJECTIVE:  Jonathan Strong "[name]" is here for Follow-up (LBP)   08/10/2017: Compared to the last office visit, his previously described symptoms are improving. He was at a conference recently and had to sit for 4 days which has caused some pain in lower back.  Current symptoms are moderate & are radiating to the gluteal region.  He has not been seen by Dr. Lendon Collar. He did benefit from dry needling in the past, he will be having this done later today.   12/18/2017: Compared to the last office visit, his previously described symptoms are worsening. Sx have been getting worse over the past week. Yesterday he was picking up his child and that caused the pain to flare up even more.  Current symptoms are moderate & are radiating to bilateral hips, glutes, and into thighs.  He has been using ice and compression. He tried taking a valium and got little relief. He has taken muscle relaxer in the past with some relief. He has been taking tylenol with some relief.   03/12/2018: Compared to the last office visit, his previously described symptoms are worsening. Sx started to flare up yesterday.  Current symptoms are mild to moderate & are radiating to B hips but not beyond the hips.  He has been wearing a pressure waist band. He has taken IBU with no relief. He took an diazepam with some relief.   03/30/2018 Compared to the last office visit, his previously described symptoms are worsening Pain low back b/l leg pain down front of thigh stopping at knee.  Current symptoms are mild & are radiating to the back, the groin and front of legs stopping at knee  He has been using ice,  compression brace for back with little improvement. Felt like brace increased pain.  He has been taking the valium that is prescribed with little relief.  No new imaging from last visit.   04/02/2018: Compared to the last office visit, his previously described symptoms show no change. Current symptoms are mild & are radiating to the groin and front of legs stopping at knee He has been taking Valium 5 mg as a muscle relaxer with some relief (rarely).  He continues to use ice and compression prn. He has been HEP with no trouble, 5-6 days weekly.   04/09/2018: Compared to the last office visit on 04/02/18, his previously described back and B hip pain (L>R) symptoms show no change.  He states that today is good day but reports more hip pain prior to that. Current symptoms are mild & are radiating to B hips and proximal ant thigh (L>R) He has been taking Valium 5 mg as a muscle relaxer with some relief (rarely).  He continues to use ice and compression prn. He has been HEP with no trouble, 5-6 days weekly.    04/23/2018: Compared to the last office visit, his previously described symptoms show no change. He does note tightness on the lateral aspect of the left hip.  Current symptoms are mild & are radiating to the lateral L hip. He has pain in the anterior thigh if he sits too long.  No change to  medication regimen or other modalities. He continues to do HEP 5-6 days weekly with no difficulty. He stretches daily.    REVIEW OF SYSTEMS: Denies night time disturbances. Denies fevers, chills, or night sweats. Denies unexplained weight loss. Denies personal history of cancer. Denies changes in bowel or bladder habits. Denies recent unreported falls. Denies new or worsening dyspnea or wheezing. Denies headaches or dizziness.  Denies numbness, tingling or weakness  In the extremities.  Denies dizziness or presyncopal episodes Denies lower extremity edema    HISTORY:  Prior history reviewed and  updated per electronic medical record.  Social History   Occupational History  . Not on file  Tobacco Use  . Smoking status: Former Games developer  . Smokeless tobacco: Never Used  Substance and Sexual Activity  . Alcohol use: No    Comment: ocassional  . Drug use: No  . 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 X-Rays Lumbar Spine - 04/20/17 normal, no significant changes Responds well to dry needling and osteopathic manipulation   OBJECTIVE:  VS:  HT:    WT:   BMI:     BP:   HR: bpm  TEMP: ( )  RESP:    PHYSICAL EXAM: CONSTITUTIONAL: Well-developed, Well-nourished and In no acute distress PSYCHIATRIC: Alert & appropriately interactive. and Not depressed or anxious appearing. RESPIRATORY: No increased work of breathing and Trachea Midline EYES: Pupils are equal., EOM intact without nystagmus. and No scleral icterus.  VASCULAR EXAM: Warm and well perfused NEURO: unremarkable  MSK Exam: Back and neck and lower extremities  Well aligned, no significant deformity. No overlying skin changes. Generalized tightness with marked anterior chain dominance No focal bony tenderness   RANGE OF MOTION & STRENGTH  Tight hamstrings bilaterally with overall good mobility other than markedly tight hip flexors.   SPECIALITY TESTING:  Negative Spurling's compression test bilaterally, normal brachial plexus squeeze       ASSESSMENT   1. Degeneration of lumbar intervertebral disc   2. Somatic dysfunction of cervical region   3. Somatic dysfunction of thoracic region   4. Somatic dysfunction of lumbar  region   5. Somatic dysfunction of rib cage region   6. Somatic dysfunction of pelvis region     PLAN:  Pertinent additional documentation may be included in corresponding procedure notes, imaging studies, problem based documentation and patient instructions.  Procedures:  . Osteopathic manipulation was performed today based on physical exam findings.  Please see procedure note for further information including Osteopathic Exam findings  Medications:  No orders of the defined types were placed in this encounter.  Discussion/Instructions: No problem-specific Assessment & Plan notes found for this encounter.  . Functional axial pain.  Responseding well to OMT and HEP however still getting fairly significant flairup.  Recommend comprehensive HEP and/or return to PT . Discussed red flag symptoms that warrant earlier emergent evaluation and patient voices understanding. . Activity modifications and the importance of avoiding exacerbating activities (limiting pain to no more than a 4 / 10  during or following activity) recommended and discussed.  Follow-up:  . No follow-ups on file.  . If any lack of improvement consider: further diagnostic evaluation with MRI lumbar/cervical spine and referral to Physical Therapy . At follow up will plan to consider: repeat osteopathic manipulation     CMA/ATC served as scribe during this visit. History, Physical, and Plan performed by medical provider. Documentation and orders reviewed and attested to.      Andrena Mews, DO     Sports Medicine Physician

## 2018-05-07 ENCOUNTER — Encounter: Payer: Self-pay | Admitting: Sports Medicine

## 2018-05-07 ENCOUNTER — Ambulatory Visit (INDEPENDENT_AMBULATORY_CARE_PROVIDER_SITE_OTHER): Payer: BLUE CROSS/BLUE SHIELD | Admitting: Sports Medicine

## 2018-05-07 ENCOUNTER — Ambulatory Visit: Payer: BLUE CROSS/BLUE SHIELD | Admitting: Family Medicine

## 2018-05-07 VITALS — BP 124/82 | HR 79 | Ht 74.5 in | Wt 211.2 lb

## 2018-05-07 DIAGNOSIS — M24552 Contracture, left hip: Secondary | ICD-10-CM

## 2018-05-07 DIAGNOSIS — M5441 Lumbago with sciatica, right side: Secondary | ICD-10-CM

## 2018-05-07 DIAGNOSIS — M9901 Segmental and somatic dysfunction of cervical region: Secondary | ICD-10-CM | POA: Diagnosis not present

## 2018-05-07 DIAGNOSIS — M9908 Segmental and somatic dysfunction of rib cage: Secondary | ICD-10-CM

## 2018-05-07 DIAGNOSIS — M9903 Segmental and somatic dysfunction of lumbar region: Secondary | ICD-10-CM | POA: Diagnosis not present

## 2018-05-07 DIAGNOSIS — M9905 Segmental and somatic dysfunction of pelvic region: Secondary | ICD-10-CM

## 2018-05-07 DIAGNOSIS — M9902 Segmental and somatic dysfunction of thoracic region: Secondary | ICD-10-CM | POA: Diagnosis not present

## 2018-05-07 DIAGNOSIS — M5442 Lumbago with sciatica, left side: Secondary | ICD-10-CM

## 2018-05-07 DIAGNOSIS — M25552 Pain in left hip: Secondary | ICD-10-CM

## 2018-05-07 DIAGNOSIS — G8929 Other chronic pain: Secondary | ICD-10-CM

## 2018-05-07 DIAGNOSIS — M5136 Other intervertebral disc degeneration, lumbar region: Secondary | ICD-10-CM

## 2018-05-07 NOTE — Progress Notes (Signed)
PROCEDURE NOTE : OSTEOPATHIC MANIPULATION The decision today to treat with Osteopathic Manipulative Therapy (OMT) was based on physical exam findings. Verbal consent was obtained following a discussion with the patient regarding the of risks, benefits and potential side effects, including an acute pain flare,post manipulation soreness and need for repeat treatments.     Contraindications to OMT: NONE  Manipulation was performed as below: Regions Treated OMT Techniques Used  Cervical spine Thoracic spine Ribs Lumbar spine Pelvis Sacrum HVLA muscle energy myofascial release soft tissue   The patient tolerated the treatment well and reported Improved symptoms following treatment today. Patient was given medications, exercises, stretches and lifestyle modifications per AVS and verbally.   OSTEOPATHIC/STRUCTURAL EXAM:   C4 FRS right (Flexed, Rotated & Sidebent) T3 FRS right (Flexed, Rotated & Sidebent) T8 -10 Neutral, Rotated LEFT, Sidebent RIGHT Rib 6 Right  Posterior L4 FRS right (Flexed, Rotated & Sidebent) Right psoas spasm Right anterior innonimate L on L sacral torsion

## 2018-05-07 NOTE — Progress Notes (Signed)
Jonathan Strong. Jonathan [facility] at [facility] [phone]  Jonathan Strong - 32 y.o. male MRN [id number]  Date of birth: [birth year]  Visit Date: 05/07/2018  PCP: [name], MD   Referred by: [name], MD  Scribe(s) for today's visit: [name], CMA  SUBJECTIVE:  Jonathan Strong "Jonathan Strong" is here for Follow-up (LBP)   08/10/2017: Compared to the last office visit, his previously described symptoms are improving. He was at a conference recently and had to sit for 4 days which has caused some pain in lower back.  Current symptoms are moderate & are radiating to the gluteal region.  He has not been seen by Dr. Lendon Collar. He did benefit from dry needling in the past, he will be having this done later today.   12/18/2017: Compared to the last office visit, his previously described symptoms are worsening. Sx have been getting worse over the past week. Yesterday he was picking up his child and that caused the pain to flare up even more.  Current symptoms are moderate & are radiating to bilateral hips, glutes, and into thighs.  He has been using ice and compression. He tried taking a valium and got little relief. He has taken muscle relaxer in the past with some relief. He has been taking tylenol with some relief.   03/12/2018: Compared to the last office visit, his previously described symptoms are worsening. Sx started to flare up yesterday.  Current symptoms are mild to moderate & are radiating to B hips but not beyond the hips.  He has been wearing a pressure waist band. He has taken IBU with no relief. He took an diazepam with some relief.   03/30/2018 Compared to the last office visit, his previously described symptoms are worsening Pain low back b/l leg pain down front of thigh stopping at knee.  Current symptoms are mild & are radiating to the back, the groin and front of legs stopping at knee  He has been using ice,  compression brace for back with little improvement. Felt like brace increased pain.  He has been taking the valium that is prescribed with little relief.  No new imaging from last visit.   04/02/2018: Compared to the last office visit, his previously described symptoms show no change. Current symptoms are mild & are radiating to the groin and front of legs stopping at knee He has been taking Valium 5 mg as a muscle relaxer with some relief (rarely).  He continues to use ice and compression prn. He has been HEP with no trouble, 5-6 days weekly.   04/09/2018: Compared to the last office visit on 04/02/18, his previously described back and B hip pain (L>R) symptoms show no change.  He states that today is good day but reports more hip pain prior to that. Current symptoms are mild & are radiating to B hips and proximal ant thigh (L>R) He has been taking Valium 5 mg as a muscle relaxer with some relief (rarely).  He continues to use ice and compression prn. He has been HEP with no trouble, 5-6 days weekly.    04/23/2018: Compared to the last office visit, his previously described symptoms show no change. He does note tightness on the lateral aspect of the left hip.  Current symptoms are mild & are radiating to the lateral L hip. He has pain in the anterior thigh if he sits too long.  No change to  medication regimen or other modalities. He continues to do HEP 5-6 days weekly with no difficulty. He stretches daily.   05/07/2018: Compared to the last office visit, his previously described symptoms are worsening, he reports increased tightness in both legs/hamstrings. He continues to have tightness in lower back, hips, and groin. Pain is constant. Sx are worse the day after before more active. The left hip is tender to palpation, feels bruised. Aches and pains dont bother him at night but makes it hard to get out of bed.  Current symptoms are moderate & are radiating to both hips, legs, and groin.  He  reports o change to medication regimen or other modalities. He continues to do HEP 5-6 days weekly with no difficulty. He stretches daily.    REVIEW OF SYSTEMS: Denies night time disturbances. Denies fevers, chills, or night sweats. Denies unexplained weight loss. Denies personal history of cancer. Denies changes in bowel or bladder habits. Denies recent unreported falls. Denies new or worsening dyspnea or wheezing. Denies headaches or dizziness.  Denies numbness, tingling or weakness  In the extremities.  Denies dizziness or presyncopal episodes Denies lower extremity edema    HISTORY:  Prior history reviewed and updated per electronic medical record.  Social History   Occupational History  . Not on file  Tobacco Use  . Smoking status: Former Games developer  . Smokeless tobacco: Never Used  Substance and Sexual Activity  . Alcohol use: No    Comment: ocassional  . Drug use: No  . 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 X-Rays Lumbar Spine - 04/20/17 normal, no significant changes Responds well to dry needling and osteopathic manipulation   OBJECTIVE:  VS:  HT:6' 2.5" (189.2 cm)   WT:211 lb 3.2 oz (95.8 kg)  BMI:26.76    BP:124/82  HR:79bpm  TEMP: ( )  RESP:96 %   PHYSICAL EXAM: CONSTITUTIONAL: Well-developed, Well-nourished and In no acute distress PSYCHIATRIC: Alert & appropriately interactive. and Not depressed or anxious appearing. RESPIRATORY: No increased work of breathing and Trachea Midline EYES: Pupils are equal., EOM intact without nystagmus. and No scleral icterus.  VASCULAR  EXAM: Warm and well perfused NEURO: unremarkable  MSK Exam: Back and neck and lower extremities  Well aligned, no significant deformity. No overlying skin changes. Generalized tightness with marked anterior chain dominance No focal bony tenderness   RANGE OF MOTION & STRENGTH  Tight hamstrings bilaterally with overall good mobility other than markedly tight hip flexors.   SPECIALITY TESTING:  Negative Spurling's compression test bilaterally, normal brachial plexus squeeze       ASSESSMENT   1. Degeneration of lumbar intervertebral disc   2. Somatic dysfunction of cervical region   3. Somatic dysfunction of thoracic region   4. Somatic dysfunction of lumbar region   5. Somatic dysfunction of rib cage region   6. Somatic dysfunction of pelvis region   7. Chronic bilateral low back pain with bilateral sciatica   8. Left hip pain   9. Left hip flexor  tightness     PLAN:  Pertinent additional documentation may be included in corresponding procedure notes, imaging studies, problem based documentation and patient instructions.  Procedures:  . Osteopathic manipulation was performed today based on physical exam findings.  Please see procedure note for further information including Osteopathic Exam findings  Medications:  No orders of the defined types were placed in this encounter.   Discussion/Instructions: . Functional low back pain with markedly tight hip flexors the underlying root cause of his issues. Continue previously prescribed home exercise program.  . Continues to do well with working on hip flexor stretching and mobility.  Responding well to osteopathic manipulation . Discussed red flag symptoms that warrant earlier emergent evaluation and patient voices understanding. . Activity modifications and the importance of avoiding exacerbating activities (limiting pain to no more than a 4 / 10 during or following activity) recommended and discussed.  Follow-up:  . Return in  about 2 weeks (around 05/21/2018) for consideration of repeat Osteopathic Manipulation.  . If any worsening consider repeat dry needling     CMA/ATC served as scribe during this visit. History, Physical, and Plan performed by medical provider. Documentation and orders reviewed and attested to.      Andrena Mews, DO    [facility]

## 2018-05-21 ENCOUNTER — Ambulatory Visit: Payer: BLUE CROSS/BLUE SHIELD | Admitting: Sports Medicine

## 2018-06-03 NOTE — Progress Notes (Signed)
Jonathan Strong. Jonathan Strong [facility] at [facility] [phone]  Jonathan Strong - 32 y.o. male MRN [id number]  Date of birth: [birth year]  Visit Date: 06/04/2018  PCP: [name], MD   Referred by: [name], MD  Scribe(s) for today's visit: [name], CMA  SUBJECTIVE:  Jonathan Strong "[name]" is here for Follow-up (LBP, L hip pain/tightness, B hamstring tightness)   08/10/2017: Compared to the last office visit, his previously described symptoms are improving. He was at a conference recently and had to sit for 4 days which has caused some pain in lower back.  Current symptoms are moderate & are radiating to the gluteal region.  He has not been seen by Dr. Lendon Collar. He did benefit from dry needling in the past, he will be having this done later today.   12/18/2017: Compared to the last office visit, his previously described symptoms are worsening. Sx have been getting worse over the past week. Yesterday he was picking up his child and that caused the pain to flare up even more.  Current symptoms are moderate & are radiating to bilateral hips, glutes, and into thighs.  He has been using ice and compression. He tried taking a valium and got little relief. He has taken muscle relaxer in the past with some relief. He has been taking tylenol with some relief.   03/12/2018: Compared to the last office visit, his previously described symptoms are worsening. Sx started to flare up yesterday.  Current symptoms are mild to moderate & are radiating to B hips but not beyond the hips.  He has been wearing a pressure waist band. He has taken IBU with no relief. He took an diazepam with some relief.   03/30/2018 Compared to the last office visit, his previously described symptoms are worsening Pain low back b/l leg pain down front of thigh stopping at knee.  Current symptoms are mild & are radiating to the back, the groin and front of legs  stopping at knee  He has been using ice, compression brace for back with little improvement. Felt like brace increased pain.  He has been taking the valium that is prescribed with little relief.  No new imaging from last visit.   04/02/2018: Compared to the last office visit, his previously described symptoms show no change. Current symptoms are mild & are radiating to the groin and front of legs stopping at knee He has been taking Valium 5 mg as a muscle relaxer with some relief (rarely).  He continues to use ice and compression prn. He has been HEP with no trouble, 5-6 days weekly.   04/09/2018: Compared to the last office visit on 04/02/18, his previously described back and B hip pain (L>R) symptoms show no change.  He states that today is good day but reports more hip pain prior to that. Current symptoms are mild & are radiating to B hips and proximal ant thigh (L>R) He has been taking Valium 5 mg as a muscle relaxer with some relief (rarely).  He continues to use ice and compression prn. He has been HEP with no trouble, 5-6 days weekly.    04/23/2018: Compared to the last office visit, his previously described symptoms show no change. He does note tightness on the lateral aspect of the left hip.  Current symptoms are mild & are radiating to the lateral L hip. He has pain in the anterior thigh if he sits  too long.  No change to medication regimen or other modalities. He continues to do HEP 5-6 days weekly with no difficulty. He stretches daily.   05/07/2018: Compared to the last office visit, his previously described symptoms are worsening, he reports increased tightness in both legs/hamstrings. He continues to have tightness in lower back, hips, and groin. Pain is constant. Sx are worse the day after before more active. The left hip is tender to palpation, feels bruised. Aches and pains dont bother him at night but makes it hard to get out of bed.  Current symptoms are moderate & are radiating  to both hips, legs, and groin.  He reports o change to medication regimen or other modalities. He continues to do HEP 5-6 days weekly with no difficulty. He stretches daily.   06/04/2018: Compared to the last office visit, his previously described symptoms are worsening. He cent over doing laundry last night and it felt like someone poked him in his back with a knight, more mid back. He has noticed a burning sensation in his mis back that radiate across the L side of the ribs. He notes that the skin is tender to touch. He has tried using Renew lotion. He has also noticed some cramping in his hamstrings which is worse when sitting for prolonged period of time.  Current symptoms are mild & are radiating to the B LE and mid back.  He has been taking IBU with some relief. He takes Valium prn as muscle relaxer, uses sparingly at bedtime.    REVIEW OF SYSTEMS: Denies night time disturbances. Denies fevers, chills, or night sweats. Denies unexplained weight loss. Denies personal history of cancer. Denies changes in bowel or bladder habits. Denies recent unreported falls. Denies new or worsening dyspnea or wheezing. Denies headaches or dizziness.  Denies numbness, tingling or weakness in the extremities.  Denies dizziness or presyncopal episodes Denies lower extremity edema    HISTORY:  Prior history reviewed and updated per electronic medical record.  Social History   Occupational History  . Not on file  Tobacco Use  . Smoking status: Former Games developer  . Smokeless tobacco: Never Used  Substance and Sexual Activity  . Alcohol use: No    Comment: ocassional  . Drug use: No  . 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 X-Rays Lumbar Spine - 04/20/17 normal, no significant changes Responds well to dry needling and osteopathic manipulation   OBJECTIVE:  VS:  HT:6' 2.5" (189.2 cm)   WT:223 lb (101.2 kg)  BMI:28.26    BP:124/84  HR:74bpm  TEMP: ( )  RESP:97 %   PHYSICAL EXAM: CONSTITUTIONAL: Well-developed, Well-nourished and In no acute distress PSYCHIATRIC: Alert & appropriately interactive. and Not depressed or anxious appearing. RESPIRATORY: No increased work of breathing and Trachea Midline EYES: Pupils are equal., EOM intact without nystagmus. and No scleral icterus.  VASCULAR EXAM: Warm and well perfused NEURO: unremarkable  MSK Exam: Back and neck and lower extremities  Well aligned, no significant deformity.  No overlying skin changes. Generalized tightness with marked anterior chain dominance No focal bony tenderness   RANGE OF MOTION & STRENGTH  Tight hamstrings bilaterally with overall good mobility.  This is significantly improved and in the past.  With improved tissue texture changes.   SPECIALITY TESTING:  Negative Spurling's compression test bilaterally, normal brachial plexus squeeze       ASSESSMENT   1. Degeneration of lumbar intervertebral disc   2. Somatic dysfunction of cervical region   3. Somatic dysfunction of thoracic region   4. Somatic dysfunction of lumbar region   5. Somatic dysfunction of rib cage region   6. Somatic dysfunction of pelvis region   7. Left hip flexor tightness   8. Stress   9. Chronic bilateral low back pain without sciatica     PLAN:  Pertinent additional documentation may be included in corresponding procedure notes, imaging studies, problem based documentation and patient instructions.  Procedures:  . Osteopathic manipulation was performed today based on physical exam findings.  Please see procedure note for further  information including Osteopathic Exam findings  Medications:  Meds ordered this encounter  Medications  . amitriptyline (ELAVIL) 25 MG tablet    Sig: Take 1 tablet (25 mg total) by mouth at bedtime.    Dispense:  30 tablet    Refill:  3    Discussion/Instructions: .  Continue previously prescribed home exercise program.  . Continues to do well with working on hip flexor stretching and mobility.  Responding well to osteopathic manipulation . Discussed red flag symptoms that warrant earlier emergent evaluation and patient voices understanding. . Activity modifications and the importance of avoiding exacerbating activities (limiting pain to no more than a 4 / 10 during or following activity) recommended and discussed.  Follow-up:  . Return in about 2 weeks (around 06/18/2018) for consideration of repeat Osteopathic Manipulation.  . If any worsening consider repeat dry needling     CMA/ATC served as scribe during this visit. History, Physical, and Plan performed by medical provider. Documentation and orders reviewed and attested to.      Andrena Mews, DO    [facility] Physician

## 2018-06-04 ENCOUNTER — Ambulatory Visit (INDEPENDENT_AMBULATORY_CARE_PROVIDER_SITE_OTHER): Payer: BLUE CROSS/BLUE SHIELD | Admitting: Sports Medicine

## 2018-06-04 ENCOUNTER — Encounter: Payer: Self-pay | Admitting: Sports Medicine

## 2018-06-04 VITALS — BP 124/84 | HR 74 | Ht 74.5 in | Wt 223.0 lb

## 2018-06-04 DIAGNOSIS — M545 Low back pain: Secondary | ICD-10-CM

## 2018-06-04 DIAGNOSIS — M9905 Segmental and somatic dysfunction of pelvic region: Secondary | ICD-10-CM

## 2018-06-04 DIAGNOSIS — M5136 Other intervertebral disc degeneration, lumbar region: Secondary | ICD-10-CM

## 2018-06-04 DIAGNOSIS — M9908 Segmental and somatic dysfunction of rib cage: Secondary | ICD-10-CM

## 2018-06-04 DIAGNOSIS — M9903 Segmental and somatic dysfunction of lumbar region: Secondary | ICD-10-CM

## 2018-06-04 DIAGNOSIS — F439 Reaction to severe stress, unspecified: Secondary | ICD-10-CM

## 2018-06-04 DIAGNOSIS — M9901 Segmental and somatic dysfunction of cervical region: Secondary | ICD-10-CM

## 2018-06-04 DIAGNOSIS — M24552 Contracture, left hip: Secondary | ICD-10-CM

## 2018-06-04 DIAGNOSIS — M9902 Segmental and somatic dysfunction of thoracic region: Secondary | ICD-10-CM | POA: Diagnosis not present

## 2018-06-04 DIAGNOSIS — G8929 Other chronic pain: Secondary | ICD-10-CM

## 2018-06-04 MED ORDER — AMITRIPTYLINE HCL 25 MG PO TABS: 25.0000 mg | ORAL_TABLET | Freq: Every day | ORAL | 3 refills | Status: DC

## 2018-06-04 NOTE — Assessment & Plan Note (Signed)
Long-standing pain that is been intermittent in nature.  He does notice that it likely correlates with his stress levels again discussed the possible benefit of adding amitriptyline.  Did discuss possible side effects with him in great detail roundness and will have him start with half of a tablet at night for the next week then increase to 1 tablet nightly.  Follow-up in 2 weeks for consideration of repeat OMT.

## 2018-06-04 NOTE — Progress Notes (Signed)
PROCEDURE NOTE : OSTEOPATHIC MANIPULATION The decision today to treat with Osteopathic Manipulative Therapy (OMT) was based on physical exam findings. Verbal consent was obtained following a discussion with the patient regarding the of risks, benefits and potential side effects, including an acute pain flare,post manipulation soreness and need for repeat treatments.     Contraindications to OMT: NONE  Manipulation was performed as below: Regions Treated OMT Techniques Used  Cervical spine Thoracic spine Ribs Lumbar spine Pelvis Sacrum HVLA muscle energy myofascial release soft tissue   The patient tolerated the treatment well and reported Improved symptoms following treatment today. Patient was given medications, exercises, stretches and lifestyle modifications per AVS and verbally.   OSTEOPATHIC/STRUCTURAL EXAM:   T3 FRS right (Flexed, Rotated & Sidebent) T8 -9 Neutral, Rotated LEFT, Sidebent RIGHT Rib 6 Right  Inhalation dysfunction (depressed/exhaled) L4 FRS right (Flexed, Rotated & Sidebent) Right psoas spasm Right anterior innonimate L on L sacral torsion

## 2018-06-06 ENCOUNTER — Encounter: Payer: Self-pay | Admitting: Sports Medicine

## 2018-06-06 NOTE — Progress Notes (Signed)
PROCEDURE NOTE : OSTEOPATHIC MANIPULATION The decision today to treat with Osteopathic Manipulative Therapy (OMT) was based on physical exam findings. Verbal consent was obtained following a discussion with the patient regarding the of risks, benefits and potential side effects, including an acute pain flare,post manipulation soreness and need for repeat treatments.     Contraindications to OMT: NONE  Manipulation was performed as below: Regions Treated OMT Techniques Used  Cervical spine Thoracic spine Ribs Lumbar spine Pelvis HVLA muscle energy myofascial release   The patient tolerated the treatment well and reported Improved symptoms following treatment today. Patient was given medications, exercises, stretches and lifestyle modifications per AVS and verbally.   OSTEOPATHIC/STRUCTURAL EXAM:   C5 FRS right, no HVLA per pt request T3 FRS right (Flexed, Rotated & Sidebent) T8 -10 Neutral, Rotated LEFT, Sidebent RIGHT Rib 6 Right  Posterior L4 FRS right (Flexed, Rotated & Sidebent) Right psoas spasm Right anterior innonimate

## 2018-06-11 ENCOUNTER — Ambulatory Visit (INDEPENDENT_AMBULATORY_CARE_PROVIDER_SITE_OTHER): Payer: BLUE CROSS/BLUE SHIELD | Admitting: Sports Medicine

## 2018-06-11 ENCOUNTER — Encounter: Payer: Self-pay | Admitting: Sports Medicine

## 2018-06-11 VITALS — BP 120/92 | HR 83 | Ht 74.5 in | Wt 215.4 lb

## 2018-06-11 DIAGNOSIS — G8929 Other chronic pain: Secondary | ICD-10-CM

## 2018-06-11 DIAGNOSIS — M24552 Contracture, left hip: Secondary | ICD-10-CM

## 2018-06-11 DIAGNOSIS — M5136 Other intervertebral disc degeneration, lumbar region: Secondary | ICD-10-CM

## 2018-06-11 DIAGNOSIS — M9902 Segmental and somatic dysfunction of thoracic region: Secondary | ICD-10-CM

## 2018-06-11 DIAGNOSIS — M9903 Segmental and somatic dysfunction of lumbar region: Secondary | ICD-10-CM | POA: Diagnosis not present

## 2018-06-11 DIAGNOSIS — M9905 Segmental and somatic dysfunction of pelvic region: Secondary | ICD-10-CM

## 2018-06-11 DIAGNOSIS — M545 Low back pain: Secondary | ICD-10-CM

## 2018-06-11 DIAGNOSIS — M9908 Segmental and somatic dysfunction of rib cage: Secondary | ICD-10-CM

## 2018-06-11 MED ORDER — PREDNISONE 50 MG PO TABS: 50.0000 mg | ORAL_TABLET | Freq: Every day | ORAL | 0 refills | Status: AC

## 2018-06-11 MED ORDER — DIAZEPAM 5 MG PO TABS: 5.0000 mg | ORAL_TABLET | Freq: Three times a day (TID) | ORAL | 0 refills | Status: DC | PRN

## 2018-06-11 NOTE — Progress Notes (Signed)
Jonathan Strong. Jonathan Strong [facility] at [facility] [phone]  Jonathan Strong - 32 y.o. male MRN [id number]  Date of birth: 03-02-1986  Visit Date: 06/11/2018  PCP: Jonathan Dark, MD   Referred by: Jonathan Dark, MD  Scribe(s) for today's visit: [name], CMA  SUBJECTIVE:  Jonathan Strong "Jonathan Strong" is here for Follow-up (LBP)   08/10/2017: Compared to the last office visit, his previously described symptoms are improving. He was at a conference recently and had to sit for 4 days which has caused some pain in lower back.  Current symptoms are moderate & are radiating to the gluteal region.  He has not been seen by Dr. Lendon Collar. He did benefit from dry needling in the past, he will be having this done later today.   12/18/2017: Compared to the last office visit, his previously described symptoms are worsening. Sx have been getting worse over the past week. Yesterday he was picking up his child and that caused the pain to flare up even more.  Current symptoms are moderate & are radiating to bilateral hips, glutes, and into thighs.  He has been using ice and compression. He tried taking a valium and got little relief. He has taken muscle relaxer in the past with some relief. He has been taking tylenol with some relief.   03/12/2018: Compared to the last office visit, his previously described symptoms are worsening. Sx started to flare up yesterday.  Current symptoms are mild to moderate & are radiating to B hips but not beyond the hips.  He has been wearing a pressure waist band. He has taken IBU with no relief. He took an diazepam with some relief.   03/30/2018 Compared to the last office visit, his previously described symptoms are worsening Pain low back b/l leg pain down front of thigh stopping at knee.  Current symptoms are mild & are radiating to the back, the groin and front of legs stopping at knee  He has been using ice,  compression brace for back with little improvement. Felt like brace increased pain.  He has been taking the valium that is prescribed with little relief.  No new imaging from last visit.   04/02/2018: Compared to the last office visit, his previously described symptoms show no change. Current symptoms are mild & are radiating to the groin and front of legs stopping at knee He has been taking Valium 5 mg as a muscle relaxer with some relief (rarely).  He continues to use ice and compression prn. He has been HEP with no trouble, 5-6 days weekly.   04/09/2018: Compared to the last office visit on 04/02/18, his previously described back and B hip pain (L>R) symptoms show no change.  He states that today is good day but reports more hip pain prior to that. Current symptoms are mild & are radiating to B hips and proximal ant thigh (L>R) He has been taking Valium 5 mg as a muscle relaxer with some relief (rarely).  He continues to use ice and compression prn. He has been HEP with no trouble, 5-6 days weekly.    04/23/2018: Compared to the last office visit, his previously described symptoms show no change. He does note tightness on the lateral aspect of the left hip.  Current symptoms are mild & are radiating to the lateral L hip. He has pain in the anterior thigh if he sits too long.  No change to  medication regimen or other modalities. He continues to do HEP 5-6 days weekly with no difficulty. He stretches daily.   05/07/2018: Compared to the last office visit, his previously described symptoms are worsening, he reports increased tightness in both legs/hamstrings. He continues to have tightness in lower back, hips, and groin. Pain is constant. Sx are worse the day after before more active. The left hip is tender to palpation, feels bruised. Aches and pains dont bother him at night but makes it hard to get out of bed.  Current symptoms are moderate & are radiating to both hips, legs, and groin.  He  reports o change to medication regimen or other modalities. He continues to do HEP 5-6 days weekly with no difficulty. He stretches daily.   06/04/2018: Compared to the last office visit, his previously described symptoms are worsening. He cent over doing laundry last night and it felt like someone poked him in his back with a knight, more mid back. He has noticed a burning sensation in his mis back that radiate across the L side of the ribs. He notes that the skin is tender to touch. He has tried using Renew lotion. He has also noticed some cramping in his hamstrings which is worse when sitting for prolonged period of time.  Current symptoms are mild & are radiating to the B LE and mid back.  He has been taking IBU with some relief. He takes Valium prn as muscle relaxer, uses sparingly at bedtime.   06/11/2018: Compared to the last office visit, his previously described symptoms are worsening, sx started flaring up around 5:00 AM today. He cannot recall doing anything to cause the flare up. He has been waking up at night with "fits of coughing".  Current symptoms are moderate-severe (8/10) & are radiating to the R gluteal and hip region.  He has been taking IBU prn. He did take a Valium about 20 minutes ago.    REVIEW OF SYSTEMS: Reports night time disturbances. Denies fevers, chills, or night sweats. Denies unexplained weight loss. Denies personal history of cancer. Denies changes in bowel or bladder habits. Denies recent unreported falls. Denies new or worsening dyspnea or wheezing. Denies headaches or dizziness.  Denies numbness, tingling or weakness in the extremities.  Denies dizziness or presyncopal episodes Denies lower extremity edema    HISTORY:  Prior history reviewed and updated per electronic medical record.  Social History   Occupational History  . Not on file  Tobacco Use  . Smoking status: Former Games developer  . Smokeless tobacco: Never Used  Substance and Sexual  Activity  . Alcohol use: No    Comment: ocassional  . Drug use: No  . 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 X-Rays Lumbar Spine - 04/20/17 normal, no significant changes Responds well to dry needling and osteopathic manipulation   OBJECTIVE:  VS:  HT:6' 2.5" (189.2 cm)   WT:215 lb 6.4 oz (97.7 kg)  BMI:27.29    BP:(!) 120/92  HR:83bpm  TEMP: ( )  RESP:97 %   PHYSICAL EXAM: CONSTITUTIONAL: Well-developed, Well-nourished and In no acute distress PSYCHIATRIC: Alert & appropriately interactive. and Not depressed or anxious appearing. RESPIRATORY: No increased work of breathing and Trachea Midline EYES: Pupils are equal., EOM intact without nystagmus. and No scleral icterus.  VASCULAR EXAM: Warm and well perfused NEURO: unremarkable  MSK Exam: Back and neck and lower extremities  Well aligned, no significant deformity. No overlying skin changes. Generalized tightness with marked anterior chain dominance No focal bony tenderness   RANGE OF MOTION & STRENGTH  Tight hamstrings bilaterally with overall good mobility.  This is significantly improved and in the past.  With improved tissue texture changes.   SPECIALITY TESTING:  Negative Spurling's compression test bilaterally, normal brachial plexus squeeze       ASSESSMENT   1. Degeneration of lumbar intervertebral disc   2. Chronic bilateral low back pain without sciatica   3. Somatic dysfunction of thoracic region   4. Somatic dysfunction of lumbar region   5. Somatic dysfunction of rib cage region   6. Somatic dysfunction of pelvis  region   7. Left hip flexor tightness     PLAN:  Pertinent additional documentation may be included in corresponding procedure notes, imaging studies, problem based documentation and patient instructions.  Procedures:  . Osteopathic manipulation was performed today based on physical exam findings.  Please see procedure note for further information including Osteopathic Exam findings  Medications:  Meds ordered this encounter  Medications  . DISCONTD: diazepam (VALIUM) 5 MG tablet    Sig: Take 1 tablet (5 mg total) by mouth every 8 (eight) hours as needed for anxiety or muscle spasms.    Dispense:  30 tablet    Refill:  0  . diazepam (VALIUM) 5 MG tablet    Sig: Take 1 tablet (5 mg total) by mouth every 8 (eight) hours as needed for anxiety or muscle spasms.    Dispense:  30 tablet    Refill:  0  . predniSONE (DELTASONE) 50 MG tablet    Sig: Take 1 tablet (50 mg total) by mouth daily with breakfast for 5 days.    Dispense:  5 tablet    Refill:  0   Discussion/Instructions: Chronic back pain, Low Back Pain Cough is contributing to acute flareup of back pain.  Repeat osteopathic manipulation as well as steroid Dosepak provided today to help decrease the back pain as well as his cough is likely coming from acute viral illness.  His children have been sick.  He denies any significant fever, malaise or respiratory distress.    Repeat prescription provided today given the past several months and doing well with only use.  . Continue previously prescribed home exercise program.  . Discussed red flag symptoms that warrant earlier emergent evaluation and patient voices understanding. . Activity modifications and the importance of avoiding exacerbating activities (limiting pain to no more than a 4 / 10 during or following activity) recommended and discussed.  Follow-up:  . No follow-ups on file.  . At follow up will plan: to consider repeat osteopathic manipulation    CMA/ATC served as  scribe during this visit. History, Physical, and Plan performed by medical provider. Documentation and orders reviewed and attested to.      Andrena Mews, DO    [facility]

## 2018-06-12 ENCOUNTER — Encounter: Payer: Self-pay | Admitting: Sports Medicine

## 2018-06-12 NOTE — Progress Notes (Signed)
PROCEDURE NOTE : OSTEOPATHIC MANIPULATION The decision today to treat with Osteopathic Manipulative Therapy (OMT) was based on physical exam findings. Verbal consent was obtained following a discussion with the patient regarding the of risks, benefits and potential side effects, including an acute pain flare,post manipulation soreness and need for repeat treatments.     Contraindications to OMT: NONE  Manipulation was performed as below: Regions Treated OMT Techniques Used  1. Thoracic spine 2. Ribs 3. Lumbar spine 4. Pelvis 5. Sacrum . HVLA . muscle energy . myofascial release   The patient tolerated the treatment well and reported Improved symptoms following treatment today. Patient was given medications, exercises, stretches and lifestyle modifications per AVS and verbally.   OSTEOPATHIC/STRUCTURAL EXAM:   T3 FRS right (Flexed, Rotated & Sidebent) T8 -9 Neutral, Rotated LEFT, Sidebent RIGHT Rib 6 Right  Inhalation dysfunction (depressed/exhaled) L4 FRS right (Flexed, Rotated & Sidebent) Right psoas spasm Right anterior innonimate L on L sacral torsion

## 2018-06-12 NOTE — Assessment & Plan Note (Signed)
Cough is contributing to acute flareup of back pain.  Repeat osteopathic manipulation as well as steroid Dosepak provided today to help decrease the back pain as well as his cough is likely coming from acute viral illness.  His children have been sick.  He denies any significant fever, malaise or respiratory distress.    Repeat prescription provided today given the past several months and doing well with only use.

## 2018-06-18 ENCOUNTER — Ambulatory Visit: Payer: BLUE CROSS/BLUE SHIELD | Admitting: Family Medicine

## 2018-06-18 ENCOUNTER — Encounter: Payer: Self-pay | Admitting: Sports Medicine

## 2018-06-18 ENCOUNTER — Ambulatory Visit (INDEPENDENT_AMBULATORY_CARE_PROVIDER_SITE_OTHER): Payer: BLUE CROSS/BLUE SHIELD | Admitting: Sports Medicine

## 2018-06-18 VITALS — BP 118/86 | HR 84 | Ht 74.5 in | Wt 217.4 lb

## 2018-06-18 DIAGNOSIS — M545 Low back pain, unspecified: Secondary | ICD-10-CM

## 2018-06-18 DIAGNOSIS — M9906 Segmental and somatic dysfunction of lower extremity: Secondary | ICD-10-CM

## 2018-06-18 DIAGNOSIS — M9902 Segmental and somatic dysfunction of thoracic region: Secondary | ICD-10-CM

## 2018-06-18 DIAGNOSIS — G8929 Other chronic pain: Secondary | ICD-10-CM | POA: Diagnosis not present

## 2018-06-18 DIAGNOSIS — M5136 Other intervertebral disc degeneration, lumbar region: Secondary | ICD-10-CM | POA: Diagnosis not present

## 2018-06-18 DIAGNOSIS — M9903 Segmental and somatic dysfunction of lumbar region: Secondary | ICD-10-CM

## 2018-06-18 DIAGNOSIS — M24551 Contracture, right hip: Secondary | ICD-10-CM

## 2018-06-18 DIAGNOSIS — M9908 Segmental and somatic dysfunction of rib cage: Secondary | ICD-10-CM

## 2018-06-18 DIAGNOSIS — M9904 Segmental and somatic dysfunction of sacral region: Secondary | ICD-10-CM

## 2018-06-18 DIAGNOSIS — M9905 Segmental and somatic dysfunction of pelvic region: Secondary | ICD-10-CM

## 2018-06-18 NOTE — Progress Notes (Signed)
PROCEDURE NOTE : OSTEOPATHIC MANIPULATION The decision today to treat with Osteopathic Manipulative Therapy (OMT) was based on physical exam findings. Verbal consent was obtained following a discussion with the patient regarding the of risks, benefits and potential side effects, including an acute pain flare,post manipulation soreness and need for repeat treatments.     Contraindications to OMT: NONE  Manipulation was performed as below: Regions Treated OMT Techniques Used  1. Thoracic spine 2. Ribs 3. Lumbar spine 4. Pelvis 5. Sacrum . HVLA . muscle energy . myofascial release   The patient tolerated the treatment well and reported Improved symptoms following treatment today. Patient was given medications, exercises, stretches and lifestyle modifications per AVS and verbally.   OSTEOPATHIC/STRUCTURAL EXAM:   T3 FRS right (Flexed, Rotated & Sidebent) T8 -9 Neutral, Rotated LEFT, Sidebent RIGHT Rib 6 Right  Inhalation dysfunction (depressed/exhaled) L4 FRS right (Flexed, Rotated & Sidebent) Right psoas spasm Right anterior innonimate L on L sacral torsion  

## 2018-06-18 NOTE — Progress Notes (Signed)
Jonathan Strong. Jonathan Strong [id number]  Jonathan Strong - 32 y.o. male MRN [id number]  Date of birth: Apr 18, 1986  Visit Date: 06/18/2018  PCP: Jonathan Dark, MD   Referred by: Jonathan Dark, MD  Scribe(s) for today's visit: Jonathan Strong, LAT, ATC  SUBJECTIVE:  Jonathan Strong is here for Follow-up (LBP)   08/10/2017: Compared to the last office visit, his previously described symptoms are improving. He was at a conference recently and had to sit for 4 days which has caused some pain in lower back.  Current symptoms are moderate & are radiating to the gluteal region.  He has not been seen by Dr. Lendon Strong. He did benefit from dry needling in the past, he will be having this done later today.   12/18/2017: Compared to the last office visit, his previously described symptoms are worsening. Sx have been getting worse over the past week. Yesterday he was picking up his child and that caused the pain to flare up even more.  Current symptoms are moderate & are radiating to bilateral hips, glutes, and into thighs.  He has been using ice and compression. He tried taking a valium and got little relief. He has taken muscle relaxer in the past with some relief. He has been taking tylenol with some relief.   03/12/2018: Compared to the last office visit, his previously described symptoms are worsening. Sx started to flare up yesterday.  Current symptoms are mild to moderate & are radiating to B hips but not beyond the hips.  He has been wearing a pressure waist band. He has taken IBU with no relief. He took an diazepam with some relief.   03/30/2018 Compared to the last office visit, his previously described symptoms are worsening Pain low back b/l leg pain down front of thigh stopping at knee.  Current symptoms are mild & are radiating to the back, the groin and front of legs stopping at knee  He has been using ice, compression brace  for back with little improvement. Felt like brace increased pain.  He has been taking the valium that is prescribed with little relief.  No new imaging from last visit.   04/02/2018: Compared to the last office visit, his previously described symptoms show no change. Current symptoms are mild & are radiating to the groin and front of legs stopping at knee He has been taking Valium 5 mg as a muscle relaxer with some relief (rarely).  He continues to use ice and compression prn. He has been HEP with no trouble, 5-6 days weekly.   04/09/2018: Compared to the last office visit on 04/02/18, his previously described back and B hip pain (L>R) symptoms show no change.  He states that today is good day but reports more hip pain prior to that. Current symptoms are mild & are radiating to B hips and proximal ant thigh (L>R) He has been taking Valium 5 mg as a muscle relaxer with some relief (rarely).  He continues to use ice and compression prn. He has been HEP with no trouble, 5-6 days weekly.    04/23/2018: Compared to the last office visit, his previously described symptoms show no change. He does note tightness on the lateral aspect of the left hip.  Current symptoms are mild & are radiating to the lateral L hip. He has pain in the anterior thigh if he sits too long.  No change to  medication regimen or other modalities. He continues to do HEP 5-6 days weekly with no difficulty. He stretches daily.   05/07/2018: Compared to the last office visit, his previously described symptoms are worsening, he reports increased tightness in both legs/hamstrings. He continues to have tightness in lower back, hips, and groin. Pain is constant. Sx are worse the day after before more active. The left hip is tender to palpation, feels bruised. Aches and pains dont bother him at night but makes it hard to get out of bed.  Current symptoms are moderate & are radiating to both hips, legs, and groin.  He reports o change to  medication regimen or other modalities. He continues to do HEP 5-6 days weekly with no difficulty. He stretches daily.   06/04/2018: Compared to the last office visit, his previously described symptoms are worsening. He cent over doing laundry last night and it felt like someone poked him in his back with a knight, more mid back. He has noticed a burning sensation in his mis back that radiate across the L side of the ribs. He notes that the skin is tender to touch. He has tried using Renew lotion. He has also noticed some cramping in his hamstrings which is worse when sitting for prolonged period of time.  Current symptoms are mild & are radiating to the B LE and mid back.  He has been taking IBU with some relief. He takes Valium prn as muscle relaxer, uses sparingly at bedtime.   06/11/2018: Compared to the last office visit, his previously described symptoms are worsening, sx started flaring up around 5:00 AM today. He cannot recall doing anything to cause the flare up. He has been waking up at night with "fits of coughing".  Current symptoms are moderate-severe (8/10) & are radiating to the R gluteal and hip region.  He has been taking IBU prn. He did take a Valium about 20 minutes ago.   06/18/2018: Compared to the last office visit on 06/11/18, his previously described low back pain symptoms are improving. He notes less pain and less stiffness in his back.  Current symptoms are mild & are nonradiating He has been taking IBU and Valium 5mg  prn.  He has been taking prednisone that was prescribed at his last visit and has about 3 more days left of that rx.  Pelvis XR - 08/17/2003 L-spine XR - 04/20/17  REVIEW OF SYSTEMS: Denies night time disturbances. Denies fevers, chills, or night sweats. Denies unexplained weight loss. Denies personal history of cancer. Denies changes in bowel or bladder habits. Denies recent unreported falls. Denies new or worsening dyspnea or wheezing. Denies  headaches or dizziness.  Denies numbness, tingling or weakness in the extremities.  Denies dizziness or presyncopal episodes Denies lower extremity edema    HISTORY:  Prior history reviewed and updated per electronic medical record.  Social History   Occupational History  . Not on file  Tobacco Use  . Smoking status: Former Games developer  . Smokeless tobacco: Never Used  Substance and Sexual Activity  . Alcohol use: No    Comment: ocassional  . Drug use: No  . 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 X-Rays Lumbar Spine - 04/20/17 normal, no significant changes Responds well to dry needling and osteopathic manipulation   OBJECTIVE:  VS:  HT:6' 2.5" (189.2 cm)   WT:217 lb 6.4 oz (98.6 kg)  BMI:27.55    BP:118/86  HR:84bpm  TEMP: ( )  RESP:95 %   PHYSICAL EXAM: CONSTITUTIONAL: Well-developed, Well-nourished and In no acute distress PSYCHIATRIC: Alert & appropriately interactive. and Not depressed or anxious appearing. RESPIRATORY: No increased work of breathing and Trachea Midline EYES: Pupils are equal., EOM intact without nystagmus. and No scleral icterus.  VASCULAR EXAM: Warm and well perfused NEURO: unremarkable  MSK Exam: Back and neck and lower extremities  Well aligned, no significant deformity. No overlying skin changes. Generalized tightness with marked anterior chain dominance No focal bony tenderness   RANGE OF MOTION & STRENGTH  Tight hamstrings bilaterally with overall good mobility.  This is significantly improved and in the past.  With improved tissue texture changes.     SPECIALITY TESTING:  Negative Spurling's compression test bilaterally, normal brachial plexus squeeze       ASSESSMENT   1. Chronic bilateral low back pain without sciatica   2. Degeneration of lumbar intervertebral disc   3. Right hip flexor tightness   4. Somatic dysfunction of lower extremity   5. Somatic dysfunction of thoracic region   6. Somatic dysfunction of lumbar region   7. Somatic dysfunction of rib cage region   8. Somatic dysfunction of pelvis region   9. Somatic dysfunction of sacral region     PLAN:  Pertinent additional documentation may be included in corresponding procedure notes, imaging studies, problem based documentation and patient instructions.  Procedures:  Osteopathic manipulation was performed today based on physical exam findings.  Patient has responded well to osteopathic manipulation previously the prior manipulation did not provide permanent long lasting relief.  The patient does feel as though there was significant benefit to the prior manipulation and they wished for repeat manipulation today.  They understand that home therapeutic exercises are critical part of the healing/treatment process and will continue with self treatment between now and their next visit as outlined below.  The patient understands that the frequency of visits is meant to provide a stimulus to promote the body's own ability to heal and is not meant to be the sole means for improvement in their symptoms.  Medications:  No orders of the defined types were placed in this encounter.  Discussion/Instructions: No problem-specific Assessment & Plan notes found for this encounter.  . Continue previously prescribed home exercise program.  . Discussed red flag symptoms that warrant earlier emergent evaluation and patient voices understanding. . Activity modifications and the importance of avoiding exacerbating activities (limiting pain to no more than a 4 / 10 during or following  activity) recommended and discussed.  Follow-up:  . Return in about 2 weeks (around 07/02/2018) for consideration of repeat Osteopathic Manipulation.  . At follow up will plan: repeat osteopathic manipulation    CMA/ATC served as scribe during this visit. History, Physical, and Plan performed by medical provider. Documentation and orders reviewed and attested to.      Andrena Mews, DO    [facility]

## 2018-07-02 ENCOUNTER — Encounter: Payer: Self-pay | Admitting: Sports Medicine

## 2018-07-02 ENCOUNTER — Ambulatory Visit (INDEPENDENT_AMBULATORY_CARE_PROVIDER_SITE_OTHER): Payer: BLUE CROSS/BLUE SHIELD | Admitting: Sports Medicine

## 2018-07-02 VITALS — BP 132/88 | HR 84 | Ht 74.5 in | Wt 221.6 lb

## 2018-07-02 DIAGNOSIS — M9907 Segmental and somatic dysfunction of upper extremity: Secondary | ICD-10-CM

## 2018-07-02 DIAGNOSIS — M545 Low back pain: Secondary | ICD-10-CM

## 2018-07-02 DIAGNOSIS — M9901 Segmental and somatic dysfunction of cervical region: Secondary | ICD-10-CM

## 2018-07-02 DIAGNOSIS — M9903 Segmental and somatic dysfunction of lumbar region: Secondary | ICD-10-CM

## 2018-07-02 DIAGNOSIS — M25511 Pain in right shoulder: Secondary | ICD-10-CM

## 2018-07-02 DIAGNOSIS — M9908 Segmental and somatic dysfunction of rib cage: Secondary | ICD-10-CM

## 2018-07-02 DIAGNOSIS — M5136 Other intervertebral disc degeneration, lumbar region: Secondary | ICD-10-CM | POA: Diagnosis not present

## 2018-07-02 DIAGNOSIS — M9905 Segmental and somatic dysfunction of pelvic region: Secondary | ICD-10-CM

## 2018-07-02 DIAGNOSIS — M9902 Segmental and somatic dysfunction of thoracic region: Secondary | ICD-10-CM

## 2018-07-02 DIAGNOSIS — G8929 Other chronic pain: Secondary | ICD-10-CM

## 2018-07-02 DIAGNOSIS — M9904 Segmental and somatic dysfunction of sacral region: Secondary | ICD-10-CM

## 2018-07-09 ENCOUNTER — Ambulatory Visit: Payer: BLUE CROSS/BLUE SHIELD | Admitting: Sports Medicine

## 2018-08-29 ENCOUNTER — Encounter: Payer: Self-pay | Admitting: Sports Medicine

## 2018-09-02 ENCOUNTER — Encounter: Payer: Self-pay | Admitting: Sports Medicine

## 2018-09-02 NOTE — Progress Notes (Signed)
Jonathan Strong. Jonathan Strong Sports Medicine [facility] at [facility] [phone]  Jonathan Strong - 33 y.o. male MRN [id number]  Date of birth: [birth year]  Visit Date: 07/02/2018  PCP: Jonathan Dark, MD   Referred by: Jonathan Dark, MD  SUBJECTIVE:   Chief Complaint  Patient presents with  . f/u back pain    pain in R shoulder.     HPI: Patient is here for follow-up of his back and right shoulder pain.  He has been responding well to osteopathic manipulation.  He has not been taking the prednisone he has been taking ibuprofen and Valium intermittently.   REVIEW OF SYSTEMS: Denies fevers, chills, recent weight gain or weight loss.  No night sweats. No significant nighttime awakenings due to this issue. Pt denies any change in bowel or bladder habits, muscle weakness, numbness or falls associated with this pain.  HISTORY:  Prior history reviewed and updated per electronic medical record.  Patient Active Problem List   Diagnosis Date Noted  . Stress 06/15/2017  . Degeneration of lumbar intervertebral disc 03/09/2017  . Chronic back pain, Low Back Pain 10/13/2014    Lumbarized S1 Eval with [name] in 2015-2016, epidural injections Hx radicular pain Sees chiroprator Reports only valium helps    Social History   Occupational History  . Not on file  Tobacco Use  . Smoking status: Former Games developer  . Smokeless tobacco: Never Used  Substance and Sexual Activity  . Alcohol use: No    Comment: ocassional  . Drug use: No  . 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  No acute distress.  Alert and appropriate. Good cervical and lumbar range of motion although there are functional limitations. Negative Spurling's compression test and Lhermitte's compression test.   Upper extremity lower extremity strength is 5/5 in all myotomes. Normal sensation Significant anterior chain dominant posture.    ASSESSMENT:   1. Chronic bilateral low back pain without sciatica   2. Degeneration of lumbar intervertebral disc   3. Acute pain of right shoulder   4. Somatic dysfunction of upper extremity   5. Somatic dysfunction of cervical region   6. Somatic dysfunction of thoracic region   7. Somatic dysfunction of lumbar region   8. Somatic dysfunction of rib cage region   9. Somatic dysfunction of pelvis region   10. Somatic dysfunction of sacral region     PROCEDURES:  PROCEDURE NOTE : OSTEOPATHIC MANIPULATION The decision today to treat with Osteopathic Manipulative Therapy (OMT) was based on physical exam findings. Verbal consent was obtained following a discussion with the patient regarding the of risks, benefits and potential side effects, including an acute pain flare,post manipulation soreness and need for repeat treatments. Minimal risk of injury to neurovascular structures with cervical manipulation previously discussed in detailed and reaffirmed today.   Contraindications to  OMT: NONE  Manipulation was performed as below: Regions Treated & Osteopathic Exam Findings  CERVICAL SPINE: OA - rotated right C5 Extended, rotated LEFT, sidebent RIGHT THORACIC SPINE:  T2 - 6 Neutral, rotated LEFT, sidebent RIGHT RIBS:  Rib 8 Right  Posterior LUMBAR SPINE:  L4 FRS LEFT (Flexed, Rotated & Sidebent) PELVIS:  Right psoas spasm Right anterior innonimate SACRUM:  L on L sacral torsion UPPER EXTREMITIES: UPPER EXTREM: Right pec minor tenderpoint Right trapezius tenderpoint   OMT Techniques Used    HVLA muscle energy myofascial release    The patient tolerated the treatment well and reported Improved symptoms following treatment today. Patient was given medications, exercises, stretches and lifestyle modifications per AVS and verbally.     PLAN:  Pertinent additional documentation may be included in corresponding procedure notes, imaging studies, problem based documentation and patient instructions.  No problem-specific Assessment & Plan notes found for this encounter.   Continue previously prescribed home exercise program.   Osteopathic manipulation was performed today based on physical exam findings.  Patient has responded well to osteopathic manipulation previously the prior manipulation did not provide permanent long lasting relief.  The patient does feel as though there was significant benefit to the prior manipulation and they wished for repeat manipulation today.  They understand that home therapeutic exercises are critical part of the healing/treatment process and will continue with self treatment between now and their next visit as outlined.  The patient understands that the frequency of visits is meant to provide a stimulus to promote the body's own ability to heal and is not meant to be the sole means for improvement in their symptoms.  Activity modifications and the importance of avoiding exacerbating activities (limiting pain to no more than a 4 / 10 during or following activity) recommended and discussed.  Discussed red flag symptoms that warrant earlier emergent evaluation and patient voices understanding.   No orders of the defined types were placed in this encounter.  Lab Orders  No laboratory test(s) ordered today   Imaging Orders  No imaging studies ordered today   Referral Orders  No referral(s) requested today    At follow up will plan to consider: repeat osteopathic manipulation  Return in about 1 week (around 07/09/2018) for consideration of repeat  Osteopathic Manipulation.          Jonathan Mews, DO    [place] [facility] Physician

## 2018-09-14 ENCOUNTER — Encounter: Payer: Self-pay | Admitting: Sports Medicine

## 2018-09-14 ENCOUNTER — Ambulatory Visit (INDEPENDENT_AMBULATORY_CARE_PROVIDER_SITE_OTHER): Payer: BLUE CROSS/BLUE SHIELD | Admitting: Sports Medicine

## 2018-09-14 VITALS — BP 124/86 | HR 81 | Ht 74.5 in | Wt 223.6 lb

## 2018-09-14 DIAGNOSIS — G8929 Other chronic pain: Secondary | ICD-10-CM

## 2018-09-14 DIAGNOSIS — M9903 Segmental and somatic dysfunction of lumbar region: Secondary | ICD-10-CM

## 2018-09-14 DIAGNOSIS — M9908 Segmental and somatic dysfunction of rib cage: Secondary | ICD-10-CM

## 2018-09-14 DIAGNOSIS — M9905 Segmental and somatic dysfunction of pelvic region: Secondary | ICD-10-CM

## 2018-09-14 DIAGNOSIS — M545 Low back pain: Secondary | ICD-10-CM

## 2018-09-14 DIAGNOSIS — M9902 Segmental and somatic dysfunction of thoracic region: Secondary | ICD-10-CM | POA: Diagnosis not present

## 2018-09-14 DIAGNOSIS — M5136 Other intervertebral disc degeneration, lumbar region: Secondary | ICD-10-CM | POA: Diagnosis not present

## 2018-09-14 DIAGNOSIS — M24552 Contracture, left hip: Secondary | ICD-10-CM

## 2018-09-14 DIAGNOSIS — M9904 Segmental and somatic dysfunction of sacral region: Secondary | ICD-10-CM

## 2018-09-14 MED ORDER — DIAZEPAM 5 MG PO TABS: 5.0000 mg | ORAL_TABLET | Freq: Three times a day (TID) | ORAL | 0 refills | Status: DC | PRN

## 2018-09-14 NOTE — Progress Notes (Signed)
[name] [name] Sports Medicine [facility] at [facility] [phone]  Toddy Waugh - 33 y.o. male MRN [id number]  Date of birth: 07/23/1986  Visit Date: September 14, 2018  PCP: [name], MD   Referred by: [name], MD  SUBJECTIVE:  Chief Complaint  Patient presents with  . Follow-up    Low back and R shoulder.  Valium, Amitriptyline.    HPI: Patient is here with ongoing back and left posterior leg pain.  He has had tightness that is described as mild over the past several months.  He has responded to dry needling and osteopathic manipulation well in the past and is requesting this to be repeated again today as well as a referral.  He is continue take Valium intermittently for severe muscle spasms but has been able to wean off of the amitriptyline completely.  He is taking ibuprofen intermittently.  Performing home therapeutic exercises including stretching on an almost daily basis.  REVIEW OF SYSTEMS: No significant nighttime awakenings due to this issue. Denies fevers, chills, recent weight gain or weight loss.  No night sweats.  Pt denies any change in bowel or bladder habits, muscle weakness, numbness or falls associated with this pain.  HISTORY:  Prior history reviewed and updated per electronic medical record.  Patient Active Problem List   Diagnosis Date Noted  . Stress 06/15/2017  . Degeneration of lumbar intervertebral disc 03/09/2017  . Chronic back pain, Low Back Pain 10/13/2014    Lumbarized S1 Eval with [name] in 2015-2016, epidural injections Hx radicular pain Sees chiroprator Reports only valium helps    Social History   Occupational History  . Not on file  Tobacco Use  . Smoking status: Former Games developer  . Smokeless tobacco: Never Used  Substance and Sexual Activity  . Alcohol use: No    Comment: ocassional  . Drug use: No  . 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 No acute distress.  Alert and appropriate. Good cervical and lumbar range of motion although there are functional limitations. Negative Spurling's compression test and Lhermitte's compression test.   Upper extremity lower extremity strength is 5/5 in all myotomes. Normal sensation Significant anterior chain dominant posture. Left hip is tight   ASSESSMENT:   1. Chronic bilateral low back pain without sciatica   2. Degeneration of lumbar intervertebral disc   3. Somatic dysfunction of thoracic region   4. Somatic dysfunction of lumbar region   5. Somatic dysfunction of rib cage region   6. Somatic dysfunction of pelvis region   7. Somatic dysfunction of sacral region   8. Left hip flexor tightness     PROCEDURES:  PROCEDURE NOTE: OSTEOPATHIC MANIPULATION  The decision today to treat with Osteopathic Manipulative Therapy (OMT) was based on physical exam findings. Verbal consent was obtained following a discussion with  the patient regarding the of risks, benefits and potential side effects, including an acute pain flare,post manipulation soreness and need for repeat treatments.   Contraindications to OMT: NONE Manipulation was performed as below: Regions Treated & Osteopathic Exam Findings THORACIC SPINE:  T5 - 8 Neutral, rotated RIGHT, sidebent LEFT RIBS:  Rib 8 Right  Inhalation dysfunction (depressed/exhaled) LUMBAR SPINE:  L3 FRS LEFT (Flexed, Rotated & Sidebent) PELVIS:  Left psoas spasm Left anterior innonimate SACRUM:  R on R sacral torsion  OMT  Techniques Used: HVLA muscle energy myofascial release  The patient tolerated the treatment well and reported Improved symptoms following treatment today. Patient was given medications, exercises, stretches and lifestyle modifications per AVS and verbally.      PLAN:  Pertinent additional documentation may be included in corresponding procedure notes, imaging studies, problem based documentation and patient instructions.  No problem-specific Assessment & Plan notes found for this encounter.   Refills for Valium provided today as well as referral to physical therapy for repeat dry needling his hamstring has been tight and this will benefit as well as likely hip flexor targeted dry needling.  Home Therapeutic exercises prescribed today per procedure note.  Discussed the underlying features of tight hip flexors leading to crouched, fetal like position that results in spinal column compression.  Including lumbar hyperflexion with hypermobility, thoracic flexion with restrictive rotation and cervical lordosis reversal.   Osteopathic manipulation was performed today based on physical exam findings.  Patient has responded well to osteopathic manipulation previously the prior manipulation did not provide permanent long lasting relief.  The patient does feel as though there was significant benefit to the prior manipulation and they wished for repeat manipulation today.  They understand that home therapeutic exercises are critical part of the healing/treatment process and will continue with self treatment between now and their next visit as outlined.  The patient understands that the frequency of visits is meant to provide a stimulus to promote the body's own ability to heal and is not meant to be the sole means for improvement in their symptoms.  Activity modifications and the importance of avoiding exacerbating activities (limiting pain to no more than a 4 / 10 during or following activity) recommended  and discussed.  Discussed red flag symptoms that warrant earlier emergent evaluation and patient voices understanding.   Meds ordered this encounter  Medications  . diazepam (VALIUM) 5 MG tablet    Sig: Take 1 tablet (5 mg total) by mouth every 8 (eight) hours as needed for anxiety or muscle spasms.    Dispense:  30 tablet    Refill:  0   Lab Orders  No laboratory test(s) ordered today   Imaging Orders  No imaging studies ordered today    Referral Orders     Ambulatory referral to Physical Therapy  At follow up will plan to consider: repeat osteopathic manipulation  Return if symptoms worsen or fail to improve.          Andrena Mews, DO    [place] [facility] Physician

## 2018-09-16 ENCOUNTER — Ambulatory Visit: Payer: BLUE CROSS/BLUE SHIELD | Admitting: Physical Therapy

## 2018-09-28 ENCOUNTER — Encounter: Payer: Self-pay | Admitting: Sports Medicine

## 2018-09-28 ENCOUNTER — Ambulatory Visit: Payer: BLUE CROSS/BLUE SHIELD | Admitting: Sports Medicine

## 2018-09-28 VITALS — BP 130/92 | HR 71 | Ht 74.5 in | Wt 224.0 lb

## 2018-09-28 DIAGNOSIS — M5136 Other intervertebral disc degeneration, lumbar region: Secondary | ICD-10-CM | POA: Diagnosis not present

## 2018-09-28 DIAGNOSIS — G8929 Other chronic pain: Secondary | ICD-10-CM | POA: Diagnosis not present

## 2018-09-28 DIAGNOSIS — M9905 Segmental and somatic dysfunction of pelvic region: Secondary | ICD-10-CM

## 2018-09-28 DIAGNOSIS — M545 Low back pain: Secondary | ICD-10-CM | POA: Diagnosis not present

## 2018-09-28 DIAGNOSIS — M9903 Segmental and somatic dysfunction of lumbar region: Secondary | ICD-10-CM

## 2018-09-28 DIAGNOSIS — M9902 Segmental and somatic dysfunction of thoracic region: Secondary | ICD-10-CM

## 2018-09-28 NOTE — Patient Instructions (Addendum)
[facility] Acupuncture with Rob Balkind -  [phone] ; [email or web address]

## 2018-09-28 NOTE — Progress Notes (Signed)
Jonathan Strong. Jonathan Strong Sports Medicine [facility] at [facility] [phone]  Briceson Skidgel - 33 y.o. male MRN [id number]  Date of birth: 02-23-1986  Visit Date: September 30, 2018  PCP: [name], MD   Referred by: [name], MD  SUBJECTIVE:   Chief Complaint  Patient presents with  . Follow-up    Low back pain.  Valium.  Referred to PT at last visit.    HPI: Patient is here for ongoing low back and left hip pain.  He feels as though he is walking crooked.  Pain is radiating down the posterior aspect of both lower extremities towards his knees but not past.  He was previously referred to physical therapy for dry needling however his insurance has changed and he unfortunately is not in network with [facility] at this time.  He has been taking Valium but had discontinued amitriptyline.  REVIEW OF SYSTEMS: No significant nighttime awakenings due to this issue. Denies fevers, chills, recent weight gain or weight loss.  No night sweats.  Pt denies any change in bowel or bladder habits, muscle weakness, numbness or falls associated with this pain.  HISTORY:  Prior history reviewed and updated per electronic medical record.  Patient Active Problem List   Diagnosis Date Noted  . Stress 06/15/2017  . Degeneration of lumbar intervertebral disc 03/09/2017  . Chronic back pain, Low Back Pain 10/13/2014    Lumbarized S1 Eval with [name] in 2015-2016, epidural injections Hx radicular pain Sees chiroprator Reports only valium helps    Social History   Occupational History  . Not on file  Tobacco Use  . Smoking status: Former Games developer  . Smokeless tobacco: Never Used  Substance and Sexual Activity  . Alcohol use: No    Comment: ocassional  . Drug use: No  . 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 No acute distress.  Alert and appropriate. Patient walks with a stiff gait due to favoring his back.  He does have a anteriorly tilted pelvis.  Negative straight leg raises bilaterally.  Left-sided hip flexor contracture but good internal and external rotation.  Negative logroll and Stinchfield test.  He does have bilateral lower lumbar muscle spasms.   ASSESSMENT:   1. Chronic bilateral low back pain without sciatica   2. Degeneration of lumbar intervertebral disc   3. Somatic dysfunction of lumbar region   4. Somatic dysfunction of pelvis region   5. Somatic dysfunction of thoracic region     PROCEDURES:    PROCEDURE NOTE : OSTEOPATHIC MANIPULATION The decision today to treat with Osteopathic Manipulative Therapy (OMT) was based on physical exam findings. Verbal consent was obtained following a discussion with the patient regarding the of risks, benefits and potential side effects, including an acute pain flare,post manipulation soreness and need for repeat treatments.    Contraindications to  OMT: NONE  Manipulation was performed as below: Regions Treated & Osteopathic Exam Findings  THORACIC SPINE:  T6 - 8 Neutral, rotated LEFT, sidebent RIGHT LUMBAR SPINE:  L3 FRS RIGHT (Flexed, Rotated & Sidebent) PELVIS:  Left psoas spasm Left anterior innonimate   OMT Techniques Used  HVLA muscle energy myofascial release HVLA - Long Lever    The patient tolerated the treatment well and reported Improved symptoms following treatment today. Patient was given medications, exercises, stretches and lifestyle modifications per AVS and verbally.     PLAN:  Pertinent additional documentation  may be included in corresponding procedure notes, imaging studies, problem based documentation and patient instructions.  No problem-specific Assessment & Plan notes found for this encounter.  Information for dry needling through [facility] provided today.  Continue previously prescribed home exercise program.   Discussed the underlying features of tight hip flexors leading to crouched, fetal like position that results in spinal column compression.  Including lumbar hyperflexion with hypermobility, thoracic flexion with restrictive rotation and cervical lordosis reversal.   Osteopathic manipulation was performed today based on physical exam findings.  Patient has responded well to osteopathic manipulation previously the prior manipulation did not provide permanent long lasting relief.  The patient does feel as though there was significant benefit to the prior manipulation and they wished for repeat manipulation today.  They understand that home therapeutic exercises are critical part of the healing/treatment process and will continue with self treatment between now and their next visit as outlined.  The patient understands that the frequency of visits is meant to provide a stimulus to promote the body's own ability to heal and is not meant to be the sole means for improvement in their symptoms.  Activity modifications and the importance of avoiding exacerbating activities (limiting pain to no more than a 4 / 10 during or following activity) recommended and discussed.  Discussed red flag symptoms that warrant earlier emergent evaluation and patient voices understanding.   No orders of the defined types were placed in this encounter.  Lab Orders  No laboratory test(s) ordered today   Imaging Orders  No imaging studies ordered today   Referral Orders  No referral(s) requested today    At follow up will plan to consider: repeat osteopathic manipulation  Return if symptoms worsen or fail to  improve.          Andrena Mews, DO    [place] [facility] Physician

## 2018-09-30 ENCOUNTER — Encounter: Payer: Self-pay | Admitting: Sports Medicine

## 2018-10-01 ENCOUNTER — Ambulatory Visit: Payer: BLUE CROSS/BLUE SHIELD | Admitting: Psychology

## 2018-10-29 ENCOUNTER — Ambulatory Visit: Payer: BLUE CROSS/BLUE SHIELD | Admitting: Physical Therapy

## 2018-11-03 ENCOUNTER — Ambulatory Visit (INDEPENDENT_AMBULATORY_CARE_PROVIDER_SITE_OTHER): Payer: PRIVATE HEALTH INSURANCE | Admitting: Psychology

## 2018-11-03 DIAGNOSIS — F4322 Adjustment disorder with anxiety: Secondary | ICD-10-CM | POA: Diagnosis not present

## 2019-05-13 ENCOUNTER — Telehealth: Payer: Self-pay | Admitting: Family Medicine

## 2019-05-13 NOTE — Telephone Encounter (Signed)
SEE BELOW.

## 2019-05-13 NOTE — Telephone Encounter (Signed)
**  Patient states that he has been waiting on this prescription since yesterday. States that he pull out his back and is needing this medication asap. Advised that pharmacy likely sent refill request to last prescribing provider (Dr Paulla Fore). Please advise.**  Medication Refill - Medication: diazepam (VALIUM) 5 MG tablet   Has the patient contacted their pharmacy? Yes.   (Agent: If no, request that the patient contact the pharmacy for the refill.) (Agent: If yes, when and what did the pharmacy advise?)  Preferred Pharmacy (with phone number or street name): [id number] - [place], [place]. AT [address]  Agent: Please be advised that RX refills may take up to 3 business days. We ask that you follow-up with your pharmacy.

## 2019-05-13 NOTE — Telephone Encounter (Signed)
Requested medication (s) are due for refill today: yes  Requested medication (s) are on the active medication list: yes  Last refill:  09/14/2018  Future visit scheduled: no  Notes to clinic:  Refill cannot be delegated   Requested Prescriptions  Pending Prescriptions Disp Refills   diazepam (VALIUM) 5 MG tablet 30 tablet 0    Sig: Take 1 tablet (5 mg total) by mouth every 8 (eight) hours as needed for anxiety or muscle spasms.     Not Delegated - Psychiatry:  Anxiolytics/Hypnotics Failed - 05/13/2019  1:44 PM      Failed - This refill cannot be delegated      Failed - Urine Drug Screen completed in last 360 days.      Failed - Valid encounter within last 6 months    Recent Outpatient Visits          7 months ago Chronic bilateral low back pain without sciatica   [facility], Legrand Como D, DO   8 months ago Chronic bilateral low back pain without sciatica   [facility] Darliss Cheney, Legrand Como D, DO   10 months ago Chronic bilateral low back pain without sciatica   [facility] [place], Legrand Como D, DO   10 months ago Chronic bilateral low back pain without sciatica   [facility] [address] Teresa Coombs D, DO   11 months ago Degeneration of lumbar intervertebral disc    [facility] Che[facility], Juanda Bond, DO

## 2019-05-16 NOTE — Telephone Encounter (Signed)
Patient stated that this is a history and he shouldn't have to be seen.  He saw Dr. Paulla Fore and his issues are documented.  He would like to talk to a nurse.Jonathan Strong

## 2019-05-16 NOTE — Telephone Encounter (Signed)
See note

## 2019-05-17 NOTE — Telephone Encounter (Signed)
Left voice message for patient to call clinic. Patient last OV with Dr.parker was 06/2017.Patient needs to schedule an appointment.

## 2019-05-23 ENCOUNTER — Encounter: Payer: Self-pay | Admitting: Family Medicine

## 2019-05-23 ENCOUNTER — Ambulatory Visit (INDEPENDENT_AMBULATORY_CARE_PROVIDER_SITE_OTHER): Payer: PRIVATE HEALTH INSURANCE | Admitting: Family Medicine

## 2019-05-23 DIAGNOSIS — M545 Low back pain: Secondary | ICD-10-CM | POA: Diagnosis not present

## 2019-05-23 DIAGNOSIS — G8929 Other chronic pain: Secondary | ICD-10-CM | POA: Diagnosis not present

## 2019-05-23 DIAGNOSIS — M67439 Ganglion, unspecified wrist: Secondary | ICD-10-CM | POA: Diagnosis not present

## 2019-05-23 MED ORDER — DIAZEPAM 10 MG PO TABS: 10.0000 mg | ORAL_TABLET | Freq: Three times a day (TID) | ORAL | 1 refills | Status: DC | PRN

## 2019-05-23 NOTE — Assessment & Plan Note (Signed)
We will refill Valium.  Database with no red flags.  Continue dry needling and physical therapy.

## 2019-05-23 NOTE — Progress Notes (Signed)
    Chief Complaint:  Jonathan Strong is a 33 y.o. male who presents today for a virtual office visit with a chief complaint of chronic low back pain.   Assessment/Plan:  Chronic back pain, Low Back Pain We will refill Valium.  Database with no red flags.  Continue dry needling and physical therapy.  Wrist cyst No red flags.  Reassuring that has been present for several years with no significant change.  Likely represents a ganglion cyst.  Offered aspiration or referral to surgery however patient declined.  Continue with watchful waiting.    Subjective:  HPI:  Chronic low back pain Has follow-up with sports medicine in the past.  Was last seen about 7 months ago by sports medicine.  He has been using Valium as needed for muscle spasms.  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  He recently got dry needling done at [place].  This worked very well.  He has been using Valium as needed for muscle spasms.  He has been trying several home exercises with modest improvement.  No reported lower extremity weakness or numbness.  Patient also has a cyst on his left wrist.  This is been present for several years.  Has not changed significantly though does note that it has been more "solid" than previous.  ROS: Per HPI  PMH: He reports that he has quit smoking. He has never used smokeless tobacco. He reports that he does not drink alcohol or use drugs.      Objective/Observations  Physical Exam: Gen: NAD, resting comfortably Pulm: Normal work of breathing Neuro: Grossly normal, moves all extremities Psych: Normal affect and thought content  Virtual Visit via Video   I connected with Jonathan Strong on 05/23/19 at 11:40 AM EDT by a video enabled telemedicine application and verified that I am speaking with the correct person using two identifiers. I discussed the limitations of evaluation and management by telemedicine and the  availability of in person appointments. The patient expressed understanding and agreed to proceed.   Patient location: Home Provider location: [place] participating in the virtual visit: Myself and Patient     Jonathan Strong. Jerline Pain, MD 05/23/2019 11:19 AM

## 2019-08-19 ENCOUNTER — Encounter: Payer: PRIVATE HEALTH INSURANCE | Admitting: Family Medicine

## 2019-08-20 IMAGING — DX DG LUMBAR SPINE COMPLETE 4+V
5 series · 5 of 5 positions shown · non-contrast
Comparison: None.

CLINICAL DATA: Low back pain

EXAM:
LUMBAR SPINE - COMPLETE 4+ VIEW

[lumbar spine ap]
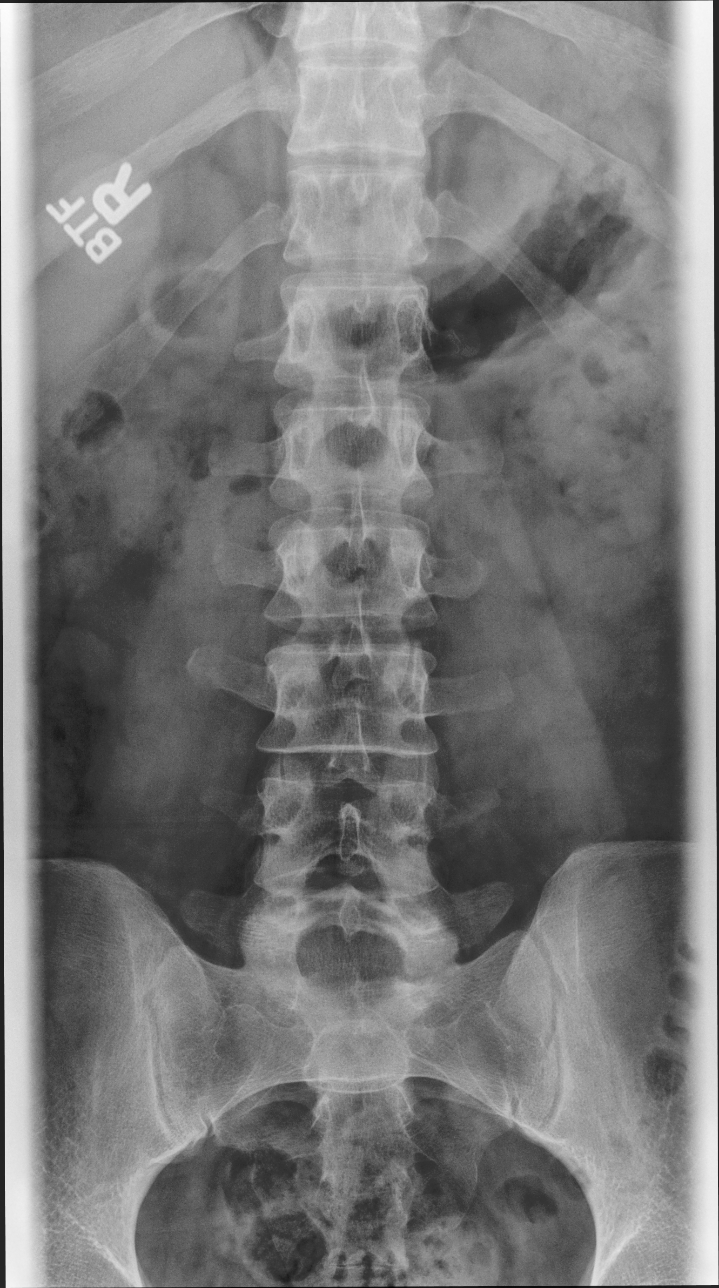

[lumbar spine oblique (1 of 2)]
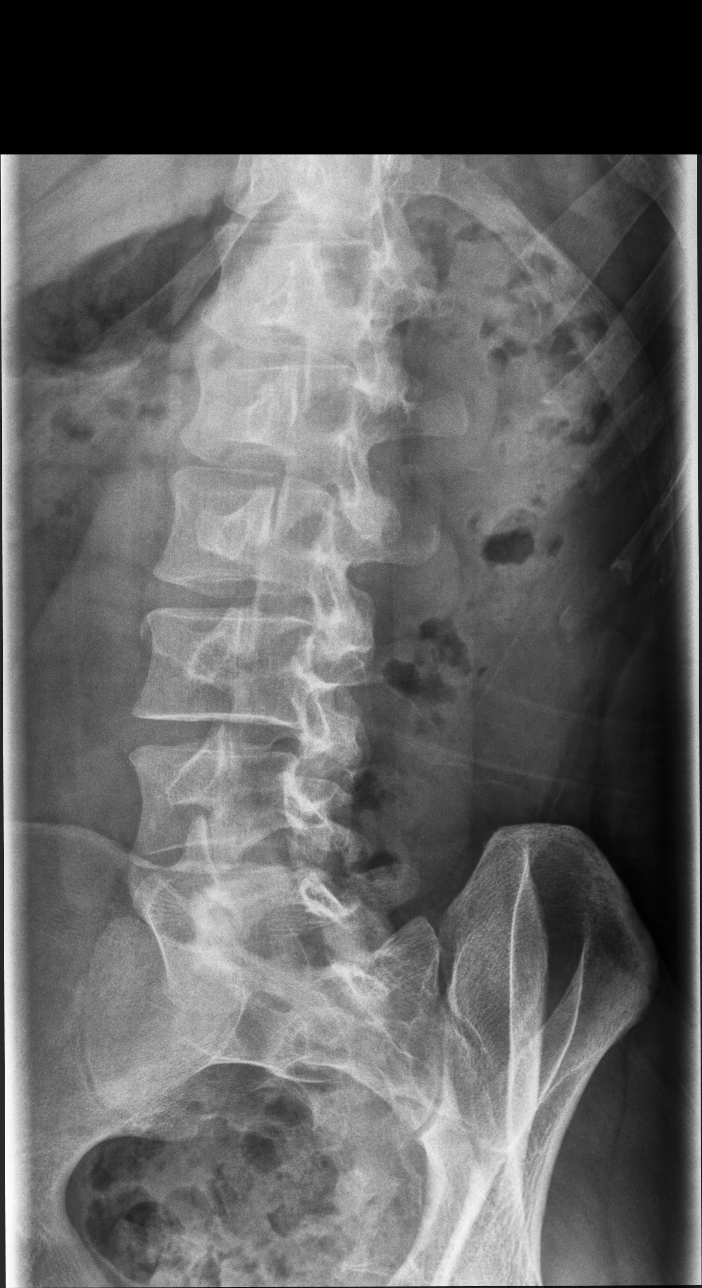

[lumbar spine oblique (2 of 2)]
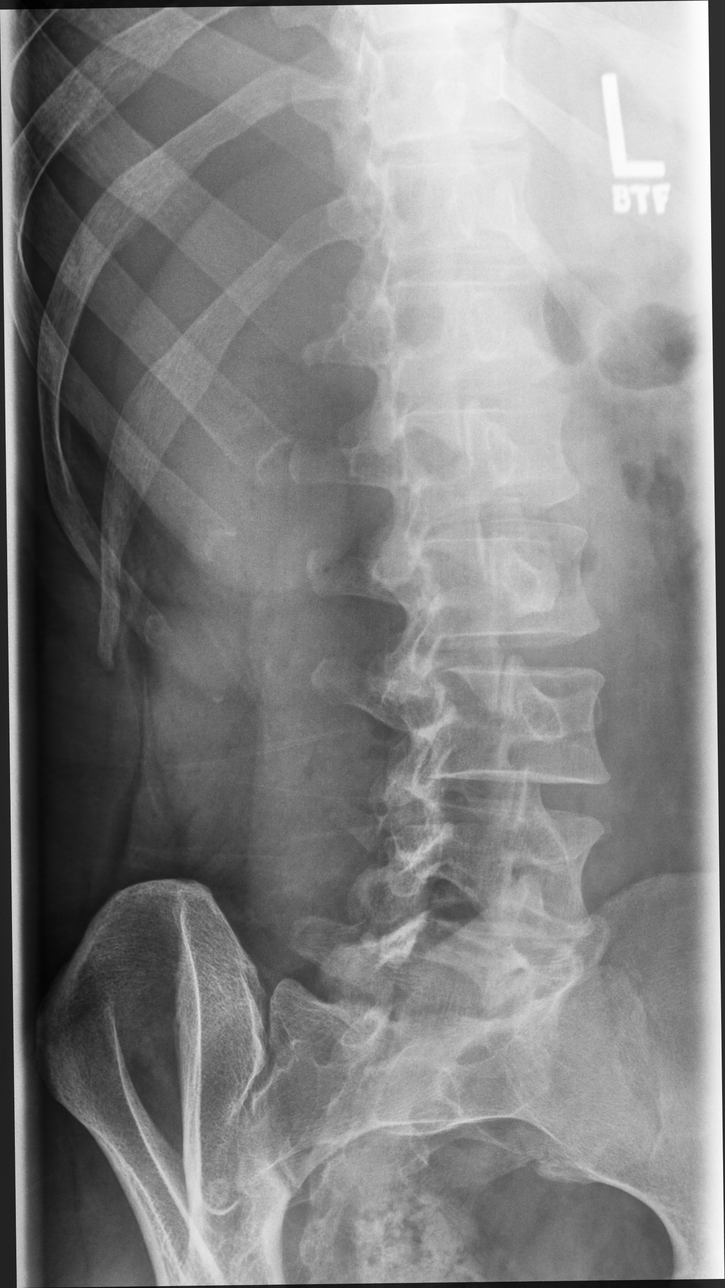

[lumbar spine lat (1 of 2)]
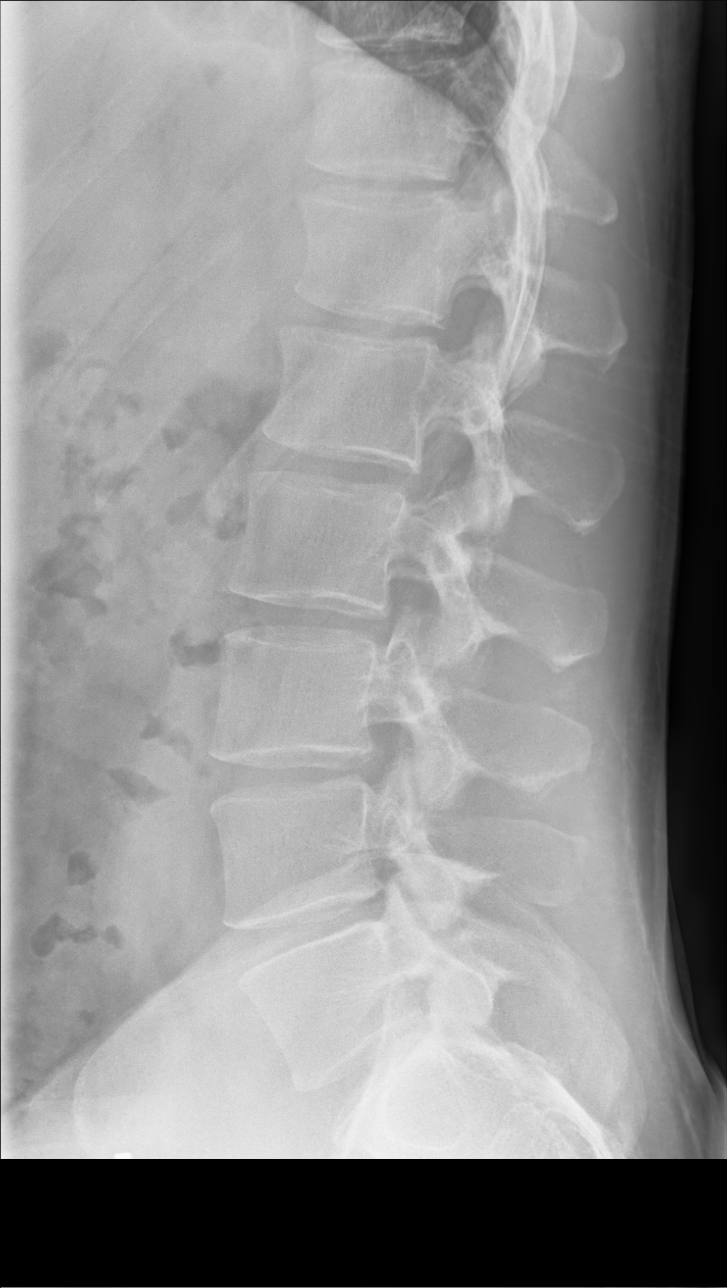

[lumbar spine lat (2 of 2)]
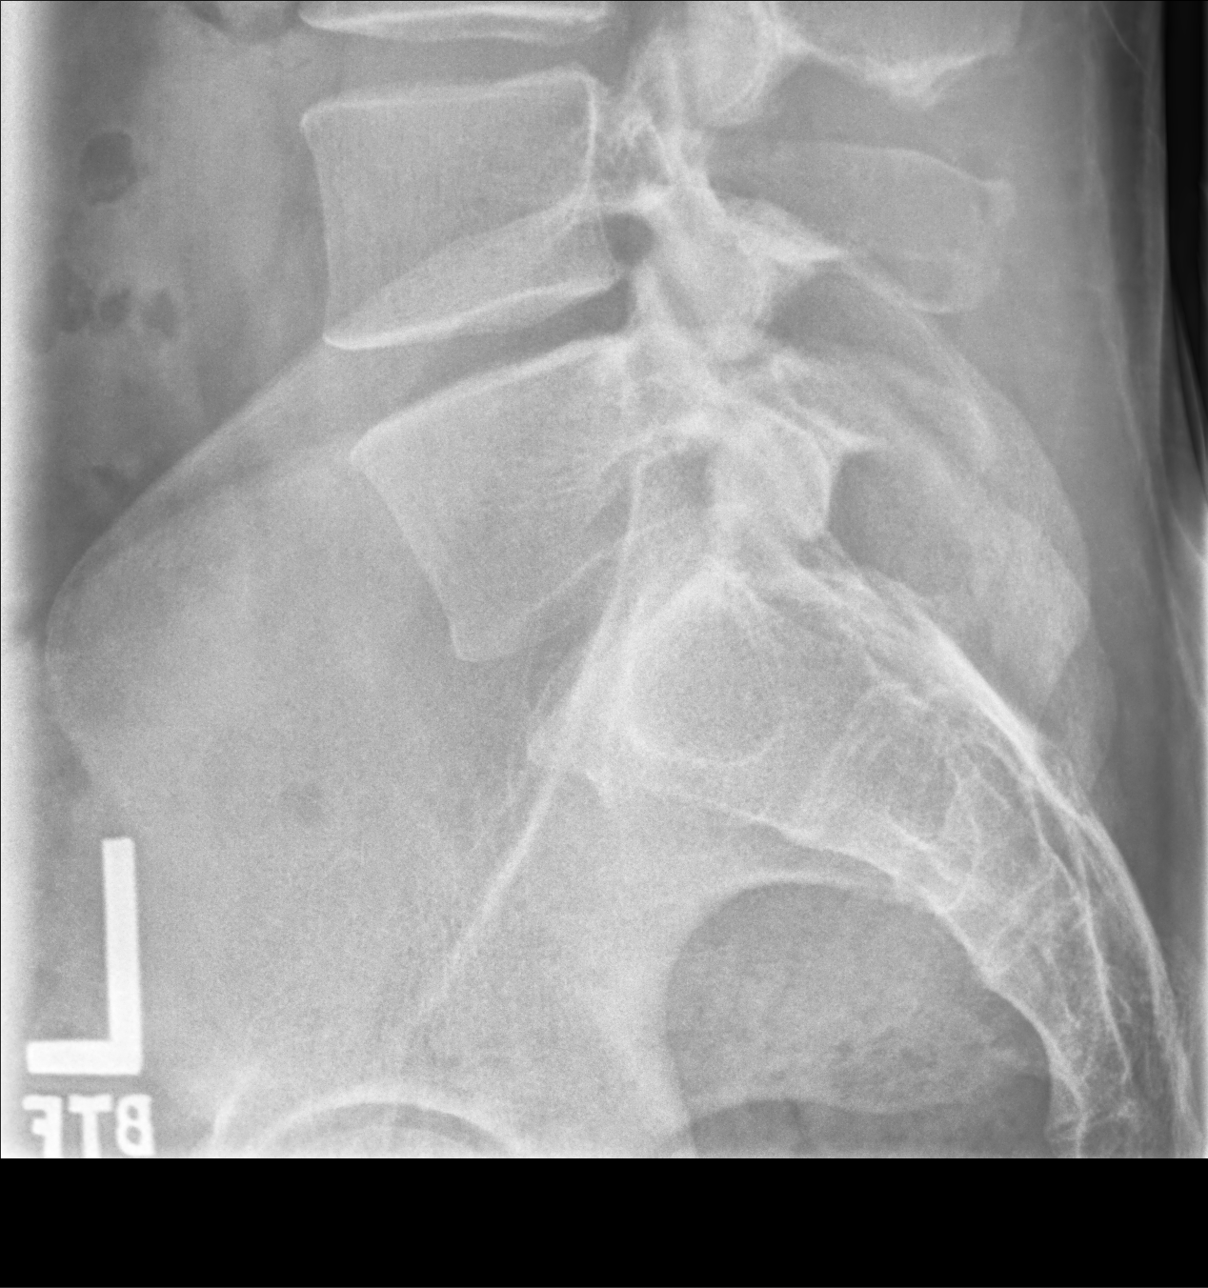

[5 of 5 positions shown; findings below may reference images not displayed]

FINDINGS: Six non rib-bearing lumbar type vertebral bodies. Disc spaces are
maintained. No fracture. SI joints are symmetric and unremarkable.
IMPRESSION: No acute bony abnormality.

## 2019-08-25 ENCOUNTER — Ambulatory Visit (INDEPENDENT_AMBULATORY_CARE_PROVIDER_SITE_OTHER): Payer: 59 | Admitting: Family Medicine

## 2019-08-25 ENCOUNTER — Other Ambulatory Visit: Payer: Self-pay

## 2019-08-25 ENCOUNTER — Encounter: Payer: Self-pay | Admitting: Family Medicine

## 2019-08-25 VITALS — BP 118/80 | HR 78 | Temp 97.6°F | Ht 74.5 in | Wt 216.2 lb

## 2019-08-25 DIAGNOSIS — Z0001 Encounter for general adult medical examination with abnormal findings: Secondary | ICD-10-CM

## 2019-08-25 DIAGNOSIS — Z13220 Encounter for screening for lipoid disorders: Secondary | ICD-10-CM

## 2019-08-25 DIAGNOSIS — M545 Low back pain, unspecified: Secondary | ICD-10-CM

## 2019-08-25 DIAGNOSIS — Z6827 Body mass index (BMI) 27.0-27.9, adult: Secondary | ICD-10-CM

## 2019-08-25 DIAGNOSIS — Z0000 Encounter for general adult medical examination without abnormal findings: Secondary | ICD-10-CM

## 2019-08-25 DIAGNOSIS — R200 Anesthesia of skin: Secondary | ICD-10-CM

## 2019-08-25 DIAGNOSIS — G8929 Other chronic pain: Secondary | ICD-10-CM

## 2019-08-25 DIAGNOSIS — K625 Hemorrhage of anus and rectum: Secondary | ICD-10-CM

## 2019-08-25 DIAGNOSIS — E663 Overweight: Secondary | ICD-10-CM

## 2019-08-25 LAB — COMPREHENSIVE METABOLIC PANEL
ALT: 41 U/L (ref 0–53)
AST: 25 U/L (ref 0–37)
Albumin: 4.9 g/dL (ref 3.5–5.2)
Alkaline Phosphatase: 61 U/L (ref 39–117)
BUN: 15 mg/dL (ref 6–23)
CO2: 29 mEq/L (ref 19–32)
Calcium: 10 mg/dL (ref 8.4–10.5)
Chloride: 101 mEq/L (ref 96–112)
Creatinine, Ser: 0.92 mg/dL (ref 0.40–1.50)
GFR: 93.21 mL/min (ref >60.00)
Glucose, Bld: 97 mg/dL (ref 70–99)
Potassium: 4.5 mEq/L (ref 3.5–5.1)
Sodium: 137 mEq/L (ref 135–145)
Total Bilirubin: 1.1 mg/dL (ref 0.2–1.2)
Total Protein: 7.2 g/dL (ref 6.0–8.3)

## 2019-08-25 LAB — CBC
HCT: 47.3 % (ref 39.0–52.0)
Hemoglobin: 16 g/dL (ref 13.0–17.0)
MCHC: 33.9 g/dL (ref 30.0–36.0)
MCV: 91.1 fl (ref 78.0–100.0)
Platelets: 234 10*3/uL (ref 150.0–400.0)
RBC: 5.2 Mil/uL (ref 4.22–5.81)
RDW: 12.8 % (ref 11.5–15.5)
WBC: 5.6 10*3/uL (ref 4.0–10.5)

## 2019-08-25 LAB — LIPID PANEL
Cholesterol: 231 mg/dL — ABNORMAL HIGH (ref 0–200)
HDL: 43.5 mg/dL (ref >39.00)
NonHDL: 187.51
Total CHOL/HDL Ratio: 5
Triglycerides: 255 mg/dL — ABNORMAL HIGH (ref 0.0–149.0)
VLDL: 51 mg/dL — ABNORMAL HIGH (ref 0.0–40.0)

## 2019-08-25 LAB — SARS-COV-2 IGG: SARS-COV-2 IgG: 0.03

## 2019-08-25 LAB — LDL CHOLESTEROL, DIRECT: Direct LDL: 139 mg/dL

## 2019-08-25 LAB — TSH: TSH: 1.2 u[IU]/mL (ref 0.35–4.50)

## 2019-08-25 NOTE — Assessment & Plan Note (Signed)
Stable. Will follow up with sports medicine

## 2019-08-25 NOTE — Progress Notes (Signed)
Chief Complaint:  Jonathan Strong is a 34 y.o. male who presents today for his annual comprehensive physical exam.    Assessment/Plan:  New/Acute Problems: Rectal Bleeding Likely internal hemorrhoids.  Will place referral to GI.  Check CBC today.  Paresthesias Likely carpal tunnel. Recommended night time splinting. He will follow-up with sports medicine.  Chronic Problems Addressed Today: Chronic back pain, Low Back Pain Stable. Will follow up with sports medicine   Body mass index is 27.39 kg/m. / Overweight BMI Metric Follow Up - 08/25/19 1044      BMI Metric Follow Up-Please document annually   BMI Metric Follow Up  Education provided        Preventative Healthcare: Check CBC, CMET, TSH, and lipid panel. Check covid antibody.   Patient Counseling(The following topics were reviewed and/or handout was given):  -Nutrition: Stressed importance of moderation in sodium/caffeine intake, saturated fat and cholesterol, caloric balance, sufficient intake of fresh fruits, vegetables, and fiber.  -Stressed the importance of regular exercise.   -Substance Abuse: Discussed cessation/primary prevention of tobacco, alcohol, or other drug use; driving or other dangerous activities under the influence; availability of treatment for abuse.   -Injury prevention: Discussed safety belts, safety helmets, smoke detector, smoking near bedding or upholstery.   -Sexuality: Discussed sexually transmitted diseases, partner selection, use of condoms, avoidance of unintended pregnancy and contraceptive alternatives.   -Dental health: Discussed importance of regular tooth brushing, flossing, and dental visits.  -Health maintenance and immunizations reviewed. Please refer to Health maintenance section.  Return to care in 1 year for next preventative visit.     Subjective:  HPI:  Started having some blood in stool. Bright red blood. No pain. Happens every couple of months. No abdominal pain. No  nausea or vomiting. No constipation or diarrhea.   Some tingling occasionally in hands. Often when waking up. This has been going   Lifestyle Diet: No specific eating plans.  Exercise: Busy with work.   Depression screen [facility] 2/9 07/20/2017  Decreased Interest 0  Down, Depressed, Hopeless 0  PHQ - 2 Score 0   ROS: Per HPI, otherwise a complete review of systems was negative.   PMH:  The following were reviewed and entered/updated in epic: Past Medical History:  Diagnosis Date  . History of chicken pox   . History of mononucleosis    treated at [facility] ER 3 weeks ago per patient  . Hyperlipidemia    Patient Active Problem List   Diagnosis Date Noted  . Stress 06/15/2017  . Degeneration of lumbar intervertebral disc 03/09/2017  . Chronic back pain, Low Back Pain 10/13/2014   No past surgical history on file.  Family History  Problem Relation Age of Onset  . Mental illness Brother   . Bipolar disorder Brother   . Schizophrenia Father     Medications- reviewed and updated Current Outpatient Medications  Medication Sig Dispense Refill  . diazepam (VALIUM) 10 MG tablet Take 1 tablet (10 mg total) by mouth every 8 (eight) hours as needed for anxiety or muscle spasms. 30 tablet 1   No current facility-administered medications for this visit.    Allergies-reviewed and updated No Known Allergies  Social History   Socioeconomic History  . Marital status: Married    Spouse name: Not on file  . Number of children: Not on file  . Years of education: Not on file  . Highest education level: Not on file  Occupational History  . Not on file  Tobacco Use  . Smoking status: Former Research scientist (life sciences)  . Smokeless tobacco: Never Used  Substance and Sexual Activity  . Alcohol use: No    Comment: ocassional  . Drug use: No  . Sexual activity: Not on file  Other Topics Concern  . 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 Difficulty of Paying Living Expenses: Not on file  Food Insecurity:   . Worried About Charity fundraiser in the Last Year: Not on file  . Ran Out of Food in the Last Year: Not on file  Transportation Needs:   . Lack of Transportation (Medical): Not on file  . Lack of Transportation (Non-Medical): Not on file  Physical Activity:   . Days of Exercise per Week: Not on file  . Minutes of Exercise per Session: Not on file  Stress:   . Feeling of Stress : Not on file  Social Connections:   . Frequency of Communication with Friends and Family: Not on file  . Frequency of Social Gatherings with Friends and Family: Not on file  . Attends Religious Services: Not on file  . Active Member of Clubs or Organizations: Not on file  . Attends Archivist Meetings: Not on file  . Marital Status: Not on file        Objective:  Physical Exam: BP 118/80   Pulse 78   Temp 97.6 F (36.4 C)   Ht 6' 2.5" (1.892 m)   Wt 216 lb 3.2 oz (98.1 kg)   SpO2 98%   BMI 27.39 kg/m   Body mass index is 27.39 kg/m. Wt Readings from Last 3 Encounters:  08/25/19 216 lb 3.2 oz (98.1 kg)  09/28/18 224 lb (101.6 kg)  09/14/18 223 lb 9.6 oz (101.4 kg)  Gen: NAD, resting comfortably HEENT: TMs normal bilaterally. OP clear. No thyromegaly noted.  CV: RRR with no murmurs appreciated Pulm: NWOB, CTAB with no crackles, wheezes, or rhonchi GI: Normal bowel sounds present. Soft, Nontender, Nondistended. MSK: no edema, cyanosis, or clubbing noted.  Negative Tinel at bilateral wrist and medial epicondyles.  Positive Phalen's. Skin: warm, dry Neuro: CN2-12 grossly intact. Strength 5/5 in upper and lower extremities. Reflexes symmetric and intact bilaterally.    Psych: Normal affect and thought content     Corday Wyka M. Jerline Pain, MD 08/25/2019 10:48 AM

## 2019-08-25 NOTE — Patient Instructions (Signed)
It was very nice to see you today!  You probably have carpal tunnel.  Please try using wrist splints at night to help with your symptoms.  Please follow-up with Dr. Paulla Fore for this.  You can google his name to find his website and to schedule an appointment.  His website is [email or web address]  Will place referral to gastroenterology for your bleeding.  You probably have hemorrhoids.  We will check blood work.  Come back in 1 year for your next physical or sooner if needed.  Take care, Dr Jerline Pain  Please try these tips to maintain a healthy lifestyle:   Eat at least 3 REAL meals and 1-2 snacks per day.  Aim for no more than 5 hours between eating.  If you eat breakfast, please do so within one hour of getting up.    Each meal should contain half fruits/vegetables, one quarter protein, and one quarter carbs (no bigger than a computer mouse)   Cut down on sweet beverages. This includes juice, soda, and sweet tea.     Drink at least 1 glass of water with each meal and aim for at least 8 glasses per day   Exercise at least 150 minutes every week.    Preventive Care 26-70 Years Old, Male Preventive care refers to lifestyle choices and visits with your health care provider that can promote health and wellness. This includes:  A yearly physical exam. This is also called an annual well check.  Regular dental and eye exams.  Immunizations.  Screening for certain conditions.  Healthy lifestyle choices, such as eating a healthy diet, getting regular exercise, not using drugs or products that contain nicotine and tobacco, and limiting alcohol use. What can I expect for my preventive care visit? Physical exam Your health care provider will check:  Height and weight. These may be used to calculate body mass index (BMI), which is a measurement that tells if you are at a healthy weight.  Heart rate and blood pressure.  Your skin for abnormal spots. Counseling Your health care  provider may ask you questions about:  Alcohol, tobacco, and drug use.  Emotional well-being.  Home and relationship well-being.  Sexual activity.  Eating habits.  Work and work Statistician. What immunizations do I need?  Influenza (flu) vaccine  This is recommended every year. Tetanus, diphtheria, and pertussis (Tdap) vaccine  You may need a Td booster every 10 years. Varicella (chickenpox) vaccine  You may need this vaccine if you have not already been vaccinated. Human papillomavirus (HPV) vaccine  If recommended by your health care provider, you may need three doses over 6 months. Measles, mumps, and rubella (MMR) vaccine  You may need at least one dose of MMR. You may also need a second dose. Meningococcal conjugate (MenACWY) vaccine  One dose is recommended if you are 1-39 years old and a Market researcher living in a residence hall, or if you have one of several medical conditions. You may also need additional booster doses. Pneumococcal conjugate (PCV13) vaccine  You may need this if you have certain conditions and were not previously vaccinated. Pneumococcal polysaccharide (PPSV23) vaccine  You may need one or two doses if you smoke cigarettes or if you have certain conditions. Hepatitis A vaccine  You may need this if you have certain conditions or if you travel or work in places where you may be exposed to hepatitis A. Hepatitis B vaccine  You may need this if you have certain conditions or if  you travel or work in places where you may be exposed to hepatitis B. Haemophilus influenzae type b (Hib) vaccine  You may need this if you have certain risk factors. You may receive vaccines as individual doses or as more than one vaccine together in one shot (combination vaccines). Talk with your health care provider about the risks and benefits of combination vaccines. What tests do I need? Blood tests  Lipid and cholesterol levels. These may be  checked every 5 years starting at age 55.  Hepatitis C test.  Hepatitis B test. Screening   Diabetes screening. This is done by checking your blood sugar (glucose) after you have not eaten for a while (fasting).  Sexually transmitted disease (STD) testing. Talk with your health care provider about your test results, treatment options, and if necessary, the need for more tests. Follow these instructions at home: Eating and drinking   Eat a diet that includes fresh fruits and vegetables, whole grains, lean protein, and low-fat dairy products.  Take vitamin and mineral supplements as recommended by your health care provider.  Do not drink alcohol if your health care provider tells you not to drink.  If you drink alcohol: ? Limit how much you have to 0-2 drinks a day. ? Be aware of how much alcohol is in your drink. In the [place] one drink equals one 12 oz bottle of beer (355 mL), one 5 oz glass of wine (148 mL), or one 1 oz glass of hard liquor (44 mL). Lifestyle  Take daily care of your teeth and gums.  Stay active. Exercise for at least 30 minutes on 5 or more days each week.  Do not use any products that contain nicotine or tobacco, such as cigarettes, e-cigarettes, and chewing tobacco. If you need help quitting, ask your health care provider.  If you are sexually active, practice safe sex. Use a condom or other form of protection to prevent STIs (sexually transmitted infections). What's next?  Go to your health care provider once a year for a well check visit.  Ask your health care provider how often you should have your eyes and teeth checked.  Stay up to date on all vaccines. This information is not intended to replace advice given to you by your health care provider. Make sure you discuss any questions you have with your health care provider. Document Revised: 07/08/2018 Document Reviewed: 07/08/2018 Elsevier Patient Education  2020 [employer].

## 2019-08-26 ENCOUNTER — Encounter: Payer: Self-pay | Admitting: Gastroenterology

## 2019-08-26 ENCOUNTER — Ambulatory Visit (INDEPENDENT_AMBULATORY_CARE_PROVIDER_SITE_OTHER): Payer: 59 | Admitting: Gastroenterology

## 2019-08-26 ENCOUNTER — Encounter: Payer: Self-pay | Admitting: Family Medicine

## 2019-08-26 VITALS — BP 122/68 | HR 100 | Temp 98.9°F | Ht 71.5 in | Wt 219.2 lb

## 2019-08-26 DIAGNOSIS — E785 Hyperlipidemia, unspecified: Secondary | ICD-10-CM | POA: Insufficient documentation

## 2019-08-26 DIAGNOSIS — K625 Hemorrhage of anus and rectum: Secondary | ICD-10-CM

## 2019-08-26 DIAGNOSIS — Z01818 Encounter for other preprocedural examination: Secondary | ICD-10-CM

## 2019-08-26 MED ORDER — NA SULFATE-K SULFATE-MG SULF 17.5-3.13-1.6 GM/177ML PO SOLN: 1.0000 | Freq: Once | ORAL | 0 refills | Status: AC

## 2019-08-26 NOTE — Patient Instructions (Signed)
If you are age 36 or older, your body mass index should be between 23-30. Your Body mass index is 30.15 kg/m. If this is out of the aforementioned range listed, please consider follow up with your Primary Care Provider.  If you are age 83 or younger, your body mass index should be between 19-25. Your Body mass index is 30.15 kg/m. If this is out of the aformentioned range listed, please consider follow up with your Primary Care Provider.   You have been scheduled for a colonoscopy. Please follow written instructions given to you at your visit today.  Please pick up your prep supplies at the pharmacy within the next 1-3 days. If you use inhalers (even only as needed), please bring them with you on the day of your procedure. Your physician has requested that you go to www.startemmi.com and enter the access code given to you at your visit today. This web site gives a general overview about your procedure. However, you should still follow specific instructions given to you by our office regarding your preparation for the procedure.  It was a pleasure to see you today!  Dr. Myrtie Neither

## 2019-08-26 NOTE — Progress Notes (Signed)
Please inform patient of the following:  Cholesterol levels are up slightly but everything else is NORMAL. Do not need to start medications. Recommend that he continue working on diet and exercise and we can recheck in a year or so.  [name]. [name], MD 08/26/2019 10:44 AM

## 2019-08-26 NOTE — Progress Notes (Signed)
[name] Consult Note:  History: Jonathan Strong 08/26/2019  Referring provider: Vivi Barrack, MD  Reason for consult/chief complaint: Rectal Bleeding (intermittent x 1 year, BRB on the toilet paper and in the toilet )   Subjective  HPI:  Jonathan Strong is a very pleasant 34 year old man referred by primary care for about a year of intermittent painless rectal bleeding on the paper and in the toilet bowl.  He denies abdominal pain, his bowel habits are regular with formed stool.  He denies dysphagia, odynophagia, nausea, vomiting, early satiety or weight loss.  No prior GI evaluation for this.   ROS:  Review of Systems  He denies chest pain dyspnea or dysuria  Past Medical History: Past Medical History:  Diagnosis Date  . History of chicken pox   . History of mononucleosis    treated at [facility] ER 3 weeks ago per patient  . Hyperlipidemia      Past Surgical History: Past Surgical History:  Procedure Laterality Date  . NO PAST SURGERIES       Family History: Family History  Problem Relation Age of Onset  . Mental illness Brother   . Bipolar disorder Brother   . Schizophrenia Father     Social History: Social History   Socioeconomic History  . Marital status: Married    Spouse name: Not on file  . Number of children: 2  . Years of education: Not on file  . Highest education level: Not on file  Occupational History  . Occupation: Architect  Tobacco Use  . Smoking status: Current Every Day Smoker    Types: Cigarettes  . Smokeless tobacco: Never Used  Substance and Sexual Activity  . Alcohol use: Yes    Comment: 7 per day  . Drug use: No  . Sexual activity: Not on file  Other Topics Concern  . 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 Difficulty of Paying Living Expenses: Not on file  Food Insecurity:   . Worried About Charity fundraiser in the Last Year: Not on file  . Ran Out of Food in the Last Year: Not on file  Transportation Needs:   . Lack of Transportation (Medical): Not on file  . Lack of Transportation (Non-Medical): Not on file  Physical Activity:   . Days of Exercise per Week: Not on file  . Minutes of Exercise per Session: Not on file  Stress:   . Feeling of Stress : Not on file  Social Connections:   . Frequency of Communication with Friends and Family: Not on file  . Frequency of Social Gatherings with Friends and Family: Not on file  . Attends Religious Services: Not on file  . Active Member of Clubs or Organizations: Not on file  . Attends Archivist Meetings: Not on file  . Marital Status: Not on file   Some nights he has 7-8 shots of liquor. Works in Architect, doing Equities trader work.  Allergies: No Known Allergies  Outpatient Meds: Current Outpatient Medications  Medication Sig Dispense Refill  . diazepam (  VALIUM) 10 MG tablet Take 1 tablet (10 mg total) by mouth every 8 (eight) hours as needed for anxiety or muscle spasms. 30 tablet 1  . Na Sulfate-K Sulfate-Mg Sulf 17.5-3.13-1.6 GM/177ML SOLN Take 1 kit by mouth once for 1 dose. 354 mL 0   No current facility-administered medications for this visit.      ___________________________________________________________________ Objective   Exam:  BP 122/68 (BP Location: Left Arm, Patient Position: Sitting, Cuff Size: Normal)   Pulse 100   Temp 98.9 F (37.2 C)   Ht 5' 11.5" (1.816 m) Comment: height measured without shoes  Wt 219 lb 4 oz (99.5 kg)   BMI 30.15 kg/m    General: Well-appearing  Eyes: sclera anicteric, no redness  ENT: oral mucosa moist without lesions, no cervical or  supraclavicular lymphadenopathy  CV: RRR without murmur, S1/S2, no JVD, no peripheral edema  Resp: clear to auscultation bilaterally, normal RR and effort noted  GI: soft, no tenderness, with active bowel sounds. No guarding or palpable organomegaly noted.  Skin; warm and dry, no rash or jaundice noted  Neuro: awake, alert and oriented x 3. Normal gross motor function and fluent speech Rectal: Normal external, no fissure, no tenderness or palpable internal lesion. Labs:  CBC Latest Ref Rng & Units 08/25/2019 09/26/2014 09/26/2014  WBC 4.0 - 10.5 K/uL 5.6 - 6.6  Hemoglobin 13.0 - 17.0 g/dL 16.0 14.6 14.1  Hematocrit 39.0 - 52.0 % 47.3 43.0 41.3  Platelets 150.0 - 400.0 K/uL 234.0 - 133(L)     Assessment: Encounter Diagnoses  Name Primary?  . Rectal bleeding Yes  . Preprocedural examination     Possible hemorrhoidal bleeding since it is painless, but is not of chronic constipation to be the usual trigger.  Plan:  Colonoscopy recommended.  He was agreeable after discussion of procedure and risks.  The benefits and risks of the planned procedure were described in detail with the patient or (when appropriate) their health care proxy.  Risks were outlined as including, but not limited to, bleeding, infection, perforation, adverse medication reaction leading to cardiac or pulmonary decompensation, pancreatitis (if ERCP).  The limitation of incomplete mucosal visualization was also discussed.  No guarantees or warranties were given.   Thank you for the courtesy of this consult.  Please call me with any questions or concerns.  Nelida Meuse III  CC: Referring provider noted above

## 2019-08-30 ENCOUNTER — Other Ambulatory Visit: Payer: Self-pay

## 2019-08-30 ENCOUNTER — Ambulatory Visit (INDEPENDENT_AMBULATORY_CARE_PROVIDER_SITE_OTHER): Payer: 59 | Admitting: Family Medicine

## 2019-08-30 ENCOUNTER — Encounter: Payer: Self-pay | Admitting: Family Medicine

## 2019-08-30 DIAGNOSIS — R454 Irritability and anger: Secondary | ICD-10-CM | POA: Diagnosis not present

## 2019-08-30 MED ORDER — FLUOXETINE HCL 10 MG PO TABS: 10.0000 mg | ORAL_TABLET | Freq: Every day | ORAL | 6 refills | Status: DC

## 2019-08-30 MED ORDER — NA SULFATE-K SULFATE-MG SULF 17.5-3.13-1.6 GM/177ML PO SOLN: ORAL | 0 refills | Status: AC

## 2019-08-30 MED ORDER — ARIPIPRAZOLE 10 MG PO TABS: 10.0000 mg | ORAL_TABLET | Freq: Every day | ORAL | 1 refills | Status: DC

## 2019-08-30 NOTE — Assessment & Plan Note (Signed)
History concerning for possible bipolar 2 disorder.  Does not currently have any manic episodes and has no prior manic episodes.  Given his intermittent outbursts of anger we will start low-dose Prozac 10 mg daily.  Also place referral to psychiatry for further evaluation and management.  Discussed reasons to return to care.

## 2019-08-30 NOTE — Progress Notes (Signed)
   Jonathan Strong is a 34 y.o. male who presents today for a virtual office visit.  Assessment/Plan:  Chronic Problems Addressed Today: Anger History concerning for possible bipolar 2 disorder.  Does not currently have any manic episodes and has no prior manic episodes.  Given his intermittent outbursts of anger we will start low-dose Prozac 10 mg daily.  Also place referral to psychiatry for further evaluation and management.  Discussed reasons to return to care.      Subjective:  HPI:  Patient has had intermittent issues with intermittent explosions of rage for his entire life.  He has a strong family history of mental illness.  Father was diagnosed with schizophrenia.  He has multiple brothers with mental illness including bipolar disorder.  Recently his explosions have anger and rage have caused his relationship with his wife to be strained.  He will come enraged over seemingly innocuous stimuli.  He feels guilty afterwards.  Had a difficult childhood and had to use a relationship with his grandfather.  He has had intermittent episodes of feeling like he is on top of the world but has never engaged in any risky behaviors such as gambling or risky sexual activities.  No reported SI or HI.        Objective/Observations  Physical Exam: Gen: NAD, resting comfortably Pulm: Normal work of breathing Neuro: Grossly normal, moves all extremities Psych: Normal affect and thought content  Virtual Visit via Video   I connected with Jonathan Strong on 08/30/19 at  3:40 PM EST by a video enabled telemedicine application and verified that I am speaking with the correct person using two identifiers. The limitations of evaluation and management by telemedicine and the availability of in person appointments were discussed. The patient expressed understanding and agreed to proceed.   Patient location: Home Provider location: [facility] Persons participating in the  virtual visit: Myself and Patient     [name]. [name], MD 08/30/2019 11:04 AM

## 2019-09-12 ENCOUNTER — Ambulatory Visit (INDEPENDENT_AMBULATORY_CARE_PROVIDER_SITE_OTHER): Payer: 59

## 2019-09-12 ENCOUNTER — Other Ambulatory Visit: Payer: Self-pay | Admitting: Gastroenterology

## 2019-09-12 DIAGNOSIS — Z1159 Encounter for screening for other viral diseases: Secondary | ICD-10-CM

## 2019-09-13 LAB — SARS CORONAVIRUS 2 (TAT 6-24 HRS): SARS Coronavirus 2: NEGATIVE

## 2019-09-14 ENCOUNTER — Ambulatory Visit (AMBULATORY_SURGERY_CENTER): Payer: 59 | Admitting: Gastroenterology

## 2019-09-14 ENCOUNTER — Other Ambulatory Visit: Payer: Self-pay

## 2019-09-14 ENCOUNTER — Encounter: Payer: Self-pay | Admitting: Gastroenterology

## 2019-09-14 VITALS — BP 117/78 | HR 74 | Temp 97.1°F | Resp 12 | Ht 71.0 in | Wt 219.0 lb

## 2019-09-14 DIAGNOSIS — K625 Hemorrhage of anus and rectum: Secondary | ICD-10-CM | POA: Diagnosis not present

## 2019-09-14 MED ORDER — SODIUM CHLORIDE 0.9 % IV SOLN: 500.0000 mL | Freq: Once | INTRAVENOUS | Status: DC

## 2019-09-14 NOTE — Progress Notes (Signed)
Patient had  big pocket knife/ utility knife in admitting. Care partner not in waiting room. Knife at front desk.

## 2019-09-14 NOTE — Patient Instructions (Signed)
Increase dietary fiber, adequate fluid intake to maintain regularity. -Handout provided on high-fiber diet.   YOU HAD AN ENDOSCOPIC PROCEDURE TODAY AT THE [facility]:   Refer to the procedure report that was given to you for any specific questions about what was found during the examination.  If the procedure report does not answer your questions, please call your gastroenterologist to clarify.  If you requested that your care partner not be given the details of your procedure findings, then the procedure report has been included in a sealed envelope for you to review at your convenience later.  YOU SHOULD EXPECT: Some feelings of bloating in the abdomen. Passage of more gas than usual.  Walking can help get rid of the air that was put into your GI tract during the procedure and reduce the bloating. If you had a lower endoscopy (such as a colonoscopy or flexible sigmoidoscopy) you may notice spotting of blood in your stool or on the toilet paper. If you underwent a bowel prep for your procedure, you may not have a normal bowel movement for a few days.  Please Note:  You might notice some irritation and congestion in your nose or some drainage.  This is from the oxygen used during your procedure.  There is no need for concern and it should clear up in a day or so.  SYMPTOMS TO REPORT IMMEDIATELY:   Following lower endoscopy (colonoscopy or flexible sigmoidoscopy):  Excessive amounts of blood in the stool  Significant tenderness or worsening of abdominal pains  Swelling of the abdomen that is new, acute  Fever of 100F or higher  For urgent or emergent issues, a gastroenterologist can be reached at any hour by calling [phone].   DIET:  We do recommend a small meal at first, but then you may proceed to your regular diet.  Drink plenty of fluids but you should avoid alcoholic beverages for 24 hours.  ACTIVITY:  You should plan to take it easy for the rest of today and you  should NOT DRIVE or use heavy machinery until tomorrow (because of the sedation medicines used during the test).    FOLLOW UP: Our staff will call the number listed on your records 48-72 hours following your procedure to check on you and address any questions or concerns that you may have regarding the information given to you following your procedure. If we do not reach you, we will leave a message.  We will attempt to reach you two times.  During this call, we will ask if you have developed any symptoms of COVID 19. If you develop any symptoms (ie: fever, flu-like symptoms, shortness of breath, cough etc.) before then, please call [phone].  If you test positive for Covid 19 in the 2 weeks post procedure, please call and report this information to [place].    If any biopsies were taken you will be contacted by phone or by letter within the next 1-3 weeks.  Please call us at [phone] if you have not heard about the biopsies in 3 weeks.    SIGNATURES/CONFIDENTIALITY: You and/or your care partner have signed paperwork which will be entered into your electronic medical record.  These signatures attest to the fact that that the information above on your After Visit Summary has been reviewed and is understood.  Full responsibility of the confidentiality of this discharge information lies with you and/or your care-partner.

## 2019-09-14 NOTE — Progress Notes (Signed)
VS by CW. Temp by LC. 

## 2019-09-14 NOTE — Progress Notes (Signed)
To PACU, vss. Report to [email or web address].tb 

## 2019-09-14 NOTE — Op Note (Signed)
[facility] Patient Name: Jonathan Strong Procedure Date: 09/14/2019 11:57 AM MRN: [id number] Endoscopist: Sherilyn Cooter L. Myrtie Neither , MD Age: 34 Referring MD:  Date of Birth: 21-Dec-1985 Gender: Male Account #: [id number] Procedure:                Colonoscopy Indications:              Rectal bleeding (painless, self-limited) Medicines:                Monitored Anesthesia Care Procedure:                Pre-Anesthesia Assessment:                           - Prior to the procedure, a History and Physical                            was performed, and patient medications and                            allergies were reviewed. The patient's tolerance of                            previous anesthesia was also reviewed. The risks                            and benefits of the procedure and the sedation                            options and risks were discussed with the patient.                            All questions were answered, and informed consent                            was obtained. Prior Anticoagulants: The patient has                            taken no previous anticoagulant or antiplatelet                            agents. ASA Grade Assessment: II - A patient with                            mild systemic disease. After reviewing the risks                            and benefits, the patient was deemed in                            satisfactory condition to undergo the procedure.                           After obtaining informed consent, the colonoscope  was passed under direct vision. Throughout the                            procedure, the patient's blood pressure, pulse, and                            oxygen saturations were monitored continuously. The                            Colonoscope was introduced through the anus and                            advanced to the the terminal ileum, with                            identification of the  appendiceal orifice and IC                            valve. The colonoscopy was performed without                            difficulty. The patient tolerated the procedure                            well. The quality of the bowel preparation was                            fair. The terminal ileum, ileocecal valve,                            appendiceal orifice, and rectum were photographed. Scope In: 12:03:24 PM Scope Out: 12:13:26 PM Scope Withdrawal Time: 0 hours 7 minutes 52 seconds  Total Procedure Duration: 0 hours 10 minutes 2 seconds  Findings:                 The perianal and digital rectal examinations were                            normal.                           The terminal ileum appeared normal.                           The entire examined colon appeared normal on direct                            and retroflexion views. Complications:            No immediate complications. Estimated Blood Loss:     Estimated blood loss: none. Impression:               - Preparation of the colon was fair.                           - The examined portion of the ileum  was normal.                           - The entire examined colon is normal on direct and                            retroflexion views.                           - No specimens collected.                           Benign anl bleeding. Recommendation:           - Patient has a contact number available for                            emergencies. The signs and symptoms of potential                            delayed complications were discussed with the                            patient. Return to normal activities tomorrow.                            Written discharge instructions were provided to the                            patient.                           - Resume previous diet.                           - Continue present medications.                           - Repeat colonoscopy in 10 years for screening                             purposes.                           - Increase dietary fiber, adequate fluid intake to                            maintain regularity. Ady Heimann L. Myrtie Neither, MD 09/14/2019 12:17:36 PM This report has been signed electronically.

## 2019-09-16 ENCOUNTER — Telehealth: Payer: Self-pay | Admitting: *Deleted

## 2019-09-16 ENCOUNTER — Telehealth: Payer: Self-pay

## 2019-09-16 NOTE — Telephone Encounter (Signed)
2nd follow up call made.  NALM 

## 2019-09-16 NOTE — Telephone Encounter (Signed)
No answer for post procedure call back. Left message for patient and will callback later today. 

## 2019-10-20 ENCOUNTER — Ambulatory Visit (INDEPENDENT_AMBULATORY_CARE_PROVIDER_SITE_OTHER): Payer: 59 | Admitting: Licensed Clinical Social Worker

## 2019-10-20 DIAGNOSIS — F3289 Other specified depressive episodes: Secondary | ICD-10-CM
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
## 2019-10-24 ENCOUNTER — Telehealth (INDEPENDENT_AMBULATORY_CARE_PROVIDER_SITE_OTHER): Payer: 59 | Admitting: Family Medicine

## 2019-10-24 ENCOUNTER — Encounter: Payer: Self-pay | Admitting: Family Medicine

## 2019-10-24 DIAGNOSIS — R454 Irritability and anger: Secondary | ICD-10-CM | POA: Diagnosis not present

## 2019-10-24 MED ORDER — ARIPIPRAZOLE 10 MG PO TABS: 10.0000 mg | ORAL_TABLET | Freq: Every day | ORAL | 1 refills | Status: DC

## 2019-10-24 NOTE — Assessment & Plan Note (Signed)
Symptoms have improved with Prozac though is having some side effects such as blunted affect.  Will start Abilify 10 mg daily per his request.  Has not tolerated Wellbutrin in the past.  He will check in with me in a few weeks.  If symptoms persist would consider switching to alternative SSRI such as Trintellix.

## 2019-10-24 NOTE — Progress Notes (Signed)
   Jonathan Strong is a 34 y.o. male who presents today for a virtual office visit.  Assessment/Plan:  Chronic Problems Addressed Today: Anger Symptoms have improved with Prozac though is having some side effects such as blunted affect.  Will start Abilify 10 mg daily per his request.  Has not tolerated Wellbutrin in the past.  He will check in with me in a few weeks.  If symptoms persist would consider switching to alternative SSRI such as Trintellix.     Subjective:  HPI:  Patient here for follow-up for stress/anxiety/anger.  He was seen about 6 weeks ago.  Started on Prozac.  He has had significant improvement in outburst however feels much less motivated.  He thinks this is due to side effects with Prozac.  No reported SI or HI.  He would like to try Abilify.  He has not tolerated Wellbutrin in the past.       Objective/Observations  Physical Exam: Gen: NAD, resting comfortably Pulm: Normal work of breathing Neuro: Grossly normal, moves all extremities Psych: Normal affect and thought content  Virtual Visit via Video   I connected with Jonathan Strong on 10/24/19 at 11:00 AM EDT by a video enabled telemedicine application and verified that I am speaking with the correct person using two identifiers. The limitations of evaluation and management by telemedicine and the availability of in person appointments were discussed. The patient expressed understanding and agreed to proceed.   Patient location: Home Provider location: [place] [employer] Persons participating in the virtual visit: Myself and Patient     [name]. [name], MD 10/24/2019 10:45 AM

## 2019-11-09 ENCOUNTER — Encounter: Payer: Self-pay | Admitting: Family Medicine

## 2019-11-10 NOTE — Telephone Encounter (Signed)
Please advise 

## 2019-11-16 NOTE — Telephone Encounter (Signed)
Pt called stating he was told by Dr. [name] to take half of his dose of prozac. Pt states his prozac is prescribed in capsule form and not as a tablet. Pt asked if Dr. [name] could prescribe 5 MG tablets of prozac. Please advise.

## 2019-11-17 NOTE — Telephone Encounter (Signed)
Recommend he check with pharmacy - rx was sent in as a tablet.  It does not come in a 5mg  form. We will have to cut dose in half. If he is not able to get tablets from his pharmacy as we prescribed, recommend that he take every other day for a week, then every 3 days for a week, then stop.  . [name], MD 11/17/2019 1:37 PM

## 2019-11-17 NOTE — Telephone Encounter (Signed)
Please advise 

## 2019-11-24 ENCOUNTER — Ambulatory Visit
Admission: RE | Admit: 2019-11-24 | Discharge: 2019-11-24 | Disposition: A | Discharge status: Home / Self Care | Payer: 59 | Source: Ambulatory Visit | Attending: Sports Medicine | Admitting: Sports Medicine

## 2019-11-24 ENCOUNTER — Other Ambulatory Visit: Payer: Self-pay | Admitting: Sports Medicine

## 2019-11-24 ENCOUNTER — Other Ambulatory Visit: Payer: Self-pay

## 2019-11-24 DIAGNOSIS — M549 Dorsalgia, unspecified: Secondary | ICD-10-CM

## 2019-11-24 DIAGNOSIS — M5136 Other intervertebral disc degeneration, lumbar region: Secondary | ICD-10-CM

## 2019-12-30 ENCOUNTER — Other Ambulatory Visit: Payer: Self-pay | Admitting: Sports Medicine

## 2019-12-30 DIAGNOSIS — M549 Dorsalgia, unspecified: Secondary | ICD-10-CM

## 2019-12-30 DIAGNOSIS — M5136 Other intervertebral disc degeneration, lumbar region: Secondary | ICD-10-CM

## 2020-01-17 ENCOUNTER — Encounter: Payer: Self-pay | Admitting: Family Medicine

## 2020-01-18 ENCOUNTER — Other Ambulatory Visit: Payer: Self-pay

## 2020-01-18 DIAGNOSIS — R454 Irritability and anger: Secondary | ICD-10-CM

## 2020-01-23 ENCOUNTER — Telehealth: Payer: Self-pay | Admitting: Family Medicine
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
## 2020-01-23 NOTE — Telephone Encounter (Signed)
Pt called stating he was feeling stressed due to experiencing some deaths in the family. Pt requested to speak with Dr. [name]. Transferred pt call to CMA.

## 2020-01-24 NOTE — Telephone Encounter (Signed)
Noted. Agree with plan.

## 2020-01-26 ENCOUNTER — Ambulatory Visit (HOSPITAL_COMMUNITY)
Admission: EM | Admit: 2020-01-26 | Discharge: 2020-01-26 | Disposition: A | Discharge status: Home / Self Care | Payer: 59

## 2020-01-26 ENCOUNTER — Other Ambulatory Visit: Payer: Self-pay

## 2020-01-31 ENCOUNTER — Other Ambulatory Visit: Payer: Self-pay

## 2020-01-31 ENCOUNTER — Ambulatory Visit (INDEPENDENT_AMBULATORY_CARE_PROVIDER_SITE_OTHER): Payer: 59 | Admitting: Family Medicine

## 2020-01-31 ENCOUNTER — Encounter: Payer: Self-pay | Admitting: Family Medicine

## 2020-01-31 DIAGNOSIS — R454 Irritability and anger: Secondary | ICD-10-CM

## 2020-01-31 MED ORDER — CITALOPRAM HYDROBROMIDE 10 MG PO TABS: 5.0000 mg | ORAL_TABLET | Freq: Every day | ORAL | 5 refills | Status: DC

## 2020-01-31 MED ORDER — DIAZEPAM 10 MG PO TABS: 10.0000 mg | ORAL_TABLET | Freq: Three times a day (TID) | ORAL | 1 refills | Status: DC | PRN

## 2020-01-31 NOTE — Progress Notes (Signed)
   Jonathan Strong is a 34 y.o. male who presents today for an office visit.  Assessment/Plan:  Chronic Problems Addressed Today: Anger Worsened.  Did not tolerate Prozac or Abilify.  Had extensive discussion with patient and his wife regarding treatment options.  Will start very low-dose Celexa 5 mg daily and he will check in with me in a couple of weeks.  He will be going out of town in 2 weeks and he is hesitant to start any new medications while out of town.  Also refill Valium.  He will be seeing a therapist later this month as well.  We will increase dose of Celexa as tolerated.  Would consider trial of Lexapro if does not tolerate Celexa.    Subjective:  HPI:  Patient here with worsening anxiety and depression.  He was last seen a few months ago.  We had tried a couple medications including Prozac and Abilify.  Prozac made his symptoms much worse.  Abilify made him feel like a zombie.  He has had much worsening symptoms lately.  He has had outbursts of anger and has destroyed home property.  He has had occasional feelings of wanting to shoot himself.  No current reported SI or HI.      Objective:  Physical Exam: BP 108/71   Pulse 76   Temp 98 F (36.7 C)   Ht 5\' 11"  (1.803 m)   Wt 213 lb (96.6 kg)   SpO2 98%   BMI 29.71 kg/m   Gen: No acute distress, resting comfortably Psych: Depressed affect.  Time Spent: 43 minutes of total time was spent on the date of the encounter performing the following actions: chart review prior to seeing the patient, obtaining history, performing a medically necessary exam, counseling on the treatment plan including trial of alternative SSRI, placing orders, and documenting in our EHR.        . [name], MD 01/31/2020 1:42 PM

## 2020-01-31 NOTE — Assessment & Plan Note (Signed)
Worsened.  Did not tolerate Prozac or Abilify.  Had extensive discussion with patient and his wife regarding treatment options.  Will start very low-dose Celexa 5 mg daily and he will check in with me in a couple of weeks.  He will be going out of town in 2 weeks and he is hesitant to start any new medications while out of town.  Also refill Valium.  He will be seeing a therapist later this month as well.  We will increase dose of Celexa as tolerated.  Would consider trial of Lexapro if does not tolerate Celexa.

## 2020-01-31 NOTE — Patient Instructions (Addendum)
It was very nice to see you today!  Please start celexa 5mg  daily  Check in with me in a week or 2 to let me know how you are doing.  I will also refill your valium today.  Take care, Dr  Please try these tips to maintain a healthy lifestyle:   Eat at least 3 REAL meals and 1-2 snacks per day.  Aim for no more than 5 hours between eating.  If you eat breakfast, please do so within one hour of getting up.    Each meal should contain half fruits/vegetables, one quarter protein, and one quarter carbs (no bigger than a computer mouse)   Cut down on sweet beverages. This includes juice, soda, and sweet tea.     Drink at least 1 glass of water with each meal and aim for at least 8 glasses per day   Exercise at least 150 minutes every week.

## 2020-02-13 ENCOUNTER — Other Ambulatory Visit: Payer: 59

## 2020-02-14 ENCOUNTER — Encounter: Payer: Self-pay | Admitting: Family Medicine

## 2020-02-15 ENCOUNTER — Encounter (HOSPITAL_COMMUNITY): Payer: Self-pay | Admitting: Licensed Clinical Social Worker

## 2020-02-15 ENCOUNTER — Other Ambulatory Visit: Payer: Self-pay

## 2020-02-15 ENCOUNTER — Ambulatory Visit (INDEPENDENT_AMBULATORY_CARE_PROVIDER_SITE_OTHER): Payer: 59 | Admitting: Licensed Clinical Social Worker

## 2020-02-15 DIAGNOSIS — F411 Generalized anxiety disorder: Secondary | ICD-10-CM

## 2020-02-15 DIAGNOSIS — F332 Major depressive disorder, recurrent severe without psychotic features: Secondary | ICD-10-CM | POA: Diagnosis not present
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
## 2020-02-22 ENCOUNTER — Telehealth (HOSPITAL_COMMUNITY): Payer: Self-pay | Admitting: Psychiatry

## 2020-03-05 ENCOUNTER — Encounter: Payer: Self-pay | Admitting: Family Medicine

## 2020-03-07 ENCOUNTER — Other Ambulatory Visit: Payer: Self-pay

## 2020-03-14 ENCOUNTER — Other Ambulatory Visit: Payer: Self-pay

## 2020-03-14 ENCOUNTER — Ambulatory Visit (HOSPITAL_COMMUNITY): Payer: 59 | Admitting: Licensed Clinical Social Worker

## 2020-03-14 DIAGNOSIS — F332 Major depressive disorder, recurrent severe without psychotic features: Secondary | ICD-10-CM

## 2020-03-14 DIAGNOSIS — F411 Generalized anxiety disorder: Secondary | ICD-10-CM

## 2020-03-14 NOTE — Progress Notes (Signed)
THERAPIST PROGRESS NOTE  Session Time: 83  Virtual Visit via Telephone Note  I connected with Jonathan Strong on 03/14/20 at  9:00 AM EDT by telephone and verified that I am speaking with the correct person using two identifiers.  Location: Patient: [name]  Provider: Kizzie Fantasia    I discussed the limitations, risks, security and privacy concerns of performing an evaluation and management service by telephone and the availability of in person appointments. I also discussed with the patient that there may be a patient responsible charge related to this service. The patient expressed understanding and agreed to proceed.   Therapist Response:     Subjective/Objective:  Pt was alert and oriented x 5. He presented with flat and depressed mood/affect. He was well engaged in session with no eye contact as pt therapy session was conducted via phone.   Primary stressor for pt has been work with secondary stressors for transitional. Pt is currently switching jobs. 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 However, the company that he order them from will not take them back. Customer has asked that pt take the losses on himself instead of providing payment. This has frustrated pt as he reports she has not been willing to work with pt to rectify the situation. Pt reports that he provided this order for customer at cost for him and was not making any profit from the job and will not be taking a loss on it. He reports he will not be doing anymore discount for friends and family in the futures.   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 He is getting paid a salary from that  job with no commission benefits. This has given pt a little relief, however he has been living off his credit cards for the past three months and is behind on bills. 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 He has recently qualified for food stamps which did offer some benefits to himself and family.     Assessment/Plan:  Pt does endorse symptoms for depression, sadness, irritability, tension, and worry. He does meet criteria for MDD and GAD. Pt will continue to follow up with medications provided by his PCP. Plan will be to start journaling things that upset pt with notebook provided to him by his spouse, he will then organize this in order to reflect on things that most severely upset him to least affect him.      I discussed the assessment and treatment plan with the patient. The patient was provided an opportunity to ask questions and all were answered. The patient agreed with the plan and demonstrated an understanding of the instructions.   The patient was advised to call back or seek an in-person evaluation if the symptoms worsen or if the condition fails to improve as anticipated.  I provided 45 minutes of non-face-to-face time during this encounter.   Jonathan Cooks, Jonathan Strong     Participation Level: Active  Behavioral Response: NAAlertDepressed  Type of Therapy: Individual Therapy  Treatment Goals addressed: Diagnosis: Depression  Interventions: CBT and Supportive  Summary: Jonathan Strong is a 34 y.o. male who presents with MDD.   Suicidal/Homicidal: Nowithout intent/plan   Plan: Return again in 03/28/2020 weeks.  Diagnosis: Axis I: Major Depression, Recurrent severe      Jonathan Cooks, Jonathan Strong 03/14/2020

## 2020-03-28 ENCOUNTER — Other Ambulatory Visit: Payer: Self-pay

## 2020-03-28 ENCOUNTER — Ambulatory Visit (INDEPENDENT_AMBULATORY_CARE_PROVIDER_SITE_OTHER): Payer: 59 | Admitting: Licensed Clinical Social Worker

## 2020-03-28 DIAGNOSIS — F3341 Major depressive disorder, recurrent, in partial remission: Secondary | ICD-10-CM | POA: Diagnosis not present

## 2020-03-28 NOTE — Progress Notes (Signed)
   THERAPIST PROGRESS NOTE  Session Time: 50  Virtual Visit via Telephone Note  I connected with Jonathan Strong on 03/28/20 at  9:00 AM EDT by telephone and verified that I am speaking with the correct person using two identifiers.  Location: Patient: [facility]  Provider: [facility]    I discussed the limitations, risks, security and privacy concerns of performing an evaluation and management service by telephone and the availability of in person appointments. I also discussed with the patient that there may be a patient responsible charge related to this service. The patient expressed understanding and agreed to proceed.   Therapist Response:   Subjective/objective: Pt was alert and oriented x 5. He was not observed since therapy session was conducted by phone. Jonathan "[name]" had a euthymic mood/affect.   Pt reports that things have been going well overall. He even reports that his homework to journal things that made him irritable was hard to do because nothing has made him irritable in the past few weeks. Jonathan "Toddy" states that "Things have been going really well I have side jobs going well and my full-time job is a steady income". He also reports that his wife will be starting work here in the next few months now that both the children are of school age.   Pt has reported improvement with motivation stating he has been starting to cook again. He will even go into the store now and go through the different section thinking of new things he can make. He also reports that he has picked up the ucalaly and his daughter has started playing with him as well few times per week. Spouse has been giving pt chores to do with dates listed next to him which pt reports has been very helpful to keep him motivated.    Assessment/plan: Pt endorses improvement of symptoms for sadness, irritability, tension, and worry. Pt currently meets criteria for MDD in partial remission. He has  been taking medication as prescribed by his PCP. Pt to start to journal both good and bad parts throughout his day, he will also start to play the ucalaly and play that 2 times per week. Pt is agreeable to go down to monthly therapy session from biweekly.    I discussed the assessment and treatment plan with the patient. The patient was provided an opportunity to ask questions and all were answered. The patient agreed with the plan and demonstrated an understanding of the instructions.   The patient was advised to call back or seek an in-person evaluation if the symptoms worsen or if the condition fails to improve as anticipated.  I provided 60 minutes of non-face-to-face time during this encounter.   Jonathan Cooks, LCSW   Participation Level: Active  Behavioral Response: NAAlertEuthymic  Type of Therapy: Individual Therapy  Treatment Goals addressed: Diagnosis: depression  Interventions: CBT and Supportive  Summary: Jonathan Strong is a 34 y.o. male who presents with MDD.   Suicidal/Homicidal: Nowithout intent/plan   Plan: Return again in 2 weeks.     Jonathan Cooks, LCSW 03/28/2020

## 2020-04-11 ENCOUNTER — Ambulatory Visit (HOSPITAL_COMMUNITY): Payer: Self-pay | Admitting: Licensed Clinical Social Worker

## 2020-04-11 ENCOUNTER — Encounter: Payer: Self-pay | Admitting: Family Medicine

## 2020-04-12 ENCOUNTER — Encounter: Payer: Self-pay | Admitting: Family Medicine

## 2020-04-12 ENCOUNTER — Other Ambulatory Visit: Payer: Self-pay

## 2020-04-12 ENCOUNTER — Telehealth (INDEPENDENT_AMBULATORY_CARE_PROVIDER_SITE_OTHER): Payer: 59 | Admitting: Family Medicine

## 2020-04-12 ENCOUNTER — Telehealth: Payer: Self-pay | Admitting: *Deleted

## 2020-04-12 VITALS — Wt 210.0 lb

## 2020-04-12 DIAGNOSIS — R209 Unspecified disturbances of skin sensation: Secondary | ICD-10-CM | POA: Diagnosis not present

## 2020-04-12 DIAGNOSIS — M2540 Effusion, unspecified joint: Secondary | ICD-10-CM

## 2020-04-12 MED ORDER — TRIAMCINOLONE ACETONIDE 0.1 % EX CREA
1.0000 | TOPICAL_CREAM | Freq: Two times a day (BID) | CUTANEOUS | 0 refills | Status: DC
Start: 2020-04-12 — End: 2020-05-21

## 2020-04-12 NOTE — Progress Notes (Signed)
Virtual Visit via Telephone Note  I connected with Jonathan Strong on 04/12/20 at 12:40 PM EDT by telephone and verified that I am speaking with the correct person using two identifiers.   I discussed the limitations, risks, security and privacy concerns of performing an evaluation and management service by telephone and the availability of in person appointments. I also discussed with the patient that there may be a patient responsible charge related to this service. The patient expressed understanding and agreed to proceed.  Location patient: home Location provider: work or home office Participants present for the call: patient, provider Patient did not have a visit in the prior 7 days to address this/these issue(s).   History of Present Illness:   Acute visit several issues:  Skin problem: -started on the 9/5 after wearing a new tshirt he got at a concert without washing it -symptoms: irritated burning sensation in the area of the tshirt only on the R arm, only feels it in the skin, does not really notice a rash -feels fine otherwise, no fevers, chills, malaise, injury, weakness, numbness, resp symptoms, GI symptoms -has not had this issue before  Joint swelling: -has had this on and off chronically -only occurs after eating certain meats -will get swollen, painful, hot joint for a few days - usually first toe or a digit -happened the other day when ate cold cuts -usually resolves with asa and time -wants to check for gout   Observations/Objective: Patient sounds cheerful and well on the phone. I do not appreciate any SOB. Speech and thought processing are grossly intact. Patient reported vitals:  Assessment and Plan:  Skin sensation disturbance -query reaction to something in the unwashed shirt vs paresthesia vs other -opted to do trial empiric treatment with topical triam cream and oral antihistamine with PCP follow up advise in 1-2 weeks, sooner if worsening or  note resolving  Swollen joint -query gout vs other given chronic intermittent joint swelling when eats cold cuts and certain meats -advise a uric acid test a few weeks after a episode could help to dx -opted for nsaids acutely -follow up with PCP in 1-2 weeks  -we discussed possible serious and likely etiologies, options for evaluation and workup, limitations of telemedicine visit vs in person visit, treatment, treatment risks and precautions. Pt prefers to treat via telemedicine empirically today. See above for summary of plan for each issue Agrees to scheduled follow up and message sent to PCP office to assist. Advised to seek prompt follow up telemedicine visit or in person care if worsening, new symptoms arise, or if is not improving with treatment.  Follow Up Instructions:   I did not refer this patient for an OV in the next 24 hours for this/these issue(s).  I discussed the assessment and treatment plan with the patient. The patient was provided an opportunity to ask questions and all were answered. The patient agreed with the plan and demonstrated an understanding of the instructions.   I provided 18 minutes of non-face-to-face time during this encounter.   Terressa Koyanagi, DO

## 2020-04-12 NOTE — Telephone Encounter (Signed)
Called patient pt stated possible reaction to new T-shirt no redness or rash, feeling like someone is puling his under arm hairs going on for one week without changes  Advise to schedule appointments with PCP. Pt requesting virtual visit. Call transfer to front office for appointment

## 2020-04-12 NOTE — Patient Instructions (Signed)
-  small amount topical steroid cream (sent to pharmacy) to skin twice daily for 1-2 weeks in the area of skin issuee  -antihistamine such as allegra once daily for 1 week  -can try aleve or ibuprofen for the joint issue and follow up with primary care doctor   -schedule follow up with your primary care doctor in 1-2 weeks  I hope you are feeling better soon! Seek care sooner if your symptoms worsen, new concerns arise or you are not improving with treatment.

## 2020-04-18 ENCOUNTER — Telehealth: Payer: Self-pay | Admitting: *Deleted

## 2020-04-18 NOTE — Telephone Encounter (Signed)
Please reference patient message on 03/05/20.   Looks like dose was changed to 10mg  daily.    RX needs to be updated to reflect change.  Current script states 0.5 mg daily.    [facility] [address]    Patient is currently out of this medication.   Please follow up with patient once script has been sent to pharmacy.

## 2020-04-18 NOTE — Telephone Encounter (Signed)
Patient requesting medication change Form 5mg  daily to 10mg  daily   LAST APPOINTMENT DATE: 01/31/2020   NEXT APPOINTMENT DATE: Visit date not found   Rx Celexa 10mg  daily   LAST REFILL:01/31/2020  QTY:30 5 Ref

## 2020-04-19 MED ORDER — CITALOPRAM HYDROBROMIDE 10 MG PO TABS: 10.0000 mg | ORAL_TABLET | Freq: Every day | ORAL | 5 refills | Status: DC

## 2020-04-19 NOTE — Telephone Encounter (Signed)
Rx sent to pharmacy for Celexa 10 mg daily.

## 2020-04-23 ENCOUNTER — Telehealth: Payer: Self-pay

## 2020-04-23 NOTE — Telephone Encounter (Signed)
I have spoken with both the pt and the pharmacy to make them aware that the rx refill for Celexa 10 mg tab has sent to the pharmacy on 04/19/2020. Pt's pharmacy says that the rx was 2 days early per pt's insurance. Pt says that he is completely out due to the script being sent in, in error. Pt says that he will go crazy if rx refill has not been sent in correctly. Pharmacy will refill medication for pt. Pt is aware.

## 2020-04-23 NOTE — Telephone Encounter (Signed)
Pt states his instructions on his Celexa have been wrong, so therefore he wasn't able to get enough of the medication. Pt states he needs this take care of today, as this has been a problem for quite some time.

## 2020-04-25 ENCOUNTER — Other Ambulatory Visit: Payer: Self-pay

## 2020-04-25 ENCOUNTER — Ambulatory Visit (INDEPENDENT_AMBULATORY_CARE_PROVIDER_SITE_OTHER): Payer: 59 | Admitting: Licensed Clinical Social Worker

## 2020-04-25 DIAGNOSIS — F411 Generalized anxiety disorder: Secondary | ICD-10-CM

## 2020-04-25 DIAGNOSIS — F3341 Major depressive disorder, recurrent, in partial remission: Secondary | ICD-10-CM

## 2020-04-25 NOTE — Progress Notes (Signed)
   THERAPIST PROGRESS NOTE  Virtual Visit via Telephone Note  I connected with Jonathan Strong on 04/25/20 at  9:00 AM EDT by telephone and verified that I am speaking with the correct person using two identifiers.  Location: Patient: [facility]  Provider: [facility]   I discussed the limitations, risks, security and privacy concerns of performing an evaluation and management service by telephone and the availability of in person appointments. I also discussed with the patient that there may be a patient responsible charge related to this service. The patient expressed understanding and agreed to proceed.   Therapist Response:     Subjective/Objective: Pt was alert and oriented x 5. He was not observed as session for f/u therapy was conducted via telephone. Jonathan "[name]" states today he is very irritable due to relationship conflicts. He states that he depression symptoms have been improving now that he has stable employment. His spouse is a full time stay at home mom. Jonathan Strong main concern is her sleeping patterns and inability to wake up in the mornings. She stays up until two or three in the morning even though the kids need to be up for school, and he needs to go to work. She will wake up and help up but pt states she is very irritable about this and it causes conflict in their relationship. He reports they are both not good with criticism of one another. Therefore, he has not tried to talk to her about it as he fears this will cause further conflict.  Plan:  LCSW asked pt to write down three things she can improve on and then ask her what three things he can improve on. This will keep things on equal terms with the goal of creating an open line of communication. Jonathan Strong is agreeable to this plan.   Assessment:  Pt endorses symptom for tension, worry, and irritability. He has been taking medication prescribed to him by his PCP. Currently he meets criteria for GAD. Plan  moving forward is documented above. He will f/u with LCSW in 4 weeks.   I discussed the assessment and treatment plan with the patient. The patient was provided an opportunity to ask questions and all were answered. The patient agreed with the plan and demonstrated an understanding of the instructions.   The patient was advised to call back or seek an in-person evaluation if the symptoms worsen or if the condition fails to improve as anticipated.  I provided 45 minutes of non-face-to-face time during this encounter.   Weber Cooks, LCSW   Participation Level: Active  Behavioral Response: CasualAlertIrritable  Type of Therapy: Individual Therapy  Treatment Goals addressed: Diagnosis: MDD   Interventions: CBT, Solution Focused and Supportive  Summary: Jonathan Strong is a 34 y.o. male who presents with MDD and GAD.   Suicidal/Homicidal: Nowithout intent/plan  Plan: Return again in 4 weeks.     Weber Cooks, LCSW 04/25/2020

## 2020-05-11 ENCOUNTER — Ambulatory Visit (HOSPITAL_COMMUNITY): Payer: 59 | Admitting: Licensed Clinical Social Worker

## 2020-05-21 ENCOUNTER — Other Ambulatory Visit: Payer: Self-pay | Admitting: Family Medicine

## 2020-05-24 ENCOUNTER — Ambulatory Visit (INDEPENDENT_AMBULATORY_CARE_PROVIDER_SITE_OTHER): Payer: 59 | Admitting: Licensed Clinical Social Worker

## 2020-05-24 ENCOUNTER — Encounter (HOSPITAL_COMMUNITY): Payer: Self-pay | Admitting: Licensed Clinical Social Worker

## 2020-05-24 ENCOUNTER — Other Ambulatory Visit: Payer: Self-pay

## 2020-05-24 DIAGNOSIS — F325 Major depressive disorder, single episode, in full remission: Secondary | ICD-10-CM | POA: Diagnosis not present

## 2020-05-24 DIAGNOSIS — F411 Generalized anxiety disorder: Secondary | ICD-10-CM | POA: Insufficient documentation

## 2020-05-24 NOTE — Progress Notes (Signed)
   THERAPIST PROGRESS NOTE  Virtual Visit via Telephone Note  I connected with Jonathan Strong on 05/24/20 at 11:00 AM EDT by telephone and verified that I am speaking with the correct person using two identifiers.  Location: Patient: Pt home  Provider: [facility]    I discussed the limitations, risks, security and privacy concerns of performing an evaluation and management service by telephone and the availability of in person appointments. I also discussed with the patient that there may be a patient responsible charge related to this service. The patient expressed understanding and agreed to proceed.   Therapist Response:    Subjective/Objective: Pt was alert and oriented x 5. He was not observed as therapy session was conducted via phone. He presented today with euthymic mood/affect.   Primary stressor for pt this month was obtaining access to his medications. PCP is the prescribing provider and pt states that his dosage was increased because of this the prescription that Dr. [name] wrote for 6 months was running out quicker than originally anticipated. [name] states that his provider was having trouble understanding what he was asking, and he ran out of his medication for several days because of it. This was solved by pt this past Monday.   Overall pt reports feeling good. He scored a 0 on the PHQ-9 and a 2 n GAD-7. This is a major improvement in the last 3 months as pt score a 15 on the GAD-7 3 months ago. He continues to report a decrease in depression and anxiety symptoms if he stays on his medication regiment. Pt has stated his communication has been better in the last 3 months and pt continues to have increased interactions with family, friends, and neighbors.  He has been utilizing coping mechanism by playing his ukulele and will journal when needed. This intervention was provided by LCSW by utilizing CBT and solution focused therapy.    Assessment/Plan: Pt endorses a  decrease in symptoms for depression for feelings of worthlessness, hopelessness, and poor concentration. He does endorse symptoms for tension and worry. He does meet criteria for GAD and MDD in remission. Plan moving forward decrease therapy session from 1 x monthly to 1 x every other month. Pt will continue to utilize coping skills provided.    I discussed the assessment and treatment plan with the patient. The patient was provided an opportunity to ask questions and all were answered. The patient agreed with the plan and demonstrated an understanding of the instructions.   The patient was advised to call back or seek an in-person evaluation if the symptoms worsen or if the condition fails to improve as anticipated.  I provided 30 minutes of non-face-to-face time during this encounter.   Weber Cooks, LCSW    Participation Level: Active  Behavioral Response: NAAlertEuthymic  Type of Therapy: Individual Therapy  Treatment Goals addressed: Anxiety  Interventions: Supportive  Summary: Petros Ahart is a 34 y.o. male who presents with GAD.   Suicidal/Homicidal: Nowithout intent/plan   Plan: Return again in 8 weeks.      Weber Cooks, LCSW 05/24/2020

## 2020-06-20 ENCOUNTER — Ambulatory Visit (HOSPITAL_COMMUNITY): Payer: 59 | Admitting: Licensed Clinical Social Worker

## 2020-07-10 ENCOUNTER — Other Ambulatory Visit: Payer: Self-pay

## 2020-07-10 ENCOUNTER — Ambulatory Visit (INDEPENDENT_AMBULATORY_CARE_PROVIDER_SITE_OTHER): Payer: 59 | Admitting: Licensed Clinical Social Worker

## 2020-07-10 DIAGNOSIS — F411 Generalized anxiety disorder: Secondary | ICD-10-CM | POA: Diagnosis not present

## 2020-07-10 DIAGNOSIS — F325 Major depressive disorder, single episode, in full remission: Secondary | ICD-10-CM

## 2020-07-10 NOTE — Progress Notes (Addendum)
   THERAPIST PROGRESS NOTE  Virtual Visit via Video Note  I connected with [name] on 07/10/20 at  8:00 AM EST by a video enabled telemedicine application and verified that I am speaking with the correct person using two identifiers.  Location: Patient: [facility] Provider: [facility]    I discussed the limitations of evaluation and management by telemedicine and the availability of in person appointments. The patient expressed understanding and agreed to proceed.  Session Time: 3  Therapist Response:    Subjective/Objective:  Pt was alert and oriented x 5. He was not observed as pt does not have access to video. He presented today with euthymic mood/affect. Pt was cooperative and engaged well throughout therapy session.   Pt primary stressor is work. 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 A customer does not like the colors of the cabinets because different cabinets have different colors because of the way the lights hit them. Both were painted the exact same time with the same color but where the cabinets are placed the light from the sun hits them differently during the day and makes them look different. The customer wants money back but pt has already done the job at cost for the customer. Plan for pt is finish up the job and then figure out steps to satisfy the customer.   Pt states he is a very good place compared to where he was. He asked LCSW if he could be seen on an as needed basis, which had been discuss in prior session. Pt feels he is good spot with his medications and overall likes where his mental health is at. LCSW is agreeable to see pt on as needed basis.    Assessment/Plan:  Pt endorses improvement in tension, worry and overall depression symptoms. He is taking all his medications prescribed to him by his PCP for his mental health. Pt has achieved goals for Tx plan.      I  discussed the assessment and treatment plan with the patient. The patient was provided an opportunity to ask questions and all were answered. The patient agreed with the plan and demonstrated an understanding of the instructions.   The patient was advised to call back or seek an in-person evaluation if the symptoms worsen or if the condition fails to improve as anticipated.  I provided 50 minutes of non-face-to-face time during this encounter.   Weber Cooks, LCSW   Participation Level: Active  Behavioral Response: NAAlertEuthymic  Type of Therapy: Individual Therapy  Treatment Goals addressed: Diagnosis: Major depression and GAD   Interventions: CBT and Supportive  Summary: Baraka Klatt is a 34 y.o. male who presents with Major depression and GAD.   Suicidal/Homicidal: NAwithout intent/plan   Plan: Return again in 8 weeks.      Weber Cooks, LCSW 07/10/2020

## 2020-09-13 ENCOUNTER — Other Ambulatory Visit: Payer: Self-pay

## 2020-09-13 NOTE — Telephone Encounter (Signed)
MEDICATION: diazepam (VALIUM) 10 MG tablet   PHARMACY:  [id number] [place] - [address] AT [facility] [address] Phone:  [phone]  Fax:  [phone]       Comments: Patient is scheduled is for March 3.  **Let patient know to contact pharmacy at the end of the day to make sure medication is ready. **  ** Please notify patient to allow 48-72 hours to process**  **Encourage patient to contact the pharmacy for refills or they can request refills through [facility]**

## 2020-09-14 MED ORDER — DIAZEPAM 10 MG PO TABS: 10.0000 mg | ORAL_TABLET | Freq: Three times a day (TID) | ORAL | 1 refills | Status: DC | PRN

## 2020-09-26 ENCOUNTER — Ambulatory Visit (INDEPENDENT_AMBULATORY_CARE_PROVIDER_SITE_OTHER): Payer: 59 | Admitting: *Deleted

## 2020-09-26 ENCOUNTER — Other Ambulatory Visit: Payer: Self-pay

## 2020-09-26 ENCOUNTER — Ambulatory Visit (INDEPENDENT_AMBULATORY_CARE_PROVIDER_SITE_OTHER): Payer: 59 | Admitting: Family Medicine

## 2020-09-26 DIAGNOSIS — Z23 Encounter for immunization: Secondary | ICD-10-CM

## 2020-09-26 NOTE — Progress Notes (Signed)
Patient present for TDAP vaccine Given on Left deltoid, patient tolerated well

## 2020-09-26 NOTE — Progress Notes (Signed)
Patient here for tdap. I did not see or evaluate him today.   [name]. [name], MD 09/26/2020 8:13 AM

## 2020-10-09 ENCOUNTER — Other Ambulatory Visit: Payer: Self-pay | Admitting: Family Medicine

## 2020-10-09 NOTE — Telephone Encounter (Signed)
Patient has not followed up since July 2021, please advise refill.

## 2020-11-13 ENCOUNTER — Telehealth (INDEPENDENT_AMBULATORY_CARE_PROVIDER_SITE_OTHER): Payer: 59 | Admitting: Family Medicine

## 2020-11-13 ENCOUNTER — Encounter: Payer: Self-pay | Admitting: Family Medicine

## 2020-11-13 DIAGNOSIS — F325 Major depressive disorder, single episode, in full remission: Secondary | ICD-10-CM | POA: Diagnosis not present

## 2020-11-13 DIAGNOSIS — F411 Generalized anxiety disorder: Secondary | ICD-10-CM | POA: Diagnosis not present

## 2020-11-13 DIAGNOSIS — M545 Low back pain, unspecified: Secondary | ICD-10-CM

## 2020-11-13 DIAGNOSIS — G8929 Other chronic pain: Secondary | ICD-10-CM | POA: Diagnosis not present

## 2020-11-13 MED ORDER — CITALOPRAM HYDROBROMIDE 10 MG PO TABS: 20.0000 mg | ORAL_TABLET | Freq: Every day | ORAL | 5 refills | Status: DC

## 2020-11-13 MED ORDER — DIAZEPAM 10 MG PO TABS: 10.0000 mg | ORAL_TABLET | Freq: Three times a day (TID) | ORAL | 1 refills | Status: DC | PRN

## 2020-11-13 NOTE — Assessment & Plan Note (Signed)
Doing well Celexa.  Will increase dose to 20 mg daily and he will check with me in a few weeks via MyChart.

## 2020-11-13 NOTE — Assessment & Plan Note (Signed)
Stable.  Has been working on home exercises which has helped.  Will refill Valium today.  Database with no red flags.

## 2020-11-13 NOTE — Assessment & Plan Note (Signed)
Doing well on Celexa minimal side effects.  Possibly interested in increasing dose to 20 mg.  We will try this for a few weeks and he will send me a message via mychart. Will refill valium today as well.

## 2020-11-13 NOTE — Progress Notes (Signed)
   Berlyn Malina is a 35 y.o. male who presents today for a virtual office visit.  Assessment/Plan:  Chronic Problems Addressed Today: Chronic back pain, Low Back Pain Stable.  Has been working on home exercises which has helped.  Will refill Valium today.  Database with no red flags.  GAD (generalized anxiety disorder) Doing well on Celexa minimal side effects.  Possibly interested in increasing dose to 20 mg.  We will try this for a few weeks and he will send me a message via mychart. Will refill valium today as well.   Major depression in remission [facility]) Doing well Celexa.  Will increase dose to 20 mg daily and he will check with me in a few weeks via MyChart.    Subjective:  HPI:  See a/p.         Objective/Observations  Physical Exam: Gen: NAD, resting comfortably Pulm: Normal work of breathing Neuro: Grossly normal, moves all extremities Psych: Normal affect and thought content  Virtual Visit via Video   I connected with [name] on 11/13/20 at  4:00 PM EDT by a video enabled telemedicine application and verified that I am speaking with the correct person using two identifiers. The limitations of evaluation and management by telemedicine and the availability of in person appointments were discussed. The patient expressed understanding and agreed to proceed.   Patient location: Home Provider location: [place] [employer] Persons participating in the virtual visit: Myself and Patient     [name]. [name], MD 11/13/2020 11:17 AM

## 2020-11-22 ENCOUNTER — Other Ambulatory Visit: Payer: Self-pay

## 2020-11-22 ENCOUNTER — Ambulatory Visit (INDEPENDENT_AMBULATORY_CARE_PROVIDER_SITE_OTHER): Payer: 59

## 2020-11-22 ENCOUNTER — Ambulatory Visit (INDEPENDENT_AMBULATORY_CARE_PROVIDER_SITE_OTHER): Payer: 59 | Admitting: Family Medicine

## 2020-11-22 ENCOUNTER — Encounter: Payer: Self-pay | Admitting: Family Medicine

## 2020-11-22 VITALS — BP 110/70 | HR 65 | Temp 98.2°F | Ht 71.0 in | Wt 215.4 lb

## 2020-11-22 DIAGNOSIS — M545 Low back pain, unspecified: Secondary | ICD-10-CM

## 2020-11-22 DIAGNOSIS — F325 Major depressive disorder, single episode, in full remission: Secondary | ICD-10-CM

## 2020-11-22 DIAGNOSIS — M25531 Pain in right wrist: Secondary | ICD-10-CM | POA: Diagnosis not present

## 2020-11-22 DIAGNOSIS — F411 Generalized anxiety disorder: Secondary | ICD-10-CM

## 2020-11-22 MED ORDER — NAPROXEN 500 MG PO TABS: 500.0000 mg | ORAL_TABLET | Freq: Two times a day (BID) | ORAL | 0 refills | Status: DC

## 2020-11-22 NOTE — Assessment & Plan Note (Signed)
Doing well on Celexa 20 mg daily though has not noticed much of a benefit in terms of effectiveness as of yet.  No side effects.  We will continue for now and he will check with me in a few weeks via MyChart.

## 2020-11-22 NOTE — Assessment & Plan Note (Signed)
Has not noticed any side effects with Celexa 20 mg daily.  We will continue for several more weeks and he will let me know how it is working for him via Clinical cytogeneticist.

## 2020-11-22 NOTE — Patient Instructions (Signed)
It was very nice to see you today!  I think you probably have a sprain.  We will check x-rays to make sure that there are no signs of fracture.  Please take the naproxen as needed.  We will contact you once we have results back on your x-ray.  Let me know if your symptoms worsen.  Take care, Dr [name]  PLEASE NOTE:  If you had any lab tests please let us know if you have not heard back within a few days. You may see your results on mychart before we have a chance to review them but we will give you a call once they are reviewed by [place]. If we ordered any referrals today, please let us know if you have not heard from their office within the next week.   Please try these tips to maintain a healthy lifestyle:   Eat at least 3 REAL meals and 1-2 snacks per day.  Aim for no more than 5 hours between eating.  If you eat breakfast, please do so within one hour of getting up.    Each meal should contain half fruits/vegetables, one quarter protein, and one quarter carbs (no bigger than a computer mouse)   Cut down on sweet beverages. This includes juice, soda, and sweet tea.     Drink at least 1 glass of water with each meal and aim for at least 8 glasses per day   Exercise at least 150 minutes every week.

## 2020-11-22 NOTE — Progress Notes (Signed)
   Jonathan Strong is a 35 y.o. male who presents today for an office visit.  Assessment/Plan:  New/Acute Problems: Wrist Pain Reassuring exam though given mechanism of injury will check x-ray to rule out fracture.  He will continue home splinting.  Recommended ice as well.  We will start naproxen 500 mg twice daily as that naproxen seems to work well for him.  He will let me know if symptoms do not continue to improve.  Back Pain No red flags.  Possibly contusion/muscle strain or spasm given that he has had some improvement with Valium.  We will check x-rays today given his recent trauma.  Also start naproxen as above.  He will let me know if symptoms worsen.  Chronic Problems Addressed Today: GAD (generalized anxiety disorder) Has not noticed any side effects with Celexa 20 mg daily.  We will continue for several more weeks and he will let me know how it is working for him via Clinical cytogeneticist.  Major depression in remission [facility]) Doing well on Celexa 20 mg daily though has not noticed much of a benefit in terms of effectiveness as of yet.  No side effects.  We will continue for now and he will check with me in a few weeks via MyChart.     Subjective:  HPI:  Patient fell 2 days ago while rollerskating.  Went down an incline.  Fell backwards on outstretched hand.  Landed on right lower back/buttocks and right hand.  Noticed pain to both of these areas since then.  He has been taking naproxen with some improvement.  He has also been using Valium to help with muscle spasms which is helped.  Still has persistent pain in his right wrist and right lower back.  No weakness or numbness.  No reported bowel or bladder incontinence.       Objective:  Physical Exam: BP 110/70   Pulse 65   Temp 98.2 F (36.8 C)   Ht 5\' 11"  (1.803 m)   Wt 215 lb 6.1 oz (97.7 kg)   SpO2 97%   BMI 30.04 kg/m   Gen: No acute distress, resting comfortably MSK:  - Back: No deformities. Tenderness to palpation  right lower lumbar spine. Legs with full range of motion. Neurovascularly intact distally.  - Wrist: No deformities.  Neuro: Grossly normal, moves all extremities Psych: Normal affect and thought content      Pia Jedlicka M. , MD 11/22/2020 10:09 AM

## 2020-11-23 NOTE — Progress Notes (Signed)
Please inform patient of the following:  Please let patient know both his x-ray of his wrist and back are normal.  Would like for him to let us know if pain is not improving over the next week or so.

## 2020-12-14 ENCOUNTER — Other Ambulatory Visit: Payer: Self-pay

## 2020-12-14 ENCOUNTER — Ambulatory Visit (INDEPENDENT_AMBULATORY_CARE_PROVIDER_SITE_OTHER): Payer: 59 | Admitting: Family Medicine

## 2020-12-14 ENCOUNTER — Ambulatory Visit (INDEPENDENT_AMBULATORY_CARE_PROVIDER_SITE_OTHER): Payer: 59

## 2020-12-14 ENCOUNTER — Encounter: Payer: Self-pay | Admitting: Family Medicine

## 2020-12-14 VITALS — BP 110/80 | HR 65 | Temp 98.1°F | Ht 71.0 in | Wt 219.2 lb

## 2020-12-14 DIAGNOSIS — M25511 Pain in right shoulder: Secondary | ICD-10-CM | POA: Diagnosis not present

## 2020-12-14 MED ORDER — CRADLE ARM SLING MISC: 0 refills | Status: DC

## 2020-12-14 MED ORDER — TRAMADOL HCL 50 MG PO TABS: 50.0000 mg | ORAL_TABLET | Freq: Three times a day (TID) | ORAL | 0 refills | Status: AC | PRN

## 2020-12-14 MED ORDER — METHYLPREDNISOLONE ACETATE 80 MG/ML IJ SUSP
80.0000 mg | Freq: Once | INTRAMUSCULAR | Status: AC
  Administered 2020-12-14: 80 mg via INTRA_ARTICULAR

## 2020-12-14 MED ORDER — DICLOFENAC SODIUM 75 MG PO TBEC: 75.0000 mg | DELAYED_RELEASE_TABLET | Freq: Two times a day (BID) | ORAL | 0 refills | Status: DC

## 2020-12-14 NOTE — Progress Notes (Signed)
   Jonathan Strong is a 35 y.o. male who presents today for an office visit.  Assessment/Plan:  New/Acute Problems: Shoulder pain Concern for rotator cuff tear.  Radiology read of x-ray is pending.   Discussed treatment options.  We will start diclofenac.  We will send in tramadol to use as needed.    Steroid injection performed today.  See below procedure note.  He tolerated well.  Discussed reasons to return to care.     Subjective:  HPI:  Patient here with right shoulder pain.  Yesterday was throwing a baseball.  Middle of his throat noticed immediate pain to the shoulder.  Has had severe pain over the last 12 hours or so.  Tried taking naproxen without significant improvement.  It is difficult for him to lift his arm above the level of the shoulder.  No other specific treatments tried.       Objective:  Physical Exam: BP 110/80   Pulse 65   Temp 98.1 F (36.7 C)   Ht 5\' 11"  (1.803 m)   Wt 219 lb 3.2 oz (99.4 kg)   SpO2 98%   BMI 30.57 kg/m   Gen: No acute distress, resting comfortably MSK: - Right arm: No deformities.  Nontender to palpation.  Supraspinatus testing intact.  Significant pain and weakness with resisted external rotation.  Internal rotation intact.  Limited range of motion at shoulder due to pain. Neuro: Grossly normal, moves all extremities Psych: Normal affect and thought content  Shoulder Injection Procedure Note  Pre-operative Diagnosis: Shoulder pain  Post-operative Diagnosis: same  Indications: Pain Relief   Anesthesia: Topical ethyl chloride  Procedure Details   Written consent was obtained for the procedure. The shoulder was prepped with iodine and the skin was anesthetized with topical ethyl choride. Using a 25 gauge needle the subacromial space was injected with 3 mL 1% lidocaine and 1 mL of 80cc/ml depomedrol under the posterior aspect of the acromion. The injection site was cleansed with topical isopropyl alcohol and a dressing was  applied.  Complications:  None; patient tolerated the procedure well.  , MD 12/14/2020 11:22 AM      12/16/2020. [name], MD 12/14/2020 11:22 AM

## 2020-12-14 NOTE — Patient Instructions (Signed)
It was very nice to see you today!  We injected your shoulder today.  We will check an x-ray and place a referral for you to see orthopedics.  I am concerned you may have rotator cuff tear.  Please start the diclofenac.  Take the tramadol as needed.  Take care, Dr [name]  PLEASE NOTE:  If you had any lab tests please let us know if you have not heard back within a few days. You may see your results on mychart before we have a chance to review them but we will give you a call once they are reviewed by [place]. If we ordered any referrals today, please let us know if you have not heard from their office within the next week.   Please try these tips to maintain a healthy lifestyle:   Eat at least 3 REAL meals and 1-2 snacks per day.  Aim for no more than 5 hours between eating.  If you eat breakfast, please do so within one hour of getting up.    Each meal should contain half fruits/vegetables, one quarter protein, and one quarter carbs (no bigger than a computer mouse)   Cut down on sweet beverages. This includes juice, soda, and sweet tea.     Drink at least 1 glass of water with each meal and aim for at least 8 glasses per day   Exercise at least 150 minutes every week.

## 2020-12-17 NOTE — Progress Notes (Signed)
Please inform patient of the following:  Xray shows a little degenerative change. Would like for him to follow up with orthopedics as we discussed.

## 2021-01-07 ENCOUNTER — Other Ambulatory Visit: Payer: Self-pay | Admitting: Family Medicine

## 2021-01-07 MED ORDER — DICLOFENAC SODIUM 75 MG PO TBEC: 75.0000 mg | DELAYED_RELEASE_TABLET | Freq: Two times a day (BID) | ORAL | 0 refills | Status: DC

## 2021-01-22 ENCOUNTER — Telehealth: Payer: Self-pay

## 2021-01-22 MED ORDER — DIAZEPAM 10 MG PO TABS: 10.0000 mg | ORAL_TABLET | Freq: Three times a day (TID) | ORAL | 1 refills | Status: DC | PRN

## 2021-01-22 NOTE — Telephone Encounter (Signed)
.   LAST APPOINTMENT DATE: 01/07/2021   NEXT APPOINTMENT DATE:@Visit  date not found  MEDICATION:diazepam (VALIUM) 10 MG tablet  PHARMACY:[id number] - [place], [place] - [address] AT [address] OF [facility] [address]

## 2021-02-06 ENCOUNTER — Telehealth (INDEPENDENT_AMBULATORY_CARE_PROVIDER_SITE_OTHER): Payer: 59 | Admitting: Family Medicine

## 2021-02-06 VITALS — Temp 97.7°F

## 2021-02-06 DIAGNOSIS — J039 Acute tonsillitis, unspecified: Secondary | ICD-10-CM

## 2021-02-06 MED ORDER — AMOXICILLIN-POT CLAVULANATE 875-125 MG PO TABS: 1.0000 | ORAL_TABLET | Freq: Two times a day (BID) | ORAL | 0 refills | Status: DC

## 2021-02-06 NOTE — Progress Notes (Signed)
Virtual Visit via Video Note  I connected with Jonathan Strong on 02/06/21 at 5:25 PM by a video enabled telemedicine application and verified that I am speaking with the correct person using two identifiers.  Patient location: home  My location: office   I discussed the limitations, risks, security and privacy concerns of performing an evaluation and management service by telephone and the availability of in person appointments. I also discussed with the patient that there may be a patient responsible charge related to this service. The patient expressed understanding and agreed to proceed, consent obtained  Chief complaint:  Chief Complaint  Patient presents with   Sore Throat    Pt reports starting sore throat on Sunday, feels sharp pain with swallowing in his throat, pressure in his ears, tried decongestants but no relief, feels swelling on his throat, denies known exposure to strep, denies fever, pt denies white patching in back of throat. Worse on LT than RT     History of Present Illness: Jonathan Strong is a 35 y.o. male  Sore throat: Started as sore throat on left 3-4days ago.  hurts to swallow. Pain moved up to ear on left. No hearing difficulty. Recently at swim meet, with kids, but no known sick contacts.Gradually getting worse. Worse yesterday. Tried ear drops [facility] carbamide peroxide for earwax with numbing drops - no relief. and alka seltzer. Min relief. More painful this morning. Still hurts to swallow. Having to spit out saliva. Narrowing of left side from uvula to side of throat. No fever. Able to drink fluids.  Negative rapid covid test yesterday morning.  No cough, rhinorrhea or congestion.  No strep throat contacts.     Patient Active Problem List   Diagnosis Date Noted   Major depression in remission (HCC) 05/24/2020   GAD (generalized anxiety disorder) 05/24/2020   Anger 08/30/2019   Dyslipidemia 08/26/2019   Stress 06/15/2017   Degeneration of lumbar  intervertebral disc 03/09/2017   Chronic back pain, Low Back Pain 10/13/2014   Past Medical History:  Diagnosis Date   Depression    History of chicken pox    History of mononucleosis    treated at [facility] ER 3 weeks ago per patient   Hyperlipidemia    Past Surgical History:  Procedure Laterality Date   NO PAST SURGERIES     No Known Allergies Prior to Admission medications   Medication Sig Start Date End Date Taking? Authorizing Provider  citalopram (CELEXA) 10 MG tablet Take 2 tablets (20 mg total) by mouth daily. 11/13/20  Yes [name], MD  diazepam (VALIUM) 10 MG tablet Take 1 tablet (10 mg total) by mouth every 8 (eight) hours as needed. 01/22/21  Yes [name], MD  diclofenac (VOLTAREN) 75 MG EC tablet Take 1 tablet (75 mg total) by mouth 2 (two) times daily. 01/07/21  Yes [name], MD  Elastic Bandages & Supports (CRADLE ARM [employer]) MISC Use as needed for shoulder pain. 12/14/20  Yes [name], MD   Social History   Socioeconomic History   Marital status: Married    Spouse name: Not on file   Number of children: 2   Years of education: Not on file   Highest education level: Not on file  Occupational History   Occupation: Holiday representative  Tobacco Use   Smoking status: Every Day    Packs/day: 0.10    Pack years: 0.00    Types: Cigarettes   Smokeless tobacco: Never  Vaping Use   Vaping Use: Never used  Substance and Sexual Activity   Alcohol use: Yes    Alcohol/week: 35.0 standard drinks    Types: 14 Cans of beer, 21 Shots of liquor per week   Drug use: No   Sexual activity: Yes    Partners: Female  Other Topics Concern   Not on file  Social History Narrative   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[employer], Surveyor, mining - cabinets, custom      Home Situation: lives with wife and 2 daughters - 3 yrs and 3 months in 2016      Spiritual Beliefs: none      Lifestyle: no regular exercise; diet is ok      International aid/development worker of Manufacturing engineer Strain: Low Risk    Difficulty of Paying Living Expenses: Not very hard  Food Insecurity: No Food Insecurity   Worried About Programme researcher, broadcasting/film/video in the Last Year: Never true   Barista in the Last Year: Never true  Transportation Needs: No Transportation Needs   Lack of Transportation (Medical): No   Lack of Transportation (Non-Medical): No  Physical Activity: Inactive   Days of Exercise per Week: 0 days   Minutes of Exercise per Session: 0 min  Stress: Stress Concern Present   Feeling of Stress : To some extent  Social Connections: Moderately Isolated   Frequency of Communication with Friends and Family: Twice a week   Frequency of Social Gatherings with Friends and Family: Twice a week   Attends Religious Services: Never   Database administrator or Organizations: No   Attends Engineer, structural: Never   Marital Status: Married  Catering manager Violence: Not At Risk   Fear of Current or Ex-Partner: No   Emotionally Abused: No   Physically Abused: No   Sexually Abused: No    Observations/Objective: Vitals:   02/06/21 1247  Temp: 97.7 F (36.5 C)  TempSrc: Temporal  Nontoxic appearance on video.  Speaking without stridor or distress. Uvula appears midline, left neck sore without nodules per patient's self exam.  On exam over video with use of patient's flashlight, I do see a prominent left tonsillar recess, slightly closer to the uvula but not touching and uvula midline.  He is able to swallow secretions.  He noted possible pus on the right tonsil but I am not able to see that over video, no vesicles. All questions answered with understanding plan expressed   Assessment and Plan: Tonsillitis - Plan: amoxicillin-clavulanate (AUGMENTIN) 875-125 MG tablet  -Suspected tonsillitis, possible strep.  Worsening with prominent left tonsil recess but no definitive peritonsillar abscess on video exam today.  Also less likely retropharyngeal  abscess, but both were discusse with patient.  Option of urgent care/ER eval tonight, but decided on initial Augmentin 875 mg twice daily with close monitoring for any worsening, then urgent care/ER eval.  Understanding expressed.  Symptomatic care discussed with Tylenol, sore throat lozenges.  Follow Up Instructions: ER precautions if any difficulty swallowing.    I discussed the assessment and treatment plan with the patient. The patient was provided an opportunity to ask questions and all were answered. The patient agreed with the plan and demonstrated an understanding of the instructions.   The patient was advised to call back or seek an in-person evaluation if the symptoms worsen or if the condition fails to improve as anticipated.  I provided 29 minutes of non-face-to-face time  during this encounter.   Wendie Agreste, MD

## 2021-02-06 NOTE — Patient Instructions (Addendum)
I do recommend repeat covid test tonight or tomorrow morning, but less likely cause of your symptoms.   I do see a slightly prominent area on the left tonsil but does not appear to be a definite abscess at this time.  Start Augmentin tonight, Cepacol lozenges if needed for sore throat as well as Tylenol if needed for pain or sore throat.  Make sure to drink plenty of fluids as possible.   If any worse difficulty swallowing, inability to swallow fluids, fever, or worsening swelling on that side of the throat, be seen at urgent care or ER as we discussed.  Please let me know if there are questions and I hope you feel better soon.   Tonsillitis  Tonsillitis is an infection of the throat that causes the tonsils to become red, tender, and swollen. Tonsils are tissues in the back of your throat. Each tonsil has crevices (crypts). Tonsils normally work to protect the body from infection. What are the causes? Sudden (acute) tonsillitis may be caused by a virus or bacteria, including streptococcal bacteria. Long-lasting (chronic) tonsillitis occurs when the crypts of the tonsils become filled with pieces of food and bacteria, which makes it easy for the tonsils to become repeatedlyinfected. Tonsillitis can be spread from person to person (is contagious). It may be spread by inhaling droplets that are released with coughing or sneezing. You may also come into contact with viruses or bacteria on surfaces,such as cups or utensils. What are the signs or symptoms? Symptoms of this condition include: A sore throat. This may include trouble swallowing. White patches on the tonsils. Swollen tonsils. Fever. Headache. Tiredness. Loss of appetite. Snoring during sleep when you did not snore before. Small, foul-smelling, yellowish-white pieces of material (tonsilloliths) that you occasionally cough up or spit out. These can cause you to have bad breath. How is this diagnosed? This condition is diagnosed with a  physical exam. Diagnosis can be confirmedwith the results of lab tests, including a throat culture. How is this treated? Treatment for this condition depends on the cause, but usually focuses on treating the symptoms associated with it. Treatment may include: Medicines to relieve pain and manage fever. Steroid medicines to reduce swelling. Antibiotic medicines if the condition is caused by bacteria. If attacks of tonsillitis are severe and frequent, your health care provider may recommend surgery to remove the tonsils (tonsillectomy). Follow these instructions at home: Medicines Take over-the-counter and prescription medicines only as told by your health care provider. If you were prescribed an antibiotic medicine, take it as told by your health care provider. Do not stop taking the antibiotic even if you start to feel better. Eating and drinking Drink enough fluid to keep your urine clear or pale yellow. While your throat is sore, eat soft or liquid foods, such as sherbet, soups, or instant breakfast drinks. Drink warm liquids. Eat frozen ice pops. General instructions Rest as much as possible and get plenty of sleep. Gargle with a salt-water mixture 3-4 times a day or as needed. To make a salt-water mixture, completely dissolve -1 tsp of salt in 1 cup of warm water. Do not swallow salt-water mixture. Wash your hands regularly with soap and water. If soap and water are not available, use hand sanitizer. Do not share cups, bottles, or other utensils until your symptoms have gone away. Do not smoke. This can help your symptoms and prevent the infection from coming back. If you need help quitting, ask your health care provider. Keep all follow-up  visits as told by your health care provider. This is important. Contact a health care provider if: You notice large, tender lumps in your neck that were not there before. You have a fever that does not go away after 2-3 days. You develop a  rash. You cough up a green, yellow-brown, or bloody substance. You cannot swallow liquids or food for 24 hours. Only one of your tonsils is swollen. Get help right away if: You develop any new symptoms, such as vomiting, severe headache, stiff neck, chest pain, trouble breathing, or trouble swallowing. You have severe throat pain along with drooling or voice changes. You have severe pain that is not controlled with medicines. You cannot fully open your mouth. You develop redness, swelling, or severe pain anywhere in your neck. Summary Tonsillitis is an infection of the throat that causes the tonsils to become red, tender, and swollen. Tonsillitis may be caused by a virus or bacteria. Rest as much as possible. Get plenty of sleep. Get help right away if you develop any new symptoms, such as vomiting, severe headache, stiff neck, chest pain, or trouble breathing. This information is not intended to replace advice given to you by your health care provider. Make sure you discuss any questions you have with your healthcare provider. Document Revised: 07/12/2020 Document Reviewed: 06/28/2020 Elsevier Patient Education  2022 [employer].

## 2021-02-08 ENCOUNTER — Ambulatory Visit
Admission: EM | Admit: 2021-02-08 | Discharge: 2021-02-08 | Disposition: A | Discharge status: Home / Self Care | Payer: 59 | Attending: Emergency Medicine | Admitting: Emergency Medicine

## 2021-02-08 ENCOUNTER — Other Ambulatory Visit: Payer: Self-pay

## 2021-02-08 DIAGNOSIS — J36 Peritonsillar abscess: Secondary | ICD-10-CM

## 2021-02-08 MED ORDER — IBUPROFEN 800 MG PO TABS: 800.0000 mg | ORAL_TABLET | Freq: Three times a day (TID) | ORAL | 0 refills | Status: DC

## 2021-02-08 MED ORDER — CLINDAMYCIN HCL 300 MG PO CAPS: 300.0000 mg | ORAL_CAPSULE | Freq: Three times a day (TID) | ORAL | 0 refills | Status: AC

## 2021-02-08 NOTE — Discharge Instructions (Addendum)
Begin clindamycin every 8 hours x1 week Ibuprofen and Tylenol for pain and swelling May use Flonase for any underlying fluid on ears Please continue to monitor symptoms, if developing fevers, worsening pain swelling or neck stiffness please go to emergency room

## 2021-02-08 NOTE — ED Provider Notes (Signed)
[facility]    CSN: [id number] Arrival date & time: 02/08/21  1230      History   Chief Complaint Chief Complaint  Patient presents with   Otalgia   Sore Throat    HPI Jonathan Strong is a 35 y.o. male presenting today for evaluation of sore throat and ear pain.  Reports that he has had progressively worsening left-sided sore throat over the past 5 to 6 days.  He did a virtual visit on Tuesday and was prescribed Augmentin.  He has been taking this over the past 2 days without any improvement of symptoms, feels symptoms are worsening.  Reports that he noticed his left tonsil swollen and almost touching his uvula.  Pain radiating into left ear and causing a sharp pain in his ear as well.  Denies any cough or congestion.  Denies fevers.  HPI  Past Medical History:  Diagnosis Date   Depression    History of chicken pox    History of mononucleosis    treated at [facility] ER 3 weeks ago per patient   Hyperlipidemia     Patient Active Problem List   Diagnosis Date Noted   Major depression in remission (HCC) 05/24/2020   GAD (generalized anxiety disorder) 05/24/2020   Anger 08/30/2019   Dyslipidemia 08/26/2019   Stress 06/15/2017   Degeneration of lumbar intervertebral disc 03/09/2017   Chronic back pain, Low Back Pain 10/13/2014    Past Surgical History:  Procedure Laterality Date   NO PAST SURGERIES         Home Medications    Prior to Admission medications   Medication Sig Start Date End Date Taking? Authorizing Provider  clindamycin (CLEOCIN) 300 MG capsule Take 1 capsule (300 mg total) by mouth 3 (three) times daily for 7 days. 02/08/21 02/15/21 Yes Tiburcio Linder C, PA-C  ibuprofen (ADVIL) 800 MG tablet Take 1 tablet (800 mg total) by mouth 3 (three) times daily. 02/08/21  Yes Lana Flaim C, PA-C  citalopram (CELEXA) 10 MG tablet Take 2 tablets (20 mg total) by mouth daily. 11/13/20   [name], MD  diazepam (VALIUM) 10 MG tablet Take 1 tablet  (10 mg total) by mouth every 8 (eight) hours as needed. 01/22/21   [name], MD  diclofenac (VOLTAREN) 75 MG EC tablet Take 1 tablet (75 mg total) by mouth 2 (two) times daily. 01/07/21   [name], MD  Elastic Bandages & Supports (CRADLE ARM [place]) MISC Use as needed for shoulder pain. 12/14/20   [name], MD    Family History Family History  Problem Relation Age of Onset   Mental illness Brother    Bipolar disorder Brother    Schizophrenia Father    Rectal cancer Neg Hx    Stomach cancer Neg Hx     Social History Social History   Tobacco Use   Smoking status: Every Day    Packs/day: 0.10    Types: Cigarettes   Smokeless tobacco: Never  Vaping Use   Vaping Use: Never used  Substance Use Topics   Alcohol use: Yes    Alcohol/week: 35.0 standard drinks    Types: 14 Cans of beer, 21 Shots of liquor per week   Drug use: No     Allergies   Patient has no known allergies.   Review of Systems Review of Systems  Constitutional:  Negative for activity change, appetite change, chills, fatigue and fever.  HENT:  Positive for ear  pain and sore throat. Negative for congestion, rhinorrhea, sinus pressure and trouble swallowing.   Eyes:  Negative for discharge and redness.  Respiratory:  Negative for cough, chest tightness and shortness of breath.   Cardiovascular:  Negative for chest pain.  Gastrointestinal:  Negative for abdominal pain, diarrhea, nausea and vomiting.  Musculoskeletal:  Negative for myalgias.  Skin:  Negative for rash.  Neurological:  Negative for dizziness, light-headedness and headaches.    Physical Exam Triage Vital Signs ED Triage Vitals  Enc Vitals Group     BP 02/08/21 1251 128/85     Pulse Rate 02/08/21 1251 83     Resp 02/08/21 1251 18     Temp 02/08/21 1251 98.7 F (37.1 C)     Temp Source 02/08/21 1251 Oral     SpO2 02/08/21 1251 95 %     Weight --      Height --      Head Circumference --      Peak Flow --      Pain Score  02/08/21 1253 7     Pain Loc --      Pain Edu? --      Excl. in GC? --    No data found.  Updated Vital Signs BP 128/85 (BP Location: Left Arm)   Pulse 83   Temp 98.7 F (37.1 C) (Oral)   Resp 18   SpO2 95%   Visual Acuity Right Eye Distance:   Left Eye Distance:   Bilateral Distance:    Right Eye Near:   Left Eye Near:    Bilateral Near:     Physical Exam Vitals and nursing note reviewed.  Constitutional:      Appearance: He is well-developed.     Comments: No acute distress  HENT:     Head: Normocephalic and atraumatic.     Ears:     Comments: Bilateral ears without tenderness to palpation of external auricle, tragus and mastoid, EAC's without erythema or swelling, TM's with good bony landmarks and cone of light. Non erythematous.      Nose: Nose normal.     Mouth/Throat:     Comments: Bilateral tonsils moderately enlarged, left greater than right with associated erythema, exudate noted on left, uvula midline and also appears erythematous and mildly swollen, posterior pharynx patent, no soft palate swelling, speaking in full sentences Eyes:     Conjunctiva/sclera: Conjunctivae normal.  Cardiovascular:     Rate and Rhythm: Normal rate and regular rhythm.  Pulmonary:     Effort: Pulmonary effort is normal. No respiratory distress.  Abdominal:     General: There is no distension.  Musculoskeletal:        General: Normal range of motion.     Cervical back: Neck supple.  Skin:    General: Skin is warm and dry.  Neurological:     Mental Status: He is alert and oriented to person, place, and time.     UC Treatments / Results  Labs (all labs ordered are listed, but only abnormal results are displayed) Labs Reviewed - No data to display  EKG   Radiology No results found.  Procedures Procedures (including critical care time)  Medications Ordered in UC Medications - No data to display  Initial Impression / Assessment and Plan / UC Course  I have  reviewed the triage vital signs and the nursing notes.  Pertinent labs & imaging results that were available during my care of the patient were reviewed by me  and considered in my medical decision making (see chart for details).    Exam suggestive of tonsillitis, but no obvious signs of peritonsillar abscess at this time, will switch from Augmentin to clindamycin, continue symptomatic and supportive care for possible underlying viral etiology with close monitoring.  No airway compromise at this time,Discussed strict return precautions. Patient verbalized understanding and is agreeable with plan.  Final Clinical Impressions(s) / UC Diagnoses   Final diagnoses:  Peritonsillitis     Discharge Instructions      Begin clindamycin every 8 hours x1 week Ibuprofen and Tylenol for pain and swelling May use Flonase for any underlying fluid on ears Please continue to monitor symptoms, if developing fevers, worsening pain swelling or neck stiffness please go to emergency room   ED Prescriptions     Medication Sig Dispense Auth. Provider   clindamycin (CLEOCIN) 300 MG capsule Take 1 capsule (300 mg total) by mouth 3 (three) times daily for 7 days. 21 capsule Isola Mehlman C, PA-C   ibuprofen (ADVIL) 800 MG tablet Take 1 tablet (800 mg total) by mouth 3 (three) times daily. 21 tablet Jazzmyn Filion, [place] C, PA-C      PDMP not reviewed this encounter.   Lew Dawes, [place] 02/08/21 1424

## 2021-02-08 NOTE — ED Triage Notes (Signed)
Pt presents with sore throat and left side ear pain X 5 days.

## 2021-03-29 ENCOUNTER — Other Ambulatory Visit: Payer: Self-pay

## 2021-03-29 MED ORDER — DIAZEPAM 10 MG PO TABS: 10.0000 mg | ORAL_TABLET | Freq: Three times a day (TID) | ORAL | 1 refills | Status: DC | PRN

## 2021-03-29 NOTE — Telephone Encounter (Signed)
Pt requesting refill for Valium. Last OV 12/14/20 with Dr. [name].

## 2021-03-29 NOTE — Telephone Encounter (Signed)
LAST APPOINTMENT DATE:  12/14/20  NEXT APPOINTMENT DATE: None  MEDICATION:diazepam (VALIUM) 10 MG tablet  PH[facility]ACY:[id number] - [place],  - [address] AT [address] [facility] [address]

## 2021-05-18 ENCOUNTER — Other Ambulatory Visit: Payer: Self-pay | Admitting: Family Medicine

## 2021-06-03 ENCOUNTER — Other Ambulatory Visit: Payer: Self-pay

## 2021-06-03 NOTE — Telephone Encounter (Signed)
MEDICATION: diazepam (VALIUM) 10 MG tablet  PHARMACY:  [id number] - [place], [place] - [address] AT [address] [facility] [address] Phone:  [phone]  Fax:  [phone]      Comments:   **Let patient know to contact pharmacy at the end of the day to make sure medication is ready. **  ** Please notify patient to allow 48-72 hours to process**  **Encourage patient to contact the pharmacy for refills or they can request refills through [employer]**

## 2021-06-04 MED ORDER — DIAZEPAM 10 MG PO TABS: 10.0000 mg | ORAL_TABLET | Freq: Three times a day (TID) | ORAL | 1 refills | Status: DC | PRN

## 2021-07-23 ENCOUNTER — Other Ambulatory Visit: Payer: Self-pay | Admitting: Family Medicine

## 2021-07-23 NOTE — Telephone Encounter (Signed)
Medication:  diazepam (valium) 10 MG tablet    Has the patient contacted their pharmacy? No. (If no, request that the patient contact the pharmacy for the refill.) (If yes, when and what did the pharmacy advise?)     Preferred Pharmacy (with phone number or street name):  [id number] - [place], [place] - [address] [facility] [address]  [address] [place] [address]  Phone:  [phone]  Fax:  [phone]     Agent: Please be advised that RX refills may take up to 3 business days. We ask that you follow-up with your pharmacy.

## 2021-07-24 MED ORDER — DIAZEPAM 10 MG PO TABS: 10.0000 mg | ORAL_TABLET | Freq: Three times a day (TID) | ORAL | 2 refills | Status: DC | PRN

## 2021-08-05 ENCOUNTER — Telehealth: Payer: Self-pay

## 2021-08-05 NOTE — Telephone Encounter (Signed)
Has he contacted the pharmacy? He has some refills on file - would recommend he check with them first.  [name]. [name], MD 08/05/2021 12:12 PM

## 2021-08-05 NOTE — Telephone Encounter (Signed)
Patient states he just refilled diazepam 2 weeks ago but has lost the rest of the rx.   Would like to know if Dr. [name] would be able to send another script authorized to get diazepam filled?  [facility] [address]

## 2021-08-05 NOTE — Telephone Encounter (Signed)
See note

## 2021-08-05 NOTE — Telephone Encounter (Signed)
Patient notified

## 2021-08-30 ENCOUNTER — Encounter: Payer: Self-pay | Admitting: Family Medicine

## 2021-08-30 ENCOUNTER — Other Ambulatory Visit: Payer: Self-pay

## 2021-08-30 ENCOUNTER — Ambulatory Visit (INDEPENDENT_AMBULATORY_CARE_PROVIDER_SITE_OTHER): Payer: Managed Care, Other (non HMO) | Admitting: Family Medicine

## 2021-08-30 VITALS — BP 130/88 | HR 85 | Temp 98.1°F | Ht 72.05 in | Wt 227.6 lb

## 2021-08-30 DIAGNOSIS — Z0001 Encounter for general adult medical examination with abnormal findings: Secondary | ICD-10-CM

## 2021-08-30 DIAGNOSIS — F411 Generalized anxiety disorder: Secondary | ICD-10-CM | POA: Diagnosis not present

## 2021-08-30 DIAGNOSIS — R739 Hyperglycemia, unspecified: Secondary | ICD-10-CM

## 2021-08-30 DIAGNOSIS — F325 Major depressive disorder, single episode, in full remission: Secondary | ICD-10-CM | POA: Diagnosis not present

## 2021-08-30 DIAGNOSIS — E785 Hyperlipidemia, unspecified: Secondary | ICD-10-CM

## 2021-08-30 LAB — LIPID PANEL
Cholesterol: 270 mg/dL — ABNORMAL HIGH (ref 0–200)
HDL: 43.5 mg/dL (ref >39.00)
NonHDL: 226.1
Total CHOL/HDL Ratio: 6
Triglycerides: 276 mg/dL — ABNORMAL HIGH (ref 0.0–149.0)
VLDL: 55.2 mg/dL — ABNORMAL HIGH (ref 0.0–40.0)

## 2021-08-30 LAB — CBC
HCT: 47.2 % (ref 39.0–52.0)
Hemoglobin: 16 g/dL (ref 13.0–17.0)
MCHC: 33.8 g/dL (ref 30.0–36.0)
MCV: 92.9 fl (ref 78.0–100.0)
Platelets: 229 10*3/uL (ref 150.0–400.0)
RBC: 5.08 Mil/uL (ref 4.22–5.81)
RDW: 13.4 % (ref 11.5–15.5)
WBC: 5.1 10*3/uL (ref 4.0–10.5)

## 2021-08-30 LAB — COMPREHENSIVE METABOLIC PANEL
ALT: 58 U/L — ABNORMAL HIGH (ref 0–53)
AST: 32 U/L (ref 0–37)
Albumin: 4.9 g/dL (ref 3.5–5.2)
Alkaline Phosphatase: 53 U/L (ref 39–117)
BUN: 16 mg/dL (ref 6–23)
CO2: 29 mEq/L (ref 19–32)
Calcium: 9.9 mg/dL (ref 8.4–10.5)
Chloride: 100 mEq/L (ref 96–112)
Creatinine, Ser: 0.87 mg/dL (ref 0.40–1.50)
GFR: 110.01 mL/min (ref >60.00)
Glucose, Bld: 94 mg/dL (ref 70–99)
Potassium: 4.2 mEq/L (ref 3.5–5.1)
Sodium: 137 mEq/L (ref 135–145)
Total Bilirubin: 0.7 mg/dL (ref 0.2–1.2)
Total Protein: 7.5 g/dL (ref 6.0–8.3)

## 2021-08-30 LAB — LDL CHOLESTEROL, DIRECT: Direct LDL: 169 mg/dL

## 2021-08-30 LAB — HEMOGLOBIN A1C: Hgb A1c MFr Bld: 5.6 % (ref 4.6–6.5)

## 2021-08-30 LAB — TSH: TSH: 1 u[IU]/mL (ref 0.35–5.50)

## 2021-08-30 NOTE — Patient Instructions (Signed)
It was very nice to see you today!  We will check blood work.   I think you have carpal tunnel syndrome.  Please work on the exercises and wear the splints at night.  Let me know if not improving.  I will see back in 1 year for your physical.  Come back sooner if needed.  Take care, Dr [name]  PLEASE NOTE:  If you had any lab tests please let us know if you have not heard back within a few days. You may see your results on mychart before we have a chance to review them but we will give you a call once they are reviewed by [place]. If we ordered any referrals today, please let us know if you have not heard from their office within the next week.   Please try these tips to maintain a healthy lifestyle:  Eat at least 3 REAL meals and 1-2 snacks per day.  Aim for no more than 5 hours between eating.  If you eat breakfast, please do so within one hour of getting up.   Each meal should contain half fruits/vegetables, one quarter protein, and one quarter carbs (no bigger than a computer mouse)  Cut down on sweet beverages. This includes juice, soda, and sweet tea.   Drink at least 1 glass of water with each meal and aim for at least 8 glasses per day  Exercise at least 150 minutes every week.    Preventive Care 49-33 Years Old, Male Preventive care refers to lifestyle choices and visits with your health care provider that can promote health and wellness. Preventive care visits are also called wellness exams. What can I expect for my preventive care visit? Counseling During your preventive care visit, your health care provider may ask about your: Medical history, including: Past medical problems. Family medical history. Current health, including: Emotional well-being. Home life and relationship well-being. Sexual activity. Lifestyle, including: Alcohol, nicotine or tobacco, and drug use. Access to firearms. Diet, exercise, and sleep habits. Safety issues such as seatbelt and bike helmet  use. Sunscreen use. Work and work Astronomer. Physical exam Your health care provider may check your: Height and weight. These may be used to calculate your BMI (body mass index). BMI is a measurement that tells if you are at a healthy weight. Waist circumference. This measures the distance around your waistline. This measurement also tells if you are at a healthy weight and may help predict your risk of certain diseases, such as type 2 diabetes and high blood pressure. Heart rate and blood pressure. Body temperature. Skin for abnormal spots. What immunizations do I need? Vaccines are usually given at various ages, according to a schedule. Your health care provider will recommend vaccines for you based on your age, medical history, and lifestyle or other factors, such as travel or where you work. What tests do I need? Screening Your health care provider may recommend screening tests for certain conditions. This may include: Lipid and cholesterol levels. Diabetes screening. This is done by checking your blood sugar (glucose) after you have not eaten for a while (fasting). Hepatitis B test. Hepatitis C test. HIV (human immunodeficiency virus) test. STI (sexually transmitted infection) testing, if you are at risk. Talk with your health care provider about your test results, treatment options, and if necessary, the need for more tests. Follow these instructions at home: Eating and drinking  Eat a healthy diet that includes fresh fruits and vegetables, whole grains, lean protein, and low-fat dairy  products. Drink enough fluid to keep your urine pale yellow. Take vitamin and mineral supplements as recommended by your health care provider. Do not drink alcohol if your health care provider tells you not to drink. If you drink alcohol: Limit how much you have to 0-2 drinks a day. Know how much alcohol is in your drink. In the [place] one drink equals one 12 oz bottle of beer (355 mL), one 5 oz  glass of wine (148 mL), or one 1 oz glass of hard liquor (44 mL). Lifestyle Brush your teeth every morning and night with fluoride toothpaste. Floss one time each day. Exercise for at least 30 minutes 5 or more days each week. Do not use any products that contain nicotine or tobacco. These products include cigarettes, chewing tobacco, and vaping devices, such as e-cigarettes. If you need help quitting, ask your health care provider. Do not use drugs. If you are sexually active, practice safe sex. Use a condom or other form of protection to prevent STIs. Find healthy ways to manage stress, such as: Meditation, yoga, or listening to music. Journaling. Talking to a trusted person. Spending time with friends and family. Minimize exposure to UV radiation to reduce your risk of skin cancer. Safety Always wear your seat belt while driving or riding in a vehicle. Do not drive: If you have been drinking alcohol. Do not ride with someone who has been drinking. If you have been using any mind-altering substances or drugs. While texting. When you are tired or distracted. Wear a helmet and other protective equipment during sports activities. If you have firearms in your house, make sure you follow all gun safety procedures. Seek help if you have been physically or sexually abused. What's next? Go to your health care provider once a year for an annual wellness visit. Ask your health care provider how often you should have your eyes and teeth checked. Stay up to date on all vaccines. This information is not intended to replace advice given to you by your health care provider. Make sure you discuss any questions you have with your health care provider. Document Revised: 01/09/2021 Document Reviewed: 01/09/2021 Elsevier Patient Education  2022 [employer].

## 2021-08-30 NOTE — Assessment & Plan Note (Signed)
Check labs.  Discussed lifestyle modifications. 

## 2021-08-30 NOTE — Progress Notes (Signed)
Chief Complaint:  Jonathan Strong is a 36 y.o. male who presents today for his annual comprehensive physical exam.    Assessment/Plan:  New/Acute Problems: Carpal tunnel syndrome No red flags.  We discussed home exercises and nighttime splinting.  He will let me know if not improving.  Chronic Problems Addressed Today: GAD (generalized anxiety disorder) Continue Celexa 20 mg daily.  He also uses Valium as needed.  Major depression in remission (HCC) Continue Celexa 20 mg daily.  Dyslipidemia Check labs.  Discussed lifestyle modifications.  Body mass index is 30.83 kg/m. / Obese  BMI Metric Follow Up - 08/30/21 1428       BMI Metric Follow Up-Please document annually   BMI Metric Follow Up Education provided            Preventative Healthcare: Check labs.  Patient Counseling(The following topics were reviewed and/or handout was given):  -Nutrition: Stressed importance of moderation in sodium/caffeine intake, saturated fat and cholesterol, caloric balance, sufficient intake of fresh fruits, vegetables, and fiber.  -Stressed the importance of regular exercise.   -Substance Abuse: Discussed cessation/primary prevention of tobacco, alcohol, or other drug use; driving or other dangerous activities under the influence; availability of treatment for abuse.   -Injury prevention: Discussed safety belts, safety helmets, smoke detector, smoking near bedding or upholstery.   -Sexuality: Discussed sexually transmitted diseases, partner selection, use of condoms, avoidance of unintended pregnancy and contraceptive alternatives.   -Dental health: Discussed importance of regular tooth brushing, flossing, and dental visits.  -Health maintenance and immunizations reviewed. Please refer to Health maintenance section.  Return to care in 1 year for next preventative visit.     Subjective:  HPI:  He has no acute complaints today.   He has intermittent numbness and tingling in  bilateral hands.  Usually occurs in the morning.  Sometimes occurs at work.  Usually located in his first 3 digits.  Occasional weakness.  Lifestyle Diet: None specific.  Exercise: Gets activity through work.   Depression screen [facility] 2/9 08/30/2021  Decreased Interest 0  Down, Depressed, Hopeless 0  PHQ - 2 Score 0  Altered sleeping -  Tired, decreased energy -  Change in appetite -  Feeling bad or failure about yourself  -  Trouble concentrating -  Moving slowly or fidgety/restless -  Suicidal thoughts -  PHQ-9 Score -  Difficult doing work/chores -  Some encounter information is confidential and restricted. Go to Review Flowsheets activity to see all data.   Health Maintenance Due  Topic Date Due   COVID-19 Vaccine (1) Never done   Hepatitis C Screening  Never done     ROS: Per HPI, otherwise a complete review of systems was negative.   PMH:  The following were reviewed and entered/updated in epic: Past Medical History:  Diagnosis Date   Depression    History of chicken pox    History of mononucleosis    treated at [facility] 3 weeks ago per patient   Hyperlipidemia    Patient Active Problem List   Diagnosis Date Noted   Major depression in remission ([place]) 05/24/2020   GAD (generalized anxiety disorder) 05/24/2020   Anger 08/30/2019   Dyslipidemia 08/26/2019   Stress 06/15/2017   Degeneration of lumbar intervertebral disc 03/09/2017   Chronic back pain, Low Back Pain 10/13/2014   Past Surgical History:  Procedure Laterality Date   NO PAST SURGERIES      Family History  Problem Relation Age of Onset  Mental illness Brother    Bipolar disorder Brother    Schizophrenia Father    Rectal cancer Neg Hx    Stomach cancer Neg Hx     Medications- reviewed and updated Current Outpatient Medications  Medication Sig Dispense Refill   citalopram (CELEXA) 10 MG tablet TAKE 2 TABLETS BY MOUTH EVERY DAY 60 tablet 5   diazepam (VALIUM) 10 MG tablet Take 1 tablet (10 mg  total) by mouth every 8 (eight) hours as needed. 30 tablet 2   Elastic Bandages & Supports (CRADLE ARM SLING) MISC Use as needed for shoulder pain. 1 each 0   No current facility-administered medications for this visit.    Allergies-reviewed and updated No Known Allergies  Social History   Socioeconomic History   Marital status: Married    Spouse name: Not on file   Number of children: 2   Years of education: Not on file   Highest education level: Not on file  Occupational History   Occupation: Architect  Tobacco Use   Smoking status: Every Day    Packs/day: 0.10    Types: Cigarettes   Smokeless tobacco: Never  Vaping Use   Vaping Use: Never used  Substance and Sexual Activity   Alcohol use: Yes    Alcohol/week: 35.0 standard drinks    Types: 14 Cans of beer, 21 Shots of liquor per week   Drug use: No   Sexual activity: Yes    Partners: Female  Other Topics Concern   Not on file  Social History Narrative   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[employer], Banker - cabinets, [employer]      Home Situation: lives with wife and 2 daughters - 3 yrs and 3 months in 2016      Spiritual Beliefs: none      Lifestyle: no regular exercise; diet is ok      Investment banker, operational of Radio broadcast assistant Strain: Not on file  Food Insecurity: Not on file  Transportation Needs: Not on file  Physical Activity: Not on file  Stress: Not on file  Social Connections: Not on file        Objective:  Physical Exam: BP 130/88 (BP Location: Right Arm)    Pulse 85    Temp 98.1 F (36.7 C) (Temporal)    Ht 6' 0.05" (1.83 m)    Wt 227 lb 9.6 oz (103.2 kg)    SpO2 95%    BMI 30.83 kg/m   Body mass index is 30.83 kg/m. Wt Readings from Last 3 Encounters:  08/30/21 227 lb 9.6 oz (103.2 kg)  12/14/20 219 lb 3.2 oz (99.4 kg)  11/22/20 215 lb 6.1 oz (97.7 kg)   Gen: NAD, resting comfortably HEENT: TMs normal bilaterally. OP clear. No thyromegaly noted.  CV: RRR with no  murmurs appreciated Pulm: NWOB, CTAB with no crackles, wheezes, or rhonchi GI: Normal bowel sounds present. Soft, Nontender, Nondistended. MSK: no edema, cyanosis, or clubbing noted.  - Wrist: No deformities.  Neurovascular intact distally.  Positive Tinel's sign at bilateral wrist. Skin: warm, dry Neuro: CN2-12 grossly intact. Strength 5/5 in upper and lower extremities. Reflexes symmetric and intact bilaterally.  Psych: Normal affect and thought content     Rahul Malinak M. Jerline Pain, MD 08/30/2021 2:28 PM

## 2021-08-30 NOTE — Assessment & Plan Note (Signed)
Continue Celexa 20 mg daily.  He also uses Valium as needed.

## 2021-08-30 NOTE — Assessment & Plan Note (Signed)
Continue Celexa 20 mg daily.

## 2021-09-03 NOTE — Progress Notes (Signed)
Please inform patient of the following:  Cholesterol is up a bit and one of his liver numbers is mildly elevated. HE should work hard on getting regular exercise and improving his diet. Do not need to start any medications at this point. We can recheck in 1 year.  Jonathan Strong. [name], MD 09/03/2021 4:49 PM

## 2021-09-10 ENCOUNTER — Ambulatory Visit: Payer: Managed Care, Other (non HMO) | Admitting: Family Medicine

## 2021-09-10 ENCOUNTER — Other Ambulatory Visit: Payer: Self-pay

## 2021-09-10 DIAGNOSIS — G5603 Carpal tunnel syndrome, bilateral upper limbs: Secondary | ICD-10-CM

## 2021-09-10 NOTE — Progress Notes (Incomplete)
° °  Jonathan Strong is a 36 y.o. male who presents today for an office visit.  Assessment/Plan:  New/Acute Problems: ***  Chronic Problems Addressed Today: No problem-specific Assessment & Plan notes found for this encounter.     Subjective:  HPI:  He has had issue with numbness and tingling in bilateral hands. He was recommended to start home exercises and splinting at last visit.       Objective:  Physical Exam: There were no vitals taken for this visit.  Gen: No acute distress, resting comfortably*** CV: Regular rate and rhythm with no murmurs appreciated Pulm: Normal work of breathing, clear to auscultation bilaterally with no crackles, wheezes, or rhonchi Neuro: Grossly normal, moves all extremities Psych: Normal affect and thought content       I,Savera Zaman,acting as a scribe for Dimas Chyle, MD.,have documented all relevant documentation on the behalf of Dimas Chyle, MD,as directed by  Dimas Chyle, MD while in the presence of Dimas Chyle, MD.   *** Algis Greenhouse. Jerline Pain, MD 09/10/2021 8:12 AM

## 2021-09-12 ENCOUNTER — Other Ambulatory Visit: Payer: Self-pay

## 2021-09-12 ENCOUNTER — Ambulatory Visit: Payer: Managed Care, Other (non HMO) | Admitting: Family Medicine

## 2021-09-12 ENCOUNTER — Ambulatory Visit: Payer: Self-pay

## 2021-09-12 VITALS — BP 130/88 | HR 86 | Ht 72.0 in | Wt 229.8 lb

## 2021-09-12 DIAGNOSIS — R202 Paresthesia of skin: Secondary | ICD-10-CM | POA: Diagnosis not present

## 2021-09-12 DIAGNOSIS — G5603 Carpal tunnel syndrome, bilateral upper limbs: Secondary | ICD-10-CM | POA: Diagnosis not present

## 2021-09-12 NOTE — Progress Notes (Signed)
Subjective:    CC: B hand paresthesias / carpal tunnel syndrome  I, Christoper Fabian, LAT, ATC, am serving as scribe for Dr. Clementeen Graham.  HPI: Pt is a 36 y/o male c/o B hand paresthesias x 1-2 months, L>R. 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 He locates his symptoms to his fingers 1-3, hand, and across wrist. Pt notes his L wrist/hand is much worse and the R wrist/hand will only bother him once in awhile. Pt c/o burning pain and numbness worsening and night and waking him.  Neck pain: pain Swelling: no Grip strength: decrease Aggravating factors: none Treatments tried: Carpal tunnel wrist brace at bedtime.  Diagnostic testing: R wrist XR- 11/22/20  Pertinent review of Systems: No fevers or chills  Relevant historical information: Depression.  Is a Arts administrator.  Right-hand-dominant but relatively ambidextrous.  He notes that gabapentin made him suicidal.   Objective:    Vitals:   09/12/21 0812  BP: 130/88  Pulse: 86  SpO2: 97%   General: Well Developed, well nourished, and in no acute distress.   MSK:  Right hand and wrist normal-appearing well-developed musculature.  Normal grip strength.  Mildly positive Tinel's.  Positive Phalen's test. Capillary refill and pulses are intact.  Left hand and wrist normal-appearing well-developed musculature.  Normal grip strength.  Very positive Tinel's.  Positive Phalen's test. Capillary refill and pulses are intact.  Lab and Radiology Results   Procedure: Real-time Ultrasound Guided hydrodissection median nerve left carpal tunnel Device: Philips Affiniti 50G Images permanently stored and available for review in PACS Verbal informed consent obtained.  Discussed risks and benefits of procedure. Warned about infection bleeding damage to structures skin hypopigmentation and fat atrophy among others. Patient expresses understanding and agreement Time-out conducted.   Noted no overlying erythema,  induration, or other signs of local infection.   Skin prepped in a sterile fashion.   Local anesthesia: Topical Ethyl chloride.   With sterile technique and under real time ultrasound guidance: 40 mg of Kenalog and 1 mL of lidocaine injected into carpal tunnel around median nerve. Fluid seen entering the carpal tunnel.   Completed without difficulty   Pain immediately resolved suggesting accurate placement of the medication.   Advised to call if fevers/chills, erythema, induration, drainage, or persistent bleeding.   Images permanently stored and available for review in the ultrasound unit.  Impression: Technically successful ultrasound guided injection.    Procedure: Real-time Ultrasound Guided hydrodissection median nerve  right carpal tunnel Device: Philips Affiniti 50G Images permanently stored and available for review in PACS Verbal informed consent obtained.  Discussed risks and benefits of procedure. Warned about infection bleeding damage to structures skin hypopigmentation and fat atrophy among others. Patient expresses understanding and agreement Time-out conducted.   Noted no overlying erythema, induration, or other signs of local infection.   Skin prepped in a sterile fashion.   Local anesthesia: Topical Ethyl chloride.   With sterile technique and under real time ultrasound guidance: 40 mg of Kenalog and 1 mL of lidocaine injected into right carpal tunnel around median nerve. Fluid seen entering the carpal tunnel.   Completed without difficulty   Pain immediately resolved suggesting accurate placement of the medication.   Advised to call if fevers/chills, erythema, induration, drainage, or persistent bleeding.   Images permanently stored and available for review in the ultrasound unit.  Impression: Technically successful ultrasound guided injection.        Impression and Recommendations:    Assessment and  Plan: 36 y.o. male with bilateral carpal tunnel syndrome left  worse than right.  Patient is already had a good trial of conservative management strategies including night splinting.  He is clearly intolerant of gabapentin with suicidal ideation previously so this medicine and its relatives should be avoided.  Next step at this point is median nerve hydrodissection/carpal tunnel injection bilaterally with continued night splints.  If this is not effective I would recommend moving relatively quickly to nerve conduction study/EMG in preparation of evaluation and potential surgery.  Of note Dr. Allena Katz at [facility] neurology will be going on maternity leave in May so access to nerve conduction study from May till August will be limited.  I made him aware of this which may influence our decision making on when to proceed to a nerve conduction study.Marland Kitchen  PDMP not reviewed this encounter. Orders Placed This Encounter  Procedures   [place] LIMITED JOINT SPACE STRUCTURES UP BILAT(NO LINKED CHARGES)    Order Specific Question:   Reason for Exam (SYMPTOM  OR DIAGNOSIS REQUIRED)    Answer:   hand paresthesias    Order Specific Question:   Preferred imaging location?    Answer:   [facility]   No orders of the defined types were placed in this encounter.   Discussed warning signs or symptoms. Please see discharge instructions. Patient expresses understanding.   The above documentation has been reviewed and is accurate and complete Clementeen Graham, M.D.

## 2021-09-12 NOTE — Patient Instructions (Addendum)
Thank you for coming in today.   You received steroid injections in both of your wrists today. Seek immediate medical attention if the joint becomes red, extremely painful, or is oozing fluid.   Let me know about nerve conduction study.  Recheck back as needed

## 2021-09-16 ENCOUNTER — Other Ambulatory Visit: Payer: Self-pay | Admitting: *Deleted

## 2021-09-16 ENCOUNTER — Telehealth: Payer: Self-pay | Admitting: Family Medicine

## 2021-09-16 ENCOUNTER — Encounter: Payer: Self-pay | Admitting: Family Medicine

## 2021-09-16 DIAGNOSIS — G8929 Other chronic pain: Secondary | ICD-10-CM

## 2021-09-16 NOTE — Telephone Encounter (Signed)
Ok with me. Please place any necessary orders. 

## 2021-09-16 NOTE — Telephone Encounter (Signed)
Please advise 

## 2021-09-16 NOTE — Telephone Encounter (Signed)
Referral placed.

## 2021-09-16 NOTE — Telephone Encounter (Signed)
Patient is having back pain.- stated dr [name] is aware of his back issues, patient would like a referral to see ortho.-

## 2021-09-17 ENCOUNTER — Other Ambulatory Visit: Payer: Self-pay | Admitting: *Deleted

## 2021-09-17 DIAGNOSIS — G8929 Other chronic pain: Secondary | ICD-10-CM

## 2021-09-17 DIAGNOSIS — M545 Low back pain, unspecified: Secondary | ICD-10-CM

## 2021-09-17 NOTE — Telephone Encounter (Signed)
Pt advised he has been going to [facility] for the dry needling; it has been 4-5 months since his last visit and was told he can come back there but will need a new referral since it had been over a month since last visit.

## 2021-09-17 NOTE — Telephone Encounter (Signed)
Ok to place referral.  Jonathan Strong. [name], MD 09/17/2021 11:44 AM

## 2021-09-17 NOTE — Telephone Encounter (Signed)
Referral placed on 09/16/2021

## 2021-09-17 NOTE — Telephone Encounter (Signed)
Please advise 

## 2021-09-17 NOTE — Telephone Encounter (Signed)
I would recommend he discuss with his orthopedist to see if they have a specific person they recommend for this.  Jonathan Strong. Jerline Pain, MD 09/17/2021 10:24 AM

## 2021-10-21 ENCOUNTER — Encounter: Payer: Self-pay | Admitting: Family Medicine

## 2021-10-21 ENCOUNTER — Ambulatory Visit (INDEPENDENT_AMBULATORY_CARE_PROVIDER_SITE_OTHER): Payer: Managed Care, Other (non HMO) | Admitting: Family Medicine

## 2021-10-21 VITALS — BP 140/86 | HR 80 | Temp 97.9°F | Ht 72.0 in | Wt 226.2 lb

## 2021-10-21 DIAGNOSIS — F325 Major depressive disorder, single episode, in full remission: Secondary | ICD-10-CM

## 2021-10-21 DIAGNOSIS — G4730 Sleep apnea, unspecified: Secondary | ICD-10-CM

## 2021-10-21 DIAGNOSIS — F411 Generalized anxiety disorder: Secondary | ICD-10-CM | POA: Diagnosis not present

## 2021-10-21 NOTE — Progress Notes (Signed)
? ?  Sargent Mankey is a 36 y.o. male who presents today for an office visit. ? ?Assessment/Plan:  ?Chronic Problems Addressed Today: ?Sleep-disordered breathing ?We will refer for sleep study. ? ?GAD (generalized anxiety disorder) ?Currently seeing a counselor.  Overall he feels like the medications are working well.  Continue Celexa 20 mg daily and Valium 10 mg every 8 hours as needed. ? ?Major depression in remission [facility]) ?He is working through relationship issues with his wife.  They are currently seeing a couples therapist.  This seems to be going well.  He feels like his medications are not a good place.  We will continue Celexa 20 mg daily. ? ?  ?Subjective:  ?HPI: ? ?Patient here with concerns for snoring. Longstanding issue.  No witnessed apneic episodes though his wife does say that he snores loudly.  This sometimes leads to arguments.  No daytime somnolence. ? ?   ?  ?Objective:  ?Physical Exam: ?BP 140/86 (BP Location: Right Arm)   Pulse 80   Temp 97.9 ?F (36.6 ?C) (Temporal)   Ht 6' (1.829 m)   Wt 226 lb 3.2 oz (102.6 kg)   SpO2 98%   BMI 30.68 kg/m?   ?Gen: No acute distress, resting comfortably ?Psych: Normal affect and thought content ? ?   ? ?[name]. [name], MD ?10/21/2021 11:07 AM  ?

## 2021-10-21 NOTE — Assessment & Plan Note (Signed)
He is working through relationship issues with his wife.  They are currently seeing a couples therapist.  This seems to be going well.  He feels like his medications are not a good place.  We will continue Celexa 20 mg daily. ?

## 2021-10-21 NOTE — Patient Instructions (Signed)
It was very nice to see you today! ? ?I will refer you to have a sleep study done.  They should be contacting you within the next few days to have this scheduled. ? ?Take care, ?Dr [name] ? ?PLEASE NOTE: ? ?If you had any lab tests please let us know if you have not heard back within a few days. You may see your results on mychart before we have a chance to review them but we will give you a call once they are reviewed by [place]. If we ordered any referrals today, please let us know if you have not heard from their office within the next week.  ? ?Please try these tips to maintain a healthy lifestyle: ? ?Eat at least 3 REAL meals and 1-2 snacks per day.  Aim for no more than 5 hours between eating.  If you eat breakfast, please do so within one hour of getting up.  ? ?Each meal should contain half fruits/vegetables, one quarter protein, and one quarter carbs (no bigger than a computer mouse) ? ?Cut down on sweet beverages. This includes juice, soda, and sweet tea.  ? ?Drink at least 1 glass of water with each meal and aim for at least 8 glasses per day ? ?Exercise at least 150 minutes every week.   ?

## 2021-10-21 NOTE — Assessment & Plan Note (Signed)
We will refer for sleep study. 

## 2021-10-21 NOTE — Assessment & Plan Note (Signed)
Currently seeing a counselor.  Overall he feels like the medications are working well.  Continue Celexa 20 mg daily and Valium 10 mg every 8 hours as needed. ?

## 2021-11-14 ENCOUNTER — Encounter: Payer: Self-pay | Admitting: Family Medicine

## 2021-11-17 ENCOUNTER — Other Ambulatory Visit: Payer: Self-pay | Admitting: Family Medicine

## 2021-12-20 ENCOUNTER — Other Ambulatory Visit: Payer: Self-pay | Admitting: Family Medicine

## 2021-12-20 NOTE — Telephone Encounter (Signed)
Last visit: 10/21/21  Next visit: 09/05/22  Last filled: 07/24/21  Quantity: 30 w/ 2 refills

## 2022-01-08 ENCOUNTER — Telehealth: Payer: Self-pay | Admitting: Family Medicine

## 2022-01-08 MED ORDER — CITALOPRAM HYDROBROMIDE 20 MG PO TABS: 30.0000 mg | ORAL_TABLET | Freq: Every day | ORAL | 0 refills | Status: DC

## 2022-01-08 NOTE — Telephone Encounter (Signed)
Patient stated that he has had an episode yesterday, it is mainly his anger that he is trying to control. Patient wanted to know if he could increase medication to 30 mg until Dr. [name] get back, does not want to double the medication right now. Patient confirmed he is taking 20 mg. Please advise.

## 2022-01-08 NOTE — Telephone Encounter (Signed)
Pt states he thinks he may need to have his rx, Celexa, increased. Due to [name] being out until July. Would Dr Ruthine Dose be willing to see him intermittenly and then we can schedule a follow up with [name] in 1 month. Please advise

## 2022-01-08 NOTE — Telephone Encounter (Signed)
Please advise 

## 2022-01-09 NOTE — Telephone Encounter (Signed)
Patient notified and verbalized understanding. 

## 2022-02-17 ENCOUNTER — Other Ambulatory Visit: Payer: Self-pay | Admitting: *Deleted

## 2022-02-17 MED ORDER — CITALOPRAM HYDROBROMIDE 20 MG PO TABS: 30.0000 mg | ORAL_TABLET | Freq: Every day | ORAL | 0 refills | Status: DC

## 2022-02-17 NOTE — Telephone Encounter (Signed)
[facility]

## 2022-02-17 NOTE — Telephone Encounter (Signed)
Patient requests a RX for the following RX be 30mg :   LAST APPOINTMENT DATE:  Please schedule appointment if longer than 1 year  10/21/21  NEXT APPOINTMENT DATE: 09/05/22  MEDICATION:citalopram (CELEXA) 20 MG tablet  30mg :  Is the patient out of medication? Yes-for 2 days  [id number] [place], [place] - [address] AT [address] [facility] [address] Phone:  [phone]  Fax:  [phone]      Let patient know to contact pharmacy at the end of the day to make sure medication is ready.  Please notify patient to allow 48-72 hours to process

## 2022-02-27 ENCOUNTER — Encounter: Payer: Self-pay | Admitting: Internal Medicine

## 2022-02-27 ENCOUNTER — Ambulatory Visit (INDEPENDENT_AMBULATORY_CARE_PROVIDER_SITE_OTHER): Payer: Commercial Managed Care - HMO | Admitting: Internal Medicine

## 2022-02-27 VITALS — BP 118/80 | HR 90 | Temp 98.3°F | Ht 72.0 in | Wt 227.0 lb

## 2022-02-27 DIAGNOSIS — R051 Acute cough: Secondary | ICD-10-CM

## 2022-02-27 DIAGNOSIS — J320 Chronic maxillary sinusitis: Secondary | ICD-10-CM | POA: Diagnosis not present

## 2022-02-27 MED ORDER — AMOXICILLIN-POT CLAVULANATE 875-125 MG PO TABS: 1.0000 | ORAL_TABLET | Freq: Two times a day (BID) | ORAL | 0 refills | Status: DC

## 2022-02-27 MED ORDER — PSEUDOEPHEDRINE HCL 60 MG PO TABS: 60.0000 mg | ORAL_TABLET | ORAL | 0 refills | Status: DC | PRN

## 2022-02-27 NOTE — Patient Instructions (Addendum)
  Start Sudafed for your cough if you like you can also take an over-the-counter cough syrup like Robitussin.  Start daily sinus rinses with warm and Hammer simply saline.  For the duration of your cough and congestion use nightly Flonase nasal spray as prescribed  Start the augmentin if your symptoms worsen or do not improve in a few days.  Please stay well hydrated.  You can take tylenol and/or motrin as needed for low grade fever and pain.  Please let me know if your symptoms worsen or fail to improve.  Take care, Lula Olszewski, MD 02/27/2022 5:04 PM

## 2022-02-27 NOTE — Progress Notes (Signed)
   Jonathan Strong is a 36 y.o. male who presents today for an office visit.  Assessment/Plan:      Problem List Items Addressed This Visit   None Visit Diagnoses     Acute cough    -  Primary   Chronic maxillary sinusitis           Probably allergic rhinitis to chronic sinusitis, but he is doubtful of significant allergic component  Gave the following avs recs:  Start Sudafed for your cough if you like you can also take an over-the-counter cough syrup like Robitussin.  Start daily sinus rinses with warm and Hammer simply saline.  For the duration of your cough and congestion use nightly Flonase nasal spray as prescribed  Start the augmentin if your symptoms worsen or do not improve in a few days.  Please stay well hydrated.  You can take tylenol and/or motrin as needed for low grade fever and pain.  Please let me know if your symptoms worsen or fail to improve.     Subjective:  HPI:  Overview: Patient presents with cough for the last 2 to 3 weeks previously had the cough in the past and had fluid in the lungs.  Also has had nasal congestion for the last 3 days.  No shortness of breath just coughing up a lot of mucus.  No fever no side effects no fatigue.  No body aches just a lot of phlegm's sinus congestion slight change in the feeling in my ears no other symptoms.         Objective:  Physical Exam: BP 118/80 (BP Location: Left Arm, Patient Position: Sitting)   Pulse 90   Temp 98.3 F (36.8 C) (Temporal)   Ht 6' (1.829 m)   Wt 227 lb (103 kg)   SpO2 95%   BMI 30.79 kg/m    Gen: No acute distress, resting comfortably Psych: Normal affect and thought content  Problem specific physical exam findings:  Erythematous nasal mucosa Very moist oropharynx with mildly swollen tonsils CTAB and RRR no mrg 1 cm r mid SCM cervical lad.       Lula Olszewski, MD 02/27/2022 5:06 PM

## 2022-03-15 ENCOUNTER — Other Ambulatory Visit: Payer: Self-pay | Admitting: Family Medicine

## 2022-03-28 ENCOUNTER — Telehealth: Payer: Self-pay | Admitting: Family Medicine

## 2022-03-28 NOTE — Telephone Encounter (Signed)
..   Encourage patient to contact the pharmacy for refills or they can request refills through [facility]  LAST APPOINTMENT DATE:  Please schedule appointment if longer than 1 year  NEXT APPOINTMENT DATE: 08/2022   MEDICATION:diazepam (VALIUM) 10 MG tablet  Is the patient out of medication?  Yes   [facility] [address]  Let patient know to contact pharmacy at the end of the day to make sure medication is ready.  Please notify patient to allow 48-72 hours to process

## 2022-04-08 ENCOUNTER — Other Ambulatory Visit: Payer: Self-pay | Admitting: *Deleted

## 2022-04-08 MED ORDER — DIAZEPAM 10 MG PO TABS: 10.0000 mg | ORAL_TABLET | Freq: Three times a day (TID) | ORAL | 3 refills | Status: DC | PRN

## 2022-04-08 NOTE — Telephone Encounter (Signed)
Patient requests RX for citalopram (CELEXA) 20 MG tablet be sent to same Pharmacy with RX for diazepam (VALIUM) 10 MG tablet

## 2022-04-09 ENCOUNTER — Other Ambulatory Visit: Payer: Self-pay | Admitting: *Deleted

## 2022-04-09 MED ORDER — CITALOPRAM HYDROBROMIDE 20 MG PO TABS: ORAL_TABLET | ORAL | 0 refills | Status: DC

## 2022-04-09 NOTE — Telephone Encounter (Signed)
[facility]  Patient notified  

## 2022-04-10 ENCOUNTER — Encounter: Payer: Self-pay | Admitting: Family Medicine

## 2022-04-10 NOTE — Telephone Encounter (Signed)
Patient states pharmacy will not fill diazepam until the 22nd.  Is requesting provider to send [facility] [address] to get [facility] through until the 22nd.    Patient states he is currently out of diazepam and was not able to work today due to being out of medication.  Is requesting call back in regard at [phone].

## 2022-04-14 NOTE — Telephone Encounter (Signed)
We sent in 30 pills on 04/08/2021.

## 2022-04-14 NOTE — Telephone Encounter (Signed)
Please advise 

## 2022-04-14 NOTE — Telephone Encounter (Signed)
This was sent in on the 12th

## 2022-05-15 ENCOUNTER — Other Ambulatory Visit: Payer: Self-pay | Admitting: Family Medicine

## 2022-06-13 ENCOUNTER — Other Ambulatory Visit: Payer: Self-pay | Admitting: Family Medicine

## 2022-07-10 ENCOUNTER — Other Ambulatory Visit: Payer: Self-pay | Admitting: Family Medicine

## 2022-08-01 ENCOUNTER — Encounter: Payer: Self-pay | Admitting: Internal Medicine

## 2022-08-01 ENCOUNTER — Ambulatory Visit (INDEPENDENT_AMBULATORY_CARE_PROVIDER_SITE_OTHER): Payer: Commercial Managed Care - HMO | Admitting: Internal Medicine

## 2022-08-01 ENCOUNTER — Telehealth: Payer: Self-pay | Admitting: Family Medicine

## 2022-08-01 VITALS — BP 108/71 | HR 85 | Temp 98.3°F | Ht 72.0 in | Wt 228.0 lb

## 2022-08-01 DIAGNOSIS — G8929 Other chronic pain: Secondary | ICD-10-CM

## 2022-08-01 DIAGNOSIS — R110 Nausea: Secondary | ICD-10-CM

## 2022-08-01 DIAGNOSIS — M545 Low back pain, unspecified: Secondary | ICD-10-CM | POA: Diagnosis not present

## 2022-08-01 DIAGNOSIS — M549 Dorsalgia, unspecified: Secondary | ICD-10-CM

## 2022-08-01 MED ORDER — METHOCARBAMOL 750 MG PO TABS: 750.0000 mg | ORAL_TABLET | Freq: Four times a day (QID) | ORAL | 1 refills | Status: DC

## 2022-08-01 MED ORDER — KETOROLAC TROMETHAMINE 10 MG PO TABS: 10.0000 mg | ORAL_TABLET | Freq: Four times a day (QID) | ORAL | 0 refills | Status: DC | PRN

## 2022-08-01 MED ORDER — DIAZEPAM 10 MG PO TABS: 10.0000 mg | ORAL_TABLET | Freq: Three times a day (TID) | ORAL | 3 refills | Status: DC | PRN

## 2022-08-01 MED ORDER — ONDANSETRON 4 MG PO TBDP: 4.0000 mg | ORAL_TABLET | Freq: Three times a day (TID) | ORAL | 0 refills | Status: DC | PRN

## 2022-08-01 MED ORDER — KETOROLAC TROMETHAMINE 60 MG/2ML IM SOLN
60.0000 mg | Freq: Once | INTRAMUSCULAR | Status: AC
  Administered 2022-08-01: 60 mg via INTRAMUSCULAR

## 2022-08-01 NOTE — Telephone Encounter (Signed)
[facility] requests to be called for clarification of RX received for ketorolac (TORADOL) 10 MG tablet

## 2022-08-01 NOTE — Assessment & Plan Note (Addendum)
Today:  Gets flares at least yearly He leaned over to usher his dog in the door and that caused this flare Pain currently 8/10 Patient reports bulging disks, known djd l3,l4,l5 Basically the muscle lock up in between  He guesses that he wasn't moving around enough and when he leaned over the disk popped out and started pinching his nerves He doesn't know if its centered- what he means by that is that his pelvis gets tilted so one hip is higher at times.   He is interested in seeing sports medicine- and has seen rigby and likes but wants soon if he isn't available. Has seen [facility] and they do dry needling rigby doesn't do, he really wants that asap I did referrals as stat and told refer coordinator to try to make happen quickly Is not interested in XR, old XR reviewed and didn't show much He would like toradol that sometimes help but declines steroids Dry needling has been so helpful in past. Takes valium for chronic muscle spasm- but he is out needs refill; prescription drug management db checked and this was filled

## 2022-08-01 NOTE — Progress Notes (Signed)
[facility]: [phone]   Routine Medical Office Visit  Patient:  Jonathan Strong      Age: 37 y.o.       Sex:  male  Date:   08/01/2022  PCP:    Vivi Barrack, [place] Provider: Loralee Pacas, MD  Assessment/Plan:   Jonathan Strong was seen today for referral for back pain doctor.  Chronic bilateral low back pain without sciatica Overview: Lumbarized S1 Eval with [name] in 2015-2016, epidural injections Hx radicular pain Sees chiroprator Reports only valium helps  Assessment & Plan: Today:  Gets flares at least yearly He leaned over to usher his dog in the door and that caused this flare Pain currently 8/10 Patient reports bulging disks, known djd l3,l4,l5 Basically the muscle lock up in between  He guesses that he wasn't moving around enough and when he leaned over the disk popped out and started pinching his nerves He doesn't know if its centered- what he means by that is that his pelvis gets tilted so one hip is higher at times.   He is interested in seeing sports medicine- and has seen rigby and likes but wants soon if he isn't available. Has seen [facility] and they do dry needling rigby doesn't do, he really wants that asap I did referrals as stat and told refer coordinator to try to make happen quickly Is not interested in XR, old XR reviewed and didn't show much He would like toradol that sometimes help but declines steroids Dry needling has been so helpful in past. Takes valium for chronic muscle spasm- but he is out needs refill; prescription drug management db checked and this was filled  Orders: -     diazePAM; Take 1 tablet (10 mg total) by mouth every 8 (eight) hours as needed.  Dispense: 30 tablet; Refill: 3  Back pain due to injury -     Ketorolac Tromethamine -     Ketorolac Tromethamine; Take 1 tablet (10 mg total) by mouth every 6 (six) hours as needed.  Dispense: 20 tablet; Refill: 0 -      Methocarbamol; Take 1 tablet (750 mg total) by mouth 4 (four) times daily.  Dispense: 60 tablet; Refill: 1 -     Ambulatory referral to Orthopedics -     Ambulatory referral to Sports Medicine  Nausea -     Ondansetron; Take 1 tablet (4 mg total) by mouth every 8 (eight) hours as needed for nausea or vomiting (for nausea from wegovy or other source).  Dispense: 20 tablet; Refill: 0      Subjective:   Jonathan Strong is a 37 y.o. male with the following chart data reviewed: Past Medical History:  Diagnosis Date   Depression    History of chicken pox    History of mononucleosis    treated at [facility] ER 3 weeks ago per patient   Hyperlipidemia    Outpatient Medications Prior to Visit  Medication Sig   amoxicillin-clavulanate (AUGMENTIN) 875-125 MG tablet Take 1 tablet by mouth 2 (two) times daily.   citalopram (CELEXA) 20 MG tablet TAKE 1 AND 1/2 TABLETS BY MOUTH DAILY   pseudoephedrine (SUDAFED) 60 MG tablet Take 1 tablet (60 mg total) by mouth every 4 (four) hours as needed for congestion.   [DISCONTINUED] diazepam (VALIUM) 10 MG tablet Take 1 tablet (10 mg total) by mouth every 8 (eight) hours as needed.   No facility-administered medications prior to  visit.     He presented today reporting reason for visit as: Chief Complaint  Patient presents with   Referral for back pain doctor    Lower back pain.       PROBLEM FOCUSED CHARTING  Based on chart review and/or medical interview, the problem list overview sections were updated today as follows: Problem  Chronic back pain, Low Back Pain   Lumbarized S1 Eval with [name] in 2015-2016, epidural injections Hx radicular pain Sees chiroprator Reports only valium helps            Objective:  Physical Exam  BP 108/71 (BP Location: Right Arm, Patient Position: Sitting)   Pulse 85   Temp 98.3 F (36.8 C) (Temporal)   Ht 6' (1.829 m)   Wt 228 lb (103.4 kg)   SpO2 96%   BMI 30.92 kg/m  Wt Readings from Last  10 Encounters:  08/01/22 228 lb (103.4 kg)  02/27/22 227 lb (103 kg)  10/21/21 226 lb 3.2 oz (102.6 kg)  09/12/21 229 lb 12.8 oz (104.2 kg)  08/30/21 227 lb 9.6 oz (103.2 kg)  12/14/20 219 lb 3.2 oz (99.4 kg)  11/22/20 215 lb 6.1 oz (97.7 kg)  04/12/20 210 lb (95.3 kg)  01/31/20 213 lb (96.6 kg)  09/14/19 219 lb (99.3 kg)   Today's vital signs reviewed.  Nursing notes reviewed. Weight trend reviewed. General Appearance/Constitutional:  Obese male according to BMI standardized data, but waist circumference data should be used instead because BMI-based assessments do not account for lean muscle mass or the fact that only adipose tissue around the waist is unhealthy.  He is in no acute distress. Musculoskeletal: All extremities are intact.  Neurological:  Awake, alert,  No obvious focal neurological deficits or cognitive impairments Psychiatric:  Appropriate mood, pleasant demeanor Problem-specific findings:  looks in pain can't really lean forward or get comfortable   Loralee Pacas, MD

## 2022-08-04 NOTE — Telephone Encounter (Signed)
This was sent in my Dr Randol Kern last week.  Algis Greenhouse. Jerline Pain, MD 08/04/2022 7:26 AM

## 2022-08-05 NOTE — Telephone Encounter (Signed)
See note

## 2022-08-12 ENCOUNTER — Other Ambulatory Visit: Payer: Self-pay | Admitting: Family Medicine

## 2022-08-15 NOTE — Telephone Encounter (Signed)
[facility] and this should be resolved for him to pick up this prescription now.

## 2022-08-15 NOTE — Telephone Encounter (Signed)
[facility], I spoke with the patient and confirmed that he still has not received this medication.

## 2022-08-19 ENCOUNTER — Encounter: Payer: Self-pay | Admitting: Family Medicine

## 2022-08-19 NOTE — Telephone Encounter (Signed)
Please advise 

## 2022-08-20 ENCOUNTER — Other Ambulatory Visit: Payer: Self-pay | Admitting: *Deleted

## 2022-08-20 DIAGNOSIS — Z79899 Other long term (current) drug therapy: Secondary | ICD-10-CM

## 2022-08-20 NOTE — Telephone Encounter (Signed)
Ok to place referral.  Jonathan Strong. Jerline Pain, MD 08/20/2022 7:31 AM

## 2022-08-20 NOTE — Telephone Encounter (Signed)
Referral placed.

## 2022-09-05 ENCOUNTER — Ambulatory Visit (INDEPENDENT_AMBULATORY_CARE_PROVIDER_SITE_OTHER): Payer: Commercial Managed Care - HMO | Admitting: Family Medicine

## 2022-09-05 ENCOUNTER — Other Ambulatory Visit (HOSPITAL_COMMUNITY)
Admission: RE | Admit: 2022-09-05 | Discharge: 2022-09-05 | Disposition: A | Discharge status: Home / Self Care | Payer: Commercial Managed Care - HMO | Source: Ambulatory Visit | Attending: Family Medicine | Admitting: Family Medicine

## 2022-09-05 ENCOUNTER — Encounter: Payer: Self-pay | Admitting: Family Medicine

## 2022-09-05 VITALS — BP 122/74 | HR 80 | Temp 97.8°F | Ht 72.0 in | Wt 223.8 lb

## 2022-09-05 DIAGNOSIS — F325 Major depressive disorder, single episode, in full remission: Secondary | ICD-10-CM | POA: Diagnosis not present

## 2022-09-05 DIAGNOSIS — E785 Hyperlipidemia, unspecified: Secondary | ICD-10-CM | POA: Diagnosis not present

## 2022-09-05 DIAGNOSIS — Z114 Encounter for screening for human immunodeficiency virus [HIV]: Secondary | ICD-10-CM

## 2022-09-05 DIAGNOSIS — G8929 Other chronic pain: Secondary | ICD-10-CM

## 2022-09-05 DIAGNOSIS — F411 Generalized anxiety disorder: Secondary | ICD-10-CM

## 2022-09-05 DIAGNOSIS — Z113 Encounter for screening for infections with a predominantly sexual mode of transmission: Secondary | ICD-10-CM | POA: Insufficient documentation

## 2022-09-05 DIAGNOSIS — Z0001 Encounter for general adult medical examination with abnormal findings: Secondary | ICD-10-CM | POA: Diagnosis not present

## 2022-09-05 DIAGNOSIS — M545 Low back pain, unspecified: Secondary | ICD-10-CM

## 2022-09-05 DIAGNOSIS — R739 Hyperglycemia, unspecified: Secondary | ICD-10-CM

## 2022-09-05 DIAGNOSIS — Z1159 Encounter for screening for other viral diseases: Secondary | ICD-10-CM | POA: Diagnosis not present

## 2022-09-05 LAB — LIPID PANEL
Cholesterol: 266 mg/dL — ABNORMAL HIGH (ref 0–200)
HDL: 38.6 mg/dL — ABNORMAL LOW (ref >39.00)
NonHDL: 226.97
Total CHOL/HDL Ratio: 7
Triglycerides: 348 mg/dL — ABNORMAL HIGH (ref 0.0–149.0)
VLDL: 69.6 mg/dL — ABNORMAL HIGH (ref 0.0–40.0)

## 2022-09-05 LAB — CBC
HCT: 47 % (ref 39.0–52.0)
Hemoglobin: 15.8 g/dL (ref 13.0–17.0)
MCHC: 33.6 g/dL (ref 30.0–36.0)
MCV: 93.7 fl (ref 78.0–100.0)
Platelets: 231 10*3/uL (ref 150.0–400.0)
RBC: 5.02 Mil/uL (ref 4.22–5.81)
RDW: 13.1 % (ref 11.5–15.5)
WBC: 5.2 10*3/uL (ref 4.0–10.5)

## 2022-09-05 LAB — COMPREHENSIVE METABOLIC PANEL
ALT: 35 U/L (ref 0–53)
AST: 19 U/L (ref 0–37)
Albumin: 4.9 g/dL (ref 3.5–5.2)
Alkaline Phosphatase: 75 U/L (ref 39–117)
BUN: 17 mg/dL (ref 6–23)
CO2: 26 mEq/L (ref 19–32)
Calcium: 10.1 mg/dL (ref 8.4–10.5)
Chloride: 104 mEq/L (ref 96–112)
Creatinine, Ser: 1.01 mg/dL (ref 0.40–1.50)
GFR: 94.14 mL/min (ref >60.00)
Glucose, Bld: 79 mg/dL (ref 70–99)
Potassium: 4.5 mEq/L (ref 3.5–5.1)
Sodium: 141 mEq/L (ref 135–145)
Total Bilirubin: 0.6 mg/dL (ref 0.2–1.2)
Total Protein: 7.5 g/dL (ref 6.0–8.3)

## 2022-09-05 LAB — HEMOGLOBIN A1C: Hgb A1c MFr Bld: 5.4 % (ref 4.6–6.5)

## 2022-09-05 LAB — LDL CHOLESTEROL, DIRECT: Direct LDL: 149 mg/dL

## 2022-09-05 LAB — TSH: TSH: 1.1 u[IU]/mL (ref 0.35–5.50)

## 2022-09-05 NOTE — Progress Notes (Signed)
Chief Complaint:  Jonathan Strong is a 37 y.o. male who presents today for his annual comprehensive physical exam.    Assessment/Plan:  Chronic Problems Addressed Today: Major depression in remission ([place]) Overall symptoms are stable.  He will continue Celexa 30 mg daily.  Has referral to psychiatry pending.  GAD (generalized anxiety disorder) Has referral to psychiatry pending.  On Celexa 30 mg daily.  Takes Valium 10 mg every 8 hours as needed.  Dyslipidemia Check labs.  Preventative Healthcare: Check labs today including STD screening per patient request.  Up-to-date on tetanus shot.  Due for screening colonoscopy in 2031.  Patient Counseling(The following topics were reviewed and/or handout was given):  -Nutrition: Stressed importance of moderation in sodium/caffeine intake, saturated fat and cholesterol, caloric balance, sufficient intake of fresh fruits, vegetables, and fiber.  -Stressed the importance of regular exercise.   -Substance Abuse: Discussed cessation/primary prevention of tobacco, alcohol, or other drug use; driving or other dangerous activities under the influence; availability of treatment for abuse.   -Injury prevention: Discussed safety belts, safety helmets, smoke detector, smoking near bedding or upholstery.   -Sexuality: Discussed sexually transmitted diseases, partner selection, use of condoms, avoidance of unintended pregnancy and contraceptive alternatives.   -Dental health: Discussed importance of regular tooth brushing, flossing, and dental visits.  -Health maintenance and immunizations reviewed. Please refer to Health maintenance section.  Return to care in 1 year for next preventative visit.     Subjective:  HPI:  He has no acute complaints today. See A/p for status of chronic   Lifestyle Diet: None specific.  Exercise: Active with work.      02/27/2022    4:35 PM  Depression screen PHQ 2/9  Decreased Interest 0  Down, Depressed,  Hopeless 0  PHQ - 2 Score 0    Health Maintenance Due  Topic Date Due   COVID-19 Vaccine (1) Never done   Hepatitis C Screening  Never done     ROS: Per HPI, otherwise a complete review of systems was negative.   PMH:  The following were reviewed and entered/updated in epic: Past Medical History:  Diagnosis Date   Depression    History of chicken pox    History of mononucleosis    treated at [facility] ER 3 weeks ago per patient   Hyperlipidemia    Patient Active Problem List   Diagnosis Date Noted   Sleep-disordered breathing 10/21/2021   Bilateral carpal tunnel syndrome 09/12/2021   Major depression in remission ([place]) 05/24/2020   GAD (generalized anxiety disorder) 05/24/2020   Anger 08/30/2019   Dyslipidemia 08/26/2019   Stress 06/15/2017   Degeneration of lumbar intervertebral disc 03/09/2017   Chronic back pain, Low Back Pain 10/13/2014   Past Surgical History:  Procedure Laterality Date   NO PAST SURGERIES      Family History  Problem Relation Age of Onset   Mental illness Brother    Bipolar disorder Brother    Schizophrenia Father    Rectal cancer Neg Hx    Stomach cancer Neg Hx     Medications- reviewed and updated Current Outpatient Medications  Medication Sig Dispense Refill   citalopram (CELEXA) 20 MG tablet TAKE 1 AND 1/2 TABLETS DAILY BY MOUTH 45 tablet 0   diazepam (VALIUM) 10 MG tablet Take 1 tablet (10 mg total) by mouth every 8 (eight) hours as needed. 30 tablet 3   No current facility-administered medications for this visit.    Allergies-reviewed and updated Allergies  Allergen Reactions   Gabapentin Other (See Comments)    Became suicidal   Hydrocodone    Oxycodone     Social History   Socioeconomic History   Marital status: Married    Spouse name: Not on file   Number of children: 2   Years of education: Not on file   Highest education level: Not on file  Occupational History   Occupation: Architect  Tobacco Use   Smoking  status: Every Day    Packs/day: 0.10    Types: Cigarettes   Smokeless tobacco: Never  Vaping Use   Vaping Use: Never used  Substance and Sexual Activity   Alcohol use: Yes    Alcohol/week: 35.0 standard drinks of alcohol    Types: 14 Cans of beer, 21 Shots of liquor per week   Drug use: No   Sexual activity: Yes    Partners: Female  Other Topics Concern   Not on file  Social History Narrative   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[employer], [employer] - cabinets, [employer]      Home Situation: lives with wife and 2 daughters - 3 yrs and 3 months in 2016      Spiritual Beliefs: none      Lifestyle: no regular exercise; diet is ok      Social Determinants of Radio broadcast assistant Strain: Low Risk  (05/24/2020)   Overall Financial Resource Strain (CARDIA)    Difficulty of Paying Living Expenses: Not very hard  Food Insecurity: No Food Insecurity (02/15/2020)   Hunger Vital Sign    Worried About Running Out of Food in the Last Year: Never true    [place] in the Last Year: Never true  Transportation Needs: No Transportation Needs (02/15/2020)   PRAPARE - Hydrologist (Medical): No    Lack of Transportation (Non-Medical): No  Physical Activity: Inactive (02/15/2020)   Exercise Vital Sign    Days of Exercise per Week: 0 days    Minutes of Exercise per Session: 0 min  Stress: Stress Concern Present (05/24/2020)   [place]    Feeling of Stress : To some extent  Social Connections: Moderately Isolated (05/24/2020)   Social Connection and Isolation Panel [NHANES]    Frequency of Communication with Friends and Family: Twice a week    Frequency of Social Gatherings with Friends and Family: Twice a week    Attends Religious Services: Never    Marine scientist or Organizations: No    Attends Music therapist: Never    Marital Status: Married         Objective:  Physical Exam: BP 122/74   Pulse 80   Temp 97.8 F (36.6 C) (Temporal)   Ht 6' (1.829 m)   Wt 223 lb 12.8 oz (101.5 kg)   SpO2 99%   BMI 30.35 kg/m   Body mass index is 30.35 kg/m. Wt Readings from Last 3 Encounters:  09/05/22 223 lb 12.8 oz (101.5 kg)  08/01/22 228 lb (103.4 kg)  02/27/22 227 lb (103 kg)   Gen: NAD, resting comfortably HEENT: TMs normal bilaterally. OP clear. No thyromegaly noted.  CV: RRR with no murmurs appreciated Pulm: NWOB, CTAB with no crackles, wheezes, or rhonchi GI: Normal bowel sounds present. Soft, Nontender, Nondistended. MSK: no edema, cyanosis, or clubbing noted Skin: warm, dry Neuro: CN2-12 grossly intact. Strength 5/5 in upper and lower extremities.  Reflexes symmetric and intact bilaterally.  Psych: Normal affect and thought content     Hansel Devan M. Jerline Pain, MD 09/05/2022 9:00 AM

## 2022-09-05 NOTE — Assessment & Plan Note (Signed)
Check labs 

## 2022-09-05 NOTE — Patient Instructions (Signed)
It was very nice to see you today!  We will check blood work and a urine sample today.  Please keep up the good work with your diet and exercise.   I will see back in a year for your next physical.  Come back sooner if needed.  Take care, Dr Jerline Pain  PLEASE NOTE:  If you had any lab tests, please let us know if you have not heard back within a few days. You may see your results on mychart before we have a chance to review them but we will give you a call once they are reviewed by [place].   If we ordered any referrals today, please let us know if you have not heard from their office within the next week.   If you had any urgent prescriptions sent in today, please check with the pharmacy within an hour of our visit to make sure the prescription was transmitted appropriately.   Please try these tips to maintain a healthy lifestyle:  Eat at least 3 REAL meals and 1-2 snacks per day.  Aim for no more than 5 hours between eating.  If you eat breakfast, please do so within one hour of getting up.   Each meal should contain half fruits/vegetables, one quarter protein, and one quarter carbs (no bigger than a computer mouse)  Cut down on sweet beverages. This includes juice, soda, and sweet tea.   Drink at least 1 glass of water with each meal and aim for at least 8 glasses per day  Exercise at least 150 minutes every week.    Preventive Care 39-64 Years Old, Male Preventive care refers to lifestyle choices and visits with your health care provider that can promote health and wellness. Preventive care visits are also called wellness exams. What can I expect for my preventive care visit? Counseling During your preventive care visit, your health care provider may ask about your: Medical history, including: Past medical problems. Family medical history. Current health, including: Emotional well-being. Home life and relationship well-being. Sexual activity. Lifestyle, including: Alcohol,  nicotine or tobacco, and drug use. Access to firearms. Diet, exercise, and sleep habits. Safety issues such as seatbelt and bike helmet use. Sunscreen use. Work and work Statistician. Physical exam Your health care provider may check your: Height and weight. These may be used to calculate your BMI (body mass index). BMI is a measurement that tells if you are at a healthy weight. Waist circumference. This measures the distance around your waistline. This measurement also tells if you are at a healthy weight and may help predict your risk of certain diseases, such as type 2 diabetes and high blood pressure. Heart rate and blood pressure. Body temperature. Skin for abnormal spots. What immunizations do I need?  Vaccines are usually given at various ages, according to a schedule. Your health care provider will recommend vaccines for you based on your age, medical history, and lifestyle or other factors, such as travel or where you work. What tests do I need? Screening Your health care provider may recommend screening tests for certain conditions. This may include: Lipid and cholesterol levels. Diabetes screening. This is done by checking your blood sugar (glucose) after you have not eaten for a while (fasting). Hepatitis B test. Hepatitis C test. HIV (human immunodeficiency virus) test. STI (sexually transmitted infection) testing, if you are at risk. Talk with your health care provider about your test results, treatment options, and if necessary, the need for more tests. Follow these  instructions at home: Eating and drinking  Eat a healthy diet that includes fresh fruits and vegetables, whole grains, lean protein, and low-fat dairy products. Drink enough fluid to keep your urine pale yellow. Take vitamin and mineral supplements as recommended by your health care provider. Do not drink alcohol if your health care provider tells you not to drink. If you drink alcohol: Limit how much you  have to 0-2 drinks a day. Know how much alcohol is in your drink. In the [place] one drink equals one 12 oz bottle of beer (355 mL), one 5 oz glass of wine (148 mL), or one 1 oz glass of hard liquor (44 mL). Lifestyle Brush your teeth every morning and night with fluoride toothpaste. Floss one time each day. Exercise for at least 30 minutes 5 or more days each week. Do not use any products that contain nicotine or tobacco. These products include cigarettes, chewing tobacco, and vaping devices, such as e-cigarettes. If you need help quitting, ask your health care provider. Do not use drugs. If you are sexually active, practice safe sex. Use a condom or other form of protection to prevent STIs. Find healthy ways to manage stress, such as: Meditation, yoga, or listening to music. Journaling. Talking to a trusted person. Spending time with friends and family. Minimize exposure to UV radiation to reduce your risk of skin cancer. Safety Always wear your seat belt while driving or riding in a vehicle. Do not drive: If you have been drinking alcohol. Do not ride with someone who has been drinking. If you have been using any mind-altering substances or drugs. While texting. When you are tired or distracted. Wear a helmet and other protective equipment during sports activities. If you have firearms in your house, make sure you follow all gun safety procedures. Seek help if you have been physically or sexually abused. What's next? Go to your health care provider once a year for an annual wellness visit. Ask your health care provider how often you should have your eyes and teeth checked. Stay up to date on all vaccines. This information is not intended to replace advice given to you by your health care provider. Make sure you discuss any questions you have with your health care provider. Document Revised: 01/09/2021 Document Reviewed: 01/09/2021 Elsevier Patient Education  [place].

## 2022-09-05 NOTE — Assessment & Plan Note (Signed)
Overall symptoms are stable.  He will continue Celexa 30 mg daily.  Has referral to psychiatry pending.

## 2022-09-05 NOTE — Assessment & Plan Note (Signed)
Has referral to psychiatry pending.  On Celexa 30 mg daily.  Takes Valium 10 mg every 8 hours as needed.

## 2022-09-05 NOTE — Assessment & Plan Note (Signed)
Following with sports medicine and PT.  Symptoms are improving.

## 2022-09-07 ENCOUNTER — Other Ambulatory Visit: Payer: Self-pay | Admitting: Family Medicine

## 2022-09-07 LAB — RPR: RPR Ser Ql: NONREACTIVE

## 2022-09-07 LAB — HEPATITIS C ANTIBODY: Hepatitis C Ab: NONREACTIVE

## 2022-09-07 LAB — HIV ANTIBODY (ROUTINE TESTING W REFLEX): HIV 1&2 Ab, 4th Generation: NONREACTIVE

## 2022-09-08 LAB — URINE CYTOLOGY ANCILLARY ONLY
Chlamydia: NEGATIVE
Comment: NEGATIVE
Comment: NEGATIVE
Comment: NORMAL
Neisseria Gonorrhea: NEGATIVE
Trichomonas: NEGATIVE

## 2022-09-09 NOTE — Progress Notes (Signed)
Please inform patient of the following:  His cholesterol is still elevated but stable compared to last year.  He does not need to start meds for this but he should continue to work on diet and exercise and we can recheck this again in a year or so.  All of his other test including his STD panel was negative.  We can recheck everything else in a year.

## 2022-09-18 ENCOUNTER — Encounter (HOSPITAL_COMMUNITY): Payer: Self-pay | Admitting: Psychiatry

## 2022-09-18 ENCOUNTER — Ambulatory Visit (HOSPITAL_COMMUNITY): Payer: Commercial Managed Care - HMO | Admitting: Psychiatry

## 2022-09-18 DIAGNOSIS — F325 Major depressive disorder, single episode, in full remission: Secondary | ICD-10-CM

## 2022-09-18 DIAGNOSIS — F411 Generalized anxiety disorder: Secondary | ICD-10-CM
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
## 2022-11-04 ENCOUNTER — Other Ambulatory Visit: Payer: Self-pay | Admitting: Family Medicine

## 2022-11-06 ENCOUNTER — Telehealth (HOSPITAL_BASED_OUTPATIENT_CLINIC_OR_DEPARTMENT_OTHER): Payer: Commercial Managed Care - HMO | Admitting: Psychiatry

## 2022-11-06 ENCOUNTER — Encounter (HOSPITAL_COMMUNITY): Payer: Self-pay | Admitting: Psychiatry

## 2022-11-06 DIAGNOSIS — F411 Generalized anxiety disorder: Secondary | ICD-10-CM

## 2022-11-06 DIAGNOSIS — F325 Major depressive disorder, single episode, in full remission: Secondary | ICD-10-CM

## 2022-11-06 DIAGNOSIS — F3181 Bipolar II disorder: Secondary | ICD-10-CM

## 2022-11-06 MED ORDER — DIVALPROEX SODIUM 500 MG PO DR TAB: 500.0000 mg | DELAYED_RELEASE_TABLET | Freq: Two times a day (BID) | ORAL | 1 refills | Status: DC

## 2022-11-06 NOTE — Progress Notes (Signed)
BH MD/PA/NP OP Progress Note  11/06/2022 10:10 AM Jonathan Strong  MRN:  [id number]  Visit Diagnosis:    ICD-10-CM   1. GAD (generalized anxiety disorder)  F41.1     2. Major depression in remission  F32.5       Assessment: Jonathan Strong is a 37 y.o. male with a history of MDD, GAD, and chronic low back pain who presented [facility] for initial evaluation on 09/18/2022.  During initial evaluation patient reported a history of anxiety, depression, mood lability, and anger which is currently well-controlled.  He did endorse a past history of potential bipolar 2 disorder with periods of questionable hypomania followed by depression. Initially this dx was in question as he had not had progression to mania since starting Celexa.  However patient later experienced mood swings with a significant period of irritability and depression making suicidal statements.  This resulted in police being called to the house though did not escalate any further.  Per patient and collateral obtained from a family friend, with patient's permission, these episodes have been occurring every few months and he seems to cycle between ups and downs.  The symptoms in conjunction with the patient's father's history of schizophrenia/bipolar disorder make the diagnosis of bipolar 2 disorder more likely.  At this time patient meets criteria for bipolar 2 disorder and generalized anxiety disorder.  In the interim patient had an episode of depressed mood and SI resulting in police being called. Upon arrival he denied any SI and police did not feel an IVC was appropriate.   Jonathan Strong presents for follow-up evaluation. Today, 11/06/22, patient reports doing better today but does acknowledge periods of increased depression and irritability over the past week.  These episodes have been of significant concern towards him and he did endorse SI at one point without any intent or  plan.  Of note patient had discontinued Valium a couple days before the episode which could have contributed to the increased irritability.  Based off of patient's continued mood fluctuations and increased irritability we will consider diagnosis of bipolar disorder.  We will start patient on Depakote 500 mg twice a day and instructed him to get Depakote level in 2 weeks.  Risk and benefits of the medication were discussed and we will follow up in 3 weeks.   Plan: - Continue Celexa 30 mg QD - Start Depakote 500 mg BID  - Depakote level ordered for 2 weeks - Continue Valium 10 mg TID prn for back pain, takes once a day managed by PCP - Continue therapy regularly in addition to couples therapy with the same provider. - CMP, CBC, lipid profile, A1c, and TSH reviewed - Crisis resources reviewed - Follow up in 3 weeks   Chief Complaint:  Chief Complaint  Patient presents with   Follow-up   HPI: In the interim this provider had been contacted by a therapist who is a family friend of the patient. She had reached out with the patient permission and provided collateral on the patient. She was informed that without an ROI we could neither confirm or deny whether the patient is seen here nor could we provide any information regarding their care, but could listen to information she provided. She expressed her understanding and shared her knowledge of the patient. Per her report patient had a suicidal episode on 4/5 where police were called. When they arrived he denied SI and they reported being unable  to IVC the patient. Per the therapist providing information the patient hs cycling in his mood symptoms every 2-3 months that he can minimize. During the cycles he can swing from depressed to irritable and aggressive. She also disclosed that patients father had schizophrenia as previously reported by patient, but also mentioned that he had multiple suicide attempts as well.   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  He has not had a day off in over a month.  Patient and wife went to the therapist on Friday and had a bad argument over money. The rest of the family had gone on vacation and had complained about money during the session saying that he was not making enough. This really struck a nerve with him, as the idea of him not making enough is a trigger for him. After that he flipped out, was raging mad, had thoughts of putting a gun in his mouth at the therapy office. He denies any plan or intent to act on that. After coming home some friends came over and talked him through it. His mom came over that night as well. Over the weekend he and his wife made up. Patient is aware the family friend, who is a therapist, reached out to this provider and he expressed that he was alright with her providing information about his hx. On Monday they had some other friends over, one of whom was saying things that rubbed him the wrong way. There was an incident where he lost control of the dog and the leash hit this person in the face. From there it quickly escalated where he was verbally aggressive yelling and cussing her out in a matter of seconds. Patient is concerned by these rapid mood swings and had talked to his mom. She mentioned that his fathers behavior was really similar.   Patient feels with all this stress financially, and with his relationships with his mother in law have been really tough to handle lately. The mother in law is also living in a house that they could be renting instead which is another annoyance for him.   Of note patient also reported that last Wednesday he stopped taking his Valium and did not restart it again until yesterday.  He had been taking 10 mg once a day.  Jonathan Strong reports that he stopped medication as he had noticed his memory getting worse as evidenced by pouring his orange juice into his coffee and he attributed  this to the Valium.  We did review that while Valium can affect memory depression also plays a role in memory and concentration symptoms.  Patient was cautioned on stopping medications like Valium suddenly due to withdrawal side effects including irritability, restlessness, physical discomfort, nausea, and potential seizures.  The increased irritability could be attributed to patient's outburst on Friday and Monday.  Based off of collateral obtained from family friend and patient's report today it is appearing more likely the patient does have a bipolar diagnosis, and that his symptoms are not adequately controlled on his current medication regimen.  That indicates we recommended starting on mood stabilizing medications such as Depakote to help with these mood swings and control irritability.  Risk and benefits of Depakote were discussed and he was started on 500 mg twice a day.  Patient was instructed to get a Depakote level drawn in 2 weeks.  He plans to reach out to his  PCP at [facility] to do so.  We also did discuss the therapy situation as that could be accomplished interest with the therapist managing the therapy for both the patient, his wife, and couples therapy.  Patient acknowledges this but does not feel his therapist is a good fit and would like to continue on with him.  Past Psychiatric History: Patient reports being connected with a therapist in the past and currently sees a therapist for both individual and couples counseling.  Denies connecting with any psychiatrist in the past has been prescribed medications through his PCP.  Patient denies any psychiatric hospitalizations or prior suicide attempts.  Has tried Abilify, Prozac, and gabapentin in the past. Currently on a regimen of Celexa and Valium  Substance use: Uses Alcohol 3-5 beers a day. Working to cut down.  Counseling provided for alcohol cessation.  Denies marijuana use. Has used psilocybin once or twice in the past. Denies any other  substance use  Past Medical History:  Past Medical History:  Diagnosis Date   Depression    History of chicken pox    History of mononucleosis    treated at [facility] 3 weeks ago per patient   Hyperlipidemia     Past Surgical History:  Procedure Laterality Date   NO PAST SURGERIES     Family History:  Family History  Problem Relation Age of Onset   Mental illness Brother    Bipolar disorder Brother    Schizophrenia Father    Rectal cancer Neg Hx    Stomach cancer Neg Hx     Social History:  Social History   Socioeconomic History   Marital status: Married    Spouse name: Not on file   Number of children: 2   Years of education: Not on file   Highest education level: Not on file  Occupational History   Occupation: Holiday representative  Tobacco Use   Smoking status: Every Day    Packs/day: .1    Types: Cigarettes   Smokeless tobacco: Never  Vaping Use   Vaping Use: Never used  Substance and Sexual Activity   Alcohol use: Yes    Alcohol/week: 35.0 standard drinks of alcohol    Types: 14 Cans of beer, 21 Shots of liquor per week   Drug use: No   Sexual activity: Yes    Partners: Female  Other Topics Concern   Not on file  Social History Narrative   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[employer], Surveyor, mining - cabinets, custom      Home Situation: lives with wife and 2 daughters - 3 yrs and 3 months in 2016      Spiritual Beliefs: none      Lifestyle: no regular exercise; diet is ok      Social Determinants of Corporate investment banker Strain: Low Risk  (05/24/2020)   Overall Financial Resource Strain (CARDIA)    Difficulty of Paying Living Expenses: Not very hard  Food Insecurity: No Food Insecurity (02/15/2020)   Hunger Vital Sign    Worried About Running Out of Food in the Last Year: Never true    Ran Out of Food in the Last Year: Never true  Transportation Needs: No Transportation Needs (02/15/2020)   PRAPARE - Administrator, Civil Service  (Medical): No    Lack of Transportation (Non-Medical): No  Physical Activity: Inactive (02/15/2020)   Exercise Vital Sign    Days of Exercise per Week: 0 days    Minutes  of Exercise per Session: 0 min  Stress: Stress Concern Present (05/24/2020)   [employer] of Occupational Health - Occupational Stress Questionnaire    Feeling of Stress : To some extent  Social Connections: Moderately Isolated (05/24/2020)   Social Connection and Isolation Panel [NHANES]    Frequency of Communication with Friends and Family: Twice a week    Frequency of Social Gatherings with Friends and Family: Twice a week    Attends Religious Services: Never    Database administrator or Organizations: No    Attends Banker Meetings: Never    Marital Status: Married    Allergies:  Allergies  Allergen Reactions   Gabapentin Other (See Comments)    Became suicidal   Hydrocodone    Oxycodone     Current Medications: Current Outpatient Medications  Medication Sig Dispense Refill   citalopram (CELEXA) 20 MG tablet TAKE 1 AND 1/2 TABLETS DAILY BY MOUTH 45 tablet 1   diazepam (VALIUM) 10 MG tablet Take 1 tablet (10 mg total) by mouth every 8 (eight) hours as needed. 30 tablet 3   No current facility-administered medications for this visit.     Psychiatric Specialty Exam: Review of Systems  There were no vitals taken for this visit.There is no height or weight on file to calculate BMI.  General Appearance: Fairly Groomed  Eye Contact:  Fair  Speech:  Clear and Coherent and Normal Rate  Volume:  Normal  Mood:  Euthymic and Irritable  Affect:  Congruent and has been labile in the interim  Thought Process:  Coherent  Orientation:  Full (Time, Place, and Person)  Thought Content: Logical   Suicidal Thoughts:   Denies currently though did have intermittent SI without intent or plan last Friday  Homicidal Thoughts:  No  Memory:  NA  Judgement:  Fair  Insight:  Fair  Psychomotor Activity:   Normal  Concentration:  Concentration: Fair  Recall:  Fair  Fund of Knowledge: Fair  Language: Good  Akathisia:  NA    AIMS (if indicated): not done  Assets:  Communication Skills Desire for Improvement Housing Talents/Skills Transportation Vocational/Educational  ADL's:  Intact  Cognition: WNL  Sleep:  Fair   Metabolic Disorder Labs: Lab Results  Component Value Date   HGBA1C 5.4 09/05/2022   No results found for: "PROLACTIN" Lab Results  Component Value Date   CHOL 266 (H) 09/05/2022   TRIG 348.0 (H) 09/05/2022   HDL 38.60 (L) 09/05/2022   CHOLHDL 7 09/05/2022   VLDL 69.6 (H) 09/05/2022   Lab Results  Component Value Date   TSH 1.10 09/05/2022   TSH 1.00 08/30/2021    Therapeutic Level Labs: No results found for: "LITHIUM" No results found for: "VALPROATE" No results found for: "CBMZ"   Screenings: AUDIT    Flowsheet Row Counselor from 05/24/2020 in [facility] Counselor from 02/15/2020 in [facility]  Alcohol Use Disorder Identification Test Final Score (AUDIT) 16 10      GAD-7    Flowsheet Row [facility] Counselor from 05/24/2020 in [facility] Counselor from 02/15/2020 in [facility]  Total GAD-7 Score 0 2 15      PHQ2-9    Flowsheet Row Office Visit from 09/18/2022 in [facility] Office Visit from 09/05/2022 in [facility] Office Visit from 02/27/2022 in [facility]  from 10/21/2021 in [facility] from 08/30/2021 in [facility]  PHQ-2 Total Score 1 1 0 0 0  PHQ-9 Total Score 4 4 -- 2 --      Flowsheet Row Office Visit from 09/18/2022 in [facility] from 02/08/2021 in [facility])  C-SSRS RISK CATEGORY No Risk No Risk       Collaboration of Care: Collaboration of Care: Medication Management AEB medication prescription and Community Stakeholder(s) AEB collateral from therapist/family friend  Patient/Guardian was advised Release of Information must be obtained prior to any record release in order to collaborate their care with an outside provider. Patient/Guardian was advised if they have not already done so to contact the registration department to sign all necessary forms in order for [place] to release information regarding their care.   Consent: Patient/Guardian gives verbal consent for treatment and assignment of benefits for services provided during this visit. Patient/Guardian expressed understanding and agreed to proceed.    Stasia Cavalier, MD 11/06/2022, 10:10 AM   Virtual Visit via Video Note  I connected with Jonathan Strong on 11/06/22 at 10:00 AM EDT by a video enabled telemedicine application and verified that I am speaking with the correct person using two identifiers.  Location: Patient: Home Provider: Home Office   I discussed the limitations of evaluation and management by telemedicine and the availability of in person appointments. The patient expressed understanding and agreed to proceed.   I discussed the assessment and treatment plan with the patient. The patient was provided an opportunity to ask questions and all were answered. The patient agreed with the plan and demonstrated an understanding of the instructions.   The patient was advised to call back or seek an in-person evaluation if the symptoms worsen or if the condition fails to improve as anticipated.  I provided 30 minutes of non-face-to-face time during this encounter.   Stasia Cavalier, MD

## 2022-11-13 ENCOUNTER — Encounter: Payer: Self-pay | Admitting: Family Medicine

## 2022-11-14 NOTE — Telephone Encounter (Signed)
Recommend he get in contact with psychiatry - they are the ones managing his depakote.   [name]. [name], MD 11/14/2022 7:35 AM

## 2022-11-18 ENCOUNTER — Telehealth: Payer: Self-pay | Admitting: Family Medicine

## 2022-11-18 NOTE — Telephone Encounter (Signed)
Patient called to request a referral to a specialist for his back pain. Pt did see Dr. Jon Billings on 08/01/22 for this issue and was referred to Orthoped[facility] [place], [place])  and Sports medicine, Dr. Berline Chough. Pt states these referrals did not help him.   Patient has been scheduled for 4/25 @ 9am just in case. Please Advise any further action.

## 2022-11-19 NOTE — Telephone Encounter (Signed)
Ok with me. Please place any necessary orders. 

## 2022-11-20 ENCOUNTER — Encounter: Payer: Self-pay | Admitting: Family Medicine

## 2022-11-20 ENCOUNTER — Ambulatory Visit (INDEPENDENT_AMBULATORY_CARE_PROVIDER_SITE_OTHER): Payer: Commercial Managed Care - HMO | Admitting: Family Medicine

## 2022-11-20 VITALS — BP 113/74 | HR 70 | Temp 97.7°F | Ht 72.0 in | Wt 221.2 lb

## 2022-11-20 DIAGNOSIS — M545 Low back pain, unspecified: Secondary | ICD-10-CM | POA: Diagnosis not present

## 2022-11-20 DIAGNOSIS — F411 Generalized anxiety disorder: Secondary | ICD-10-CM | POA: Diagnosis not present

## 2022-11-20 DIAGNOSIS — G8929 Other chronic pain: Secondary | ICD-10-CM | POA: Diagnosis not present

## 2022-11-20 DIAGNOSIS — F325 Major depressive disorder, single episode, in full remission: Secondary | ICD-10-CM | POA: Diagnosis not present

## 2022-11-20 MED ORDER — TIZANIDINE HCL 4 MG PO TABS: 4.0000 mg | ORAL_TABLET | Freq: Four times a day (QID) | ORAL | 0 refills | Status: DC | PRN

## 2022-11-20 MED ORDER — MELOXICAM 15 MG PO TABS: 15.0000 mg | ORAL_TABLET | Freq: Every day | ORAL | 0 refills | Status: DC

## 2022-11-20 NOTE — Assessment & Plan Note (Signed)
Not controlled.  He has had a trial of physical therapy without much improvement.  We will start Zanaflex and meloxicam.  We discussed side effects.  He has not had much benefit with prednisone or steroid injections in the past and declined starting these today.  He has not had much benefit with other NSAIDs either.   Probably needs imaging at this point.  Will refer to orthopedics for further evaluation and management.  Discussed reasons to return to care and seek emergent care.

## 2022-11-20 NOTE — Progress Notes (Signed)
   Jonathan Strong is a 37 y.o. male who presents today for an office visit.  Assessment/Plan:  Chronic Problems Addressed Today: Chronic back pain, Low Back Pain Not controlled.  He has had a trial of physical therapy without much improvement.  We will start Zanaflex and meloxicam.  We discussed side effects.  He has not had much benefit with prednisone or steroid injections in the past and declined starting these today.  He has not had much benefit with other NSAIDs either.   Probably needs imaging at this point.  Will refer to orthopedics for further evaluation and management.  Discussed reasons to return to care and seek emergent care.  Major depression in remission [facility]) Follows with psychiatry.  He stopped taking his Depakote.  Now on Celexa 30 mg daily.  No reported SI or HI.  GAD (generalized anxiety disorder) Follows with psychiatry.  On Celexa 30 mg daily as above.     Subjective:  HPI:  See A/p for status of chronic conditions,  Main concern today is persistent back pain.  This has been going on for quite a while.  He had an injury a few months ago.  He has been seeing sports medicine for this.  Dry needling has given only modest benefit.  Had a course of physical therapy with mild improvement.  He occasionally takes Valium but is not sure if this is helping any longer.  He was seen here few months ago for this by different provider.  Given Toradol injection which did not help.  Symptoms seem to be worsening.  Is having more sciatica symptoms down his left leg as well.  He would like to be referred to see an orthopedist.       Objective:  Physical Exam: BP 113/74   Pulse 70   Temp 97.7 F (36.5 C) (Temporal)   Ht 6' (1.829 m)   Wt 221 lb 3.2 oz (100.3 kg)   SpO2 98%   BMI 30.00 kg/m   Gen: No acute distress, resting comfortably Neuro: Grossly normal, moves all extremities Psych: Normal affect and thought content      Caellum Mancil M. [name], MD 11/20/2022 10:10 AM

## 2022-11-20 NOTE — Assessment & Plan Note (Signed)
Follows with psychiatry.  He stopped taking his Depakote.  Now on Celexa 30 mg daily.  No reported SI or HI.

## 2022-11-20 NOTE — Assessment & Plan Note (Signed)
Follows with psychiatry.  On Celexa 30 mg daily as above.

## 2022-11-20 NOTE — Patient Instructions (Signed)
It was very nice to see you today!  We will start Zanaflex and meloxicam.  We will refer you to see the orthopedic.  Take care, Dr [name]  PLEASE NOTE:  If you had any lab tests, please let us know if you have not heard back within a few days. You may see your results on mychart before we have a chance to review them but we will give you a call once they are reviewed by [place].   If we ordered any referrals today, please let us know if you have not heard from their office within the next week.   If you had any urgent prescriptions sent in today, please check with the pharmacy within an hour of our visit to make sure the prescription was transmitted appropriately.   Please try these tips to maintain a healthy lifestyle:  Eat at least 3 REAL meals and 1-2 snacks per day.  Aim for no more than 5 hours between eating.  If you eat breakfast, please do so within one hour of getting up.   Each meal should contain half fruits/vegetables, one quarter protein, and one quarter carbs (no bigger than a computer mouse)  Cut down on sweet beverages. This includes juice, soda, and sweet tea.   Drink at least 1 glass of water with each meal and aim for at least 8 glasses per day  Exercise at least 150 minutes every week.

## 2022-11-21 ENCOUNTER — Encounter: Payer: Self-pay | Admitting: Orthopedic Surgery

## 2022-11-21 ENCOUNTER — Ambulatory Visit (INDEPENDENT_AMBULATORY_CARE_PROVIDER_SITE_OTHER): Payer: Commercial Managed Care - HMO | Admitting: Orthopedic Surgery

## 2022-11-21 ENCOUNTER — Other Ambulatory Visit (INDEPENDENT_AMBULATORY_CARE_PROVIDER_SITE_OTHER): Payer: Commercial Managed Care - HMO

## 2022-11-21 VITALS — BP 111/73 | HR 57 | Ht 72.0 in | Wt 225.0 lb

## 2022-11-21 DIAGNOSIS — M5416 Radiculopathy, lumbar region: Secondary | ICD-10-CM

## 2022-11-21 DIAGNOSIS — M545 Low back pain, unspecified: Secondary | ICD-10-CM | POA: Diagnosis not present

## 2022-11-21 DIAGNOSIS — G8929 Other chronic pain: Secondary | ICD-10-CM

## 2022-11-21 MED ORDER — PREGABALIN 75 MG PO CAPS: 75.0000 mg | ORAL_CAPSULE | Freq: Two times a day (BID) | ORAL | 0 refills | Status: DC

## 2022-11-21 NOTE — Progress Notes (Signed)
Orthopedic Spine Surgery Office Note  Assessment: Patient is a 38 y.o. male with low back pain that radiates into the left buttock, left posterior thigh, left posterolateral leg, and lateral foot.  Possible left-sided radiculopathy, suspect L5 or S1   Plan: -Explained that initially conservative treatment is tried as a significant number of patients may experience relief with these treatment modalities. Discussed that the conservative treatments include:  -activity modification  -physical therapy  -over the counter pain medications  -medrol dosepak  -lumbar steroid injections -Patient has tried PT, NSAIDs, Tylenol, Valium, oral steroids, steroid injections -Since he has tried conservative treatments for over 3 months now without any significant relief, recommended MRI of the lumbar spine to evaluate for radiculopathy -Prescribed Lyrica to provide him with additional pain relief -Explained that his left groin pain is likely coming from the hip -Would need to be nicotine free prior to any elective spine surgery -Patient should return to office in 5 weeks, x-rays at next visit: None   Patient expressed understanding of the plan and all questions were answered to the patient's satisfaction.   ___________________________________________________________________________   History:  Patient is a 37 y.o. male who presents today for lumbar spine.  Patient has had several years of on and off back pain.  He states that he will of episodes where his back gives out and he will develop pain into his left leg.  He says in the past has been told he has bulging discs.  Most of his episodes of pain resolved with time.  However, he has now developed pain in his low back going down his left lower extremity.  He feels it in the left buttock, left posterior thigh, left posterolateral leg, and the lateral aspect of the foot.  This pain has been present since January 2024.  It has gotten progressively worse with  time.  He has tried multiple conservative treatments but it has not gotten any better.  Usually, his pain gets better quicker than this during prior episodes.  He has noticed atrophy of his left calf which has been present for several years.  Feels his left leg is slightly weaker.  There is no trauma or injury preceding the onset of his pain in January 2024.   Weakness: Yes, his left leg particular the foot and ankle feel weaker than the contralateral side.  No other weakness noted Symptoms of imbalance: Denies Paresthesias and numbness: Denies Bowel or bladder incontinence: Denies Saddle anesthesia: Denies  Treatments tried: PT, NSAIDs, Tylenol, Valium, oral steroids, steroid injections  Review of systems: Denies fevers and chills, night sweats, unexplained weight loss, history of cancer.  Has had pain that wakes him at night  Past medical history: Hyperlipidemia Depression Chronic pain  Allergies: Narcotics, gabapentin  Past surgical history:  None  Social history: Reports use of nicotine product (smoking, vaping, patches, smokeless) Alcohol use: Yes, about 7 drinks per week Denies recreational drug use   Physical Exam:  General: no acute distress, appears stated age Neurologic: alert, answering questions appropriately, following commands Respiratory: unlabored breathing on room air, symmetric chest rise Psychiatric: appropriate affect, normal cadence to speech   MSK (spine):  -Strength exam      Left  Right EHL    4/5  5/5 TA    5/5  5/5 GSC    5/5  5/5 Knee extension  5/5  5/5 Hip flexion   5/5  5/5  -Sensory exam    Sensation intact to light touch in L3-S1 nerve  distributions of bilateral lower extremities  -Achilles DTR: 2/4 on the left, 2/4 on the right -Patellar tendon DTR: 2/4 on the left, 2/4 on the right  -Straight leg raise: Positive bilaterally -Femoral nerve stretch test: Negative bilaterally -Clonus: no beats bilaterally  -Left hip exam:  Positive FADIR, no pain through remainder of range of motion, negative Stinchfield, negative FABER -Right hip exam: No pain through range of motion, negative Stinchfield, negative FABER  Imaging: XR of the lumbar spine from 11/21/2022 was independently reviewed and interpreted, showing transitional anatomy.  There is disc height loss at the lowest complete disc which I am calling L5/S1.  No fracture or dislocation seen.  No evidence of instability on flexion/extension views.   Patient name: Jonathan Strong Patient MRN: [id number] Date of visit: 11/21/22

## 2022-11-24 NOTE — Telephone Encounter (Signed)
Patient states that neither sports med or ortho appts helped with back pain. Wanting to know what else can be done. Please advise

## 2022-11-24 NOTE — Telephone Encounter (Signed)
We can try sending [facility] to [facility] if he wishes.  He needs to see a specialist at this point. I will defer speciality care to the specialists.  [name]. [name], MD 11/24/2022 11:12 AM

## 2022-11-25 NOTE — Telephone Encounter (Signed)
Spoke with patient stated had an Ov with ortho care  Ortho care recommended an MRI, patient waiting for MRI appt  Advise to call ortho care for MRI appt information

## 2022-12-02 ENCOUNTER — Encounter (HOSPITAL_COMMUNITY): Payer: Self-pay | Admitting: Psychiatry

## 2022-12-02 ENCOUNTER — Telehealth (HOSPITAL_BASED_OUTPATIENT_CLINIC_OR_DEPARTMENT_OTHER): Payer: Commercial Managed Care - HMO | Admitting: Psychiatry

## 2022-12-02 DIAGNOSIS — F3181 Bipolar II disorder: Secondary | ICD-10-CM | POA: Diagnosis not present

## 2022-12-02 DIAGNOSIS — F411 Generalized anxiety disorder: Secondary | ICD-10-CM | POA: Diagnosis not present

## 2022-12-02 MED ORDER — CITALOPRAM HYDROBROMIDE 20 MG PO TABS
ORAL_TABLET | ORAL | 1 refills | Status: DC
Start: 2022-12-02 — End: 2023-01-23

## 2022-12-02 MED ORDER — OXCARBAZEPINE 300 MG PO TABS
300.0000 mg | ORAL_TABLET | Freq: Every evening | ORAL | 1 refills | Status: DC
Start: 2022-12-02 — End: 2023-01-23

## 2022-12-02 NOTE — Progress Notes (Signed)
BH MD/PA/NP OP Progress Note  12/02/2022 2:09 PM Jonathan Strong  MRN:  [id number]  Visit Diagnosis:    ICD-10-CM   1. GAD (generalized anxiety disorder)  F41.1 citalopram (CELEXA) 20 MG tablet    2. Bipolar 2 disorder (HCC)  F31.81 Oxcarbazepine (TRILEPTAL) 300 MG tablet      Assessment: Jonathan Strong is a 37 y.o. male with a history of MDD, GAD, and chronic low back pain who presented to [facility] for initial evaluation on 09/18/2022.  During initial evaluation patient reported a history of anxiety, depression, mood lability, and anger which is currently well-controlled.  He did endorse a past history of potential bipolar 2 disorder with periods of questionable hypomania followed by depression. Initially this dx was in question as he had not had progression to mania since starting Celexa.  However patient later experienced mood swings with a significant period of irritability and depression making suicidal statements.  This resulted in police being called to the house though did not escalate any further.  Per patient and collateral obtained from a family friend, with patient's permission, these episodes have been occurring every few months and he seems to cycle between ups and downs.  The symptoms in conjunction with the patient's father's history of schizophrenia/bipolar disorder make the diagnosis of bipolar 2 disorder more likely.  At this time patient meets criteria for bipolar 2 disorder and generalized anxiety disorder.  [name] presents for follow-up evaluation. Today, 12/02/22, patient reports doing alright over the past month. He took Depakote for a week, however found it oversedating and discontinued at that point. Mood has remained stable without periods of increased irritability or depressive episodes, though he does endorse feeling frustrated at times. This is often secondary to interpersonal interactions with his wife and  mother in law. The frequency of these stressors had decreased over the past month as his wife had been away which could contribute to the mood stability. We will start Trileptal 300 mg QHS today and reviewed the risks and side effects. Patient was reminded to reach out to the office if he experienced side effects or wants to discontinue the medication.   Plan: - Continue Celexa 30 mg QD - Start Trileptal 300 mg QHS - Discontinue Depakote 500 mg BID, excessive emotional flattening - Discontinue Valium 10 mg TID managed by PCP, per patient he is no longer taking - Continue Lyrica 75 mg BID managed by pain specialist, patient taking once a day - Continue therapy regularly in addition to couples therapy with the same provider. - CMP, CBC, lipid profile, A1c, and TSH reviewed - Crisis resources reviewed - Follow up in 3 weeks   Chief Complaint:  Chief Complaint  Patient presents with   Follow-up   HPI: Patient reports that he tried the Depakote and felt that he was overly stabilized/sedated on the medication. Jonathan Strong reports that he was unable to feel positive emotions or laughter leading to him discontinuing the medication after  a week .He describes that he felt pointless on the medication and oversedated. Since discontinuing the Depakote Jonathan Strong reports that he has also discontinued Valium.  Since making these changes he reports that he is feeling a bit better, and that his mood remains stable. Jonathan Strong does acknowledge that the mood swings had not been occurring every month and instead closer to every 2-3 months which could also explain the stable mood. He also noted that he was watching his mom's  place during this time and his wife went on vacation so he had was around her less. He acknowledges that his wife can be a stressor and trigger, as she can tell him no or frustrate him. Jonathan Strong also mentioned again that his coping strategy is to take space when he gets frustrated which his wife does not always  accommodate. After discussing this we recommended trying an alternative mood stabilizing medication. Patient expressed some hesitancy due to concern about oversedation again, but was open to trying the medication. He was also open to continue with therapy and encouraged to discuss some of his concerns both in individual therapy and couples counseling.   Past Psychiatric History: Patient reports being connected with a therapist in the past and currently sees a therapist for both individual and couples counseling.  Denies connecting with any psychiatrist in the past has been prescribed medications through his PCP.  Patient denies any psychiatric hospitalizations or prior suicide attempts.  Has tried Abilify, Prozac, Depakote (over sedating), Valium, and gabapentin in the past. Currently on a regimen of Celexa and Valium  Substance use: Uses Alcohol 3-5 beers a day. Working to cut down.  Counseling provided for alcohol cessation.  Denies marijuana use. Has used psilocybin once or twice in the past. Denies any other substance use  Past Medical History:  Past Medical History:  Diagnosis Date   Depression    History of chicken pox    History of mononucleosis    treated at [facility] 3 weeks ago per patient   Hyperlipidemia     Past Surgical History:  Procedure Laterality Date   NO PAST SURGERIES     Family History:  Family History  Problem Relation Age of Onset   Mental illness Brother    Bipolar disorder Brother    Schizophrenia Father    Rectal cancer Neg Hx    Stomach cancer Neg Hx     Social History:  Social History   Socioeconomic History   Marital status: Married    Spouse name: Not on file   Number of children: 2   Years of education: Not on file   Highest education level: Not on file  Occupational History   Occupation: Holiday representative  Tobacco Use   Smoking status: Every Day    Packs/day: .1    Types: Cigarettes   Smokeless tobacco: Never  Vaping Use   Vaping Use: Never  used  Substance and Sexual Activity   Alcohol use: Yes    Alcohol/week: 35.0 standard drinks of alcohol    Types: 14 Cans of beer, 21 Shots of liquor per week   Drug use: No   Sexual activity: Yes    Partners: Female  Other Topics Concern   Not on file  Social History Narrative   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[employer], Surveyor, mining - cabinets, custom      Home Situation: lives with wife and 2 daughters - 3 yrs and 3 months in 2016      Spiritual Beliefs: none      Lifestyle: no regular exercise; diet is ok      Social Determinants of Corporate investment banker Strain: Low Risk  (05/24/2020)   Overall Financial Resource Strain (CARDIA)    Difficulty of Paying Living Expenses: Not very hard  Food Insecurity: No Food Insecurity (02/15/2020)   Hunger Vital Sign    Worried About Running Out of Food in the Last Year: Never true    Ran Out of  Food in the Last Year: Never true  Transportation Needs: No Transportation Needs (02/15/2020)   PRAPARE - Administrator, Civil Service (Medical): No    Lack of Transportation (Non-Medical): No  Physical Activity: Inactive (02/15/2020)   Exercise Vital Sign    Days of Exercise per Week: 0 days    Minutes of Exercise per Session: 0 min  Stress: Stress Concern Present (05/24/2020)   [employer] of Occupational Health - Occupational Stress Questionnaire    Feeling of Stress : To some extent  Social Connections: Moderately Isolated (05/24/2020)   Social Connection and Isolation Panel [NHANES]    Frequency of Communication with Friends and Family: Twice a week    Frequency of Social Gatherings with Friends and Family: Twice a week    Attends Religious Services: Never    Database administrator or Organizations: No    Attends Banker Meetings: Never    Marital Status: Married    Allergies:  Allergies  Allergen Reactions   Gabapentin Other (See Comments)    Became suicidal   Hydrocodone    Oxycodone      Current Medications: Current Outpatient Medications  Medication Sig Dispense Refill   Oxcarbazepine (TRILEPTAL) 300 MG tablet Take 1 tablet (300 mg total) by mouth every evening. 30 tablet 1   citalopram (CELEXA) 20 MG tablet TAKE 1 AND 1/2 TABLETS DAILY BY MOUTH 45 tablet 1   diazepam (VALIUM) 10 MG tablet Take 1 tablet (10 mg total) by mouth every 8 (eight) hours as needed. 30 tablet 3   meloxicam (MOBIC) 15 MG tablet Take 1 tablet (15 mg total) by mouth daily. 30 tablet 0   pregabalin (LYRICA) 75 MG capsule Take 1 capsule (75 mg total) by mouth 2 (two) times daily. 60 capsule 0   tiZANidine (ZANAFLEX) 4 MG tablet Take 1 tablet (4 mg total) by mouth every 6 (six) hours as needed for muscle spasms. 30 tablet 0   No current facility-administered medications for this visit.     Psychiatric Specialty Exam: Review of Systems  There were no vitals taken for this visit.There is no height or weight on file to calculate BMI.  General Appearance: Fairly Groomed  Eye Contact:  Fair  Speech:  Clear and Coherent and Normal Rate  Volume:  Normal  Mood:  Euthymic and Irritable  Affect:  Congruent  Thought Process:  Coherent  Orientation:  Full (Time, Place, and Person)  Thought Content: Logical   Suicidal Thoughts:  No  Homicidal Thoughts:  No  Memory:  NA  Judgement:  Fair  Insight:  Fair  Psychomotor Activity:  Normal  Concentration:  Concentration: Fair  Recall:  Fair  Fund of Knowledge: Fair  Language: Good  Akathisia:  NA    AIMS (if indicated): not done  Assets:  Communication Skills Desire for Improvement Housing Talents/Skills Transportation Vocational/Educational  ADL's:  Intact  Cognition: WNL  Sleep:  Fair   Metabolic Disorder Labs: Lab Results  Component Value Date   HGBA1C 5.4 09/05/2022   No results found for: "PROLACTIN" Lab Results  Component Value Date   CHOL 266 (H) 09/05/2022   TRIG 348.0 (H) 09/05/2022   HDL 38.60 (L) 09/05/2022   CHOLHDL 7  09/05/2022   VLDL 69.6 (H) 09/05/2022   Lab Results  Component Value Date   TSH 1.10 09/05/2022   TSH 1.00 08/30/2021    Therapeutic Level Labs: No results found for: "LITHIUM" No results found for: "VALPROATE" No results  found for: "CBMZ"   Screenings: AUDIT    Advertising copywriter from 05/24/2020 in [facility] Counselor from 02/15/2020 in [facility]  Alcohol Use Disorder Identification Test Final Score (AUDIT) 16 10      GAD-7    Flowsheet Row Office Visit from 09/18/2022 in [facility] from 05/24/2020 in [facility] Counselor from 02/15/2020 in [facility]  Total GAD-7 Score 0 2 15      PHQ2-9    Flowsheet Row Office Visit from 11/20/2022 in [facility] Office Visit from 09/18/2022 in [facility] Office Visit from 09/05/2022 in [facility] Office Visit from 02/27/2022 in [facility] Visit from 10/21/2021 in [facility]  PHQ-2 Total Score 4 1 1  0 0  PHQ-9 Total Score 19 4 4  -- 2      Flowsheet Row [facility] from 02/08/2021 in [facility])  C-SSRS RISK CATEGORY No Risk No Risk       Collaboration of Care: Collaboration of Care: Medication Management AEB medication prescription and Primary Care Provider AEB chart review  Patient/Guardian was advised Release of Information must be obtained prior to any record release in order to collaborate their care with an outside provider. Patient/Guardian was advised if they have not already done so to contact the registration department to sign all necessary forms in order for [place] to release information regarding their  care.   Consent: Patient/Guardian gives verbal consent for treatment and assignment of benefits for services provided during this visit. Patient/Guardian expressed understanding and agreed to proceed.    Stasia Cavalier, MD 12/02/2022, 2:09 PM   Virtual Visit via Video Note  I connected with Delfino Lovett on 12/02/22 at  9:30 AM EDT by a video enabled telemedicine application and verified that I am speaking with the correct person using two identifiers.  Location: Patient: Home Provider: Home Office   I discussed the limitations of evaluation and management by telemedicine and the availability of in person appointments. The patient expressed understanding and agreed to proceed.   I discussed the assessment and treatment plan with the patient. The patient was provided an opportunity to ask questions and all were answered. The patient agreed with the plan and demonstrated an understanding of the instructions.   The patient was advised to call back or seek an in-person evaluation if the symptoms worsen or if the condition fails to improve as anticipated.  I provided 35 minutes of non-face-to-face time during this encounter.   Stasia Cavalier, MD

## 2022-12-05 ENCOUNTER — Encounter: Payer: Self-pay | Admitting: Orthopedic Surgery

## 2022-12-10 ENCOUNTER — Ambulatory Visit
Admission: RE | Admit: 2022-12-10 | Discharge: 2022-12-10 | Disposition: A | Discharge status: Home / Self Care | Payer: Commercial Managed Care - HMO | Source: Ambulatory Visit | Attending: Orthopedic Surgery | Admitting: Orthopedic Surgery

## 2022-12-10 DIAGNOSIS — M5416 Radiculopathy, lumbar region: Secondary | ICD-10-CM

## 2022-12-26 ENCOUNTER — Encounter (HOSPITAL_COMMUNITY): Payer: Self-pay | Admitting: Psychiatry

## 2022-12-26 ENCOUNTER — Ambulatory Visit: Payer: Commercial Managed Care - HMO | Admitting: Orthopedic Surgery

## 2022-12-26 ENCOUNTER — Telehealth (HOSPITAL_BASED_OUTPATIENT_CLINIC_OR_DEPARTMENT_OTHER): Payer: Commercial Managed Care - HMO | Admitting: Psychiatry

## 2022-12-26 DIAGNOSIS — F411 Generalized anxiety disorder: Secondary | ICD-10-CM

## 2022-12-26 DIAGNOSIS — F325 Major depressive disorder, single episode, in full remission: Secondary | ICD-10-CM

## 2022-12-26 DIAGNOSIS — F3181 Bipolar II disorder: Secondary | ICD-10-CM

## 2022-12-26 NOTE — Progress Notes (Signed)
BH MD/PA/NP OP Progress Note  12/26/2022 11:56 AM Jonathan Strong  MRN:  [id number]  Visit Diagnosis:    ICD-10-CM   1. GAD (generalized anxiety disorder)  F41.1     2. Bipolar 2 disorder (HCC)  F31.81     3. Major depression in remission [facility])  F32.5        Assessment: Jonathan Strong is a 37 y.o. male with a history of MDD, GAD, and chronic low back pain who presented to [facility] at [facility] for initial evaluation on 09/18/2022.  During initial evaluation patient reported a history of anxiety, depression, mood lability, and anger which is currently well-controlled.  He did endorse a past history of potential bipolar 2 disorder with periods of questionable hypomania followed by depression. Initially this dx was in question as he had not had progression to mania since starting Celexa.  However patient later experienced mood swings with a significant period of irritability and depression making suicidal statements.  This resulted in police being called to the house though did not escalate any further.  Per patient and collateral obtained from a family friend, with patient's permission, these episodes have been occurring every few months and he seems to cycle between ups and downs.  The symptoms in conjunction with the patient's father's history of schizophrenia/bipolar disorder make the diagnosis of bipolar 2 disorder more likely.  At this time patient meets criteria for bipolar 2 disorder and generalized anxiety disorder.  Jonathan Strong presents for follow-up evaluation. Today, 12/26/22, patient reports doing well over the last few weeks.  He denies starting Trileptal due to concern of adverse side effects.  Patient attributes the improvement to focusing more on therapy and using the tools that he has developed there.  Patient was noted to have improved mood, increased work production, with a degree of circumstantial thought process and pressured speech.   There was some concern of potential onset of mania/hypomania which was discussed with the patient.  He however did not feel that this was mania/hypomania and denied any decreased sleep, impulsivity, reckless behaviors, or grandiosity.  Warning signs of mania were discussed with patient to monitor for.  Patient would like to remain on Celexa at this time and will follow up in a month.  Plan: - Continue Celexa 30 mg QD - Discontinue Trileptal 300 mg QHS (patient did not start) - Continue Lyrica 75 mg BID managed by pain specialist, patient taking once a day - Continue therapy regularly in addition to couples therapy with the same provider. - CMP, CBC, lipid profile, A1c, and TSH reviewed - Crisis resources reviewed - Follow up in a month   Chief Complaint:  Chief Complaint  Patient presents with   Follow-up   HPI: Patient reports that the past few weeks have gone really well. His kids are doing well. Works is going well, everything seems to be going good. Jonathan Strong reports that he did not try the trileptal this past month. He had anxiety about negative effects and since things were going well he did not feel it was necessary. Instead Jonathan Strong has been using the tools that his therapist has been giving him. Stop rethink, don't respond negatively. Separate himself from the situation. Feels like life has been easier since he has been doing this more. While things can still be tense with his wife at times, he has been working to manage it better. Patient reports that she has been bringing up the past more  lately which has been difficult as Jonathan Strong notes that he does not want to keep fighting with her.  He discussed a couple instances where the 2 of them got into arguments over minor topics.  With the patient's improvement in mood and ability to complete his work better this past month we did a screening for mania/hypomania.  He reports that his sleep is still good and unchanged compared to the last several months.   He denies any decrease these past few weeks.  While he does feel like he is completing more work more quickly this past month he believes that this is back to his baseline and that is not to an excessive degree.  Patient also listed that they planned out this project better than they had past projects which is part of the reason why it went quicker.  Patient denied any grandiosity noting that while he is happy he does not think everything he does perfect.  He also denies any impulsive or reckless behaviors.  We did caution to look out for any signs of mania and encouraged to consider taking the mood stabilizer if manic symptoms were to become more prevalent.  Past Psychiatric History: Patient reports being connected with a therapist in the past and currently sees a therapist for both individual and couples counseling.  Denies connecting with any psychiatrist in the past has been prescribed medications through his PCP.  Patient denies any psychiatric hospitalizations or prior suicide attempts.  Has tried Abilify, Prozac, Depakote (over sedating), Valium, and gabapentin in the past. Currently on a regimen of Celexa and pregablin.   Substance use: Uses Alcohol 3-5 beers a day. Working to cut down.  Counseling provided for alcohol cessation.  Denies marijuana use. Has used psilocybin once or twice in the past. Denies any other substance use  Past Medical History:  Past Medical History:  Diagnosis Date   Depression    History of chicken pox    History of mononucleosis    treated at [facility] 3 weeks ago per patient   Hyperlipidemia     Past Surgical History:  Procedure Laterality Date   NO PAST SURGERIES     Family History:  Family History  Problem Relation Age of Onset   Mental illness Brother    Bipolar disorder Brother    Schizophrenia Father    Rectal cancer Neg Hx    Stomach cancer Neg Hx     Social History:  Social History   Socioeconomic History   Marital status: Married    Spouse  name: Not on file   Number of children: 2   Years of education: Not on file   Highest education level: Not on file  Occupational History   Occupation: Holiday representative  Tobacco Use   Smoking status: Every Day    Packs/day: .1    Types: Cigarettes   Smokeless tobacco: Never  Vaping Use   Vaping Use: Never used  Substance and Sexual Activity   Alcohol use: Yes    Alcohol/week: 35.0 standard drinks of alcohol    Types: 14 Cans of beer, 21 Shots of liquor per week   Drug use: No   Sexual activity: Yes    Partners: Female  Other Topics Concern   Not on file  Social History Narrative   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[employer], Surveyor, mining - cabinets, custom      Home Situation: lives with wife and 2 daughters - 3 yrs and 3 months in 2016  Spiritual Beliefs: none      Lifestyle: no regular exercise; diet is ok      Social Determinants of Health   Financial Resource Strain: Low Risk  (05/24/2020)   Overall Financial Resource Strain (CARDIA)    Difficulty of Paying Living Expenses: Not very hard  Food Insecurity: No Food Insecurity (02/15/2020)   Hunger Vital Sign    Worried About Running Out of Food in the Last Year: Never true    Ran Out of Food in the Last Year: Never true  Transportation Needs: No Transportation Needs (02/15/2020)   PRAPARE - Administrator, Civil Service (Medical): No    Lack of Transportation (Non-Medical): No  Physical Activity: Inactive (02/15/2020)   Exercise Vital Sign    Days of Exercise per Week: 0 days    Minutes of Exercise per Session: 0 min  Stress: Stress Concern Present (05/24/2020)   [employer] of Occupational Health - Occupational Stress Questionnaire    Feeling of Stress : To some extent  Social Connections: Moderately Isolated (05/24/2020)   Social Connection and Isolation Panel [NHANES]    Frequency of Communication with Friends and Family: Twice a week    Frequency of Social Gatherings with Friends and Family:  Twice a week    Attends Religious Services: Never    Database administrator or Organizations: No    Attends Banker Meetings: Never    Marital Status: Married    Allergies:  Allergies  Allergen Reactions   Gabapentin Other (See Comments)    Became suicidal   Hydrocodone    Oxycodone     Current Medications: Current Outpatient Medications  Medication Sig Dispense Refill   citalopram (CELEXA) 20 MG tablet TAKE 1 AND 1/2 TABLETS DAILY BY MOUTH 45 tablet 1   diazepam (VALIUM) 10 MG tablet Take 1 tablet (10 mg total) by mouth every 8 (eight) hours as needed. 30 tablet 3   meloxicam (MOBIC) 15 MG tablet Take 1 tablet (15 mg total) by mouth daily. 30 tablet 0   Oxcarbazepine (TRILEPTAL) 300 MG tablet Take 1 tablet (300 mg total) by mouth every evening. 30 tablet 1   pregabalin (LYRICA) 75 MG capsule Take 1 capsule (75 mg total) by mouth 2 (two) times daily. 60 capsule 0   tiZANidine (ZANAFLEX) 4 MG tablet Take 1 tablet (4 mg total) by mouth every 6 (six) hours as needed for muscle spasms. 30 tablet 0   No current facility-administered medications for this visit.     Psychiatric Specialty Exam: Review of Systems  There were no vitals taken for this visit.There is no height or weight on file to calculate BMI.  General Appearance: Well Groomed  Eye Contact:  Fair  Speech:  Clear and Coherent and Normal Rate  Volume:  Normal  Mood:  Euthymic  Affect:  Congruent  Thought Process:  Coherent and Descriptions of Associations: Circumstantial  Orientation:  Full (Time, Place, and Person)  Thought Content: Logical   Suicidal Thoughts:  No  Homicidal Thoughts:  No  Memory:  NA  Judgement:  Fair  Insight:  Fair  Psychomotor Activity:  Normal  Concentration:  Concentration: Fair  Recall:  Fair  Fund of Knowledge: Fair  Language: Good  Akathisia:  NA    AIMS (if indicated): not done  Assets:  Communication Skills Desire for  Improvement Housing Talents/Skills Transportation Vocational/Educational  ADL's:  Intact  Cognition: WNL  Sleep:  Fair   Metabolic Disorder Labs: Lab Results  Component Value Date   HGBA1C 5.4 09/05/2022   No results found for: "PROLACTIN" Lab Results  Component Value Date   CHOL 266 (H) 09/05/2022   TRIG 348.0 (H) 09/05/2022   HDL 38.60 (L) 09/05/2022   CHOLHDL 7 09/05/2022   VLDL 69.6 (H) 09/05/2022   Lab Results  Component Value Date   TSH 1.10 09/05/2022   TSH 1.00 08/30/2021    Therapeutic Level Labs: No results found for: "LITHIUM" No results found for: "VALPROATE" No results found for: "CBMZ"   Screenings: AUDIT    Flowsheet Row Counselor from 05/24/2020 in [facility] Counselor [facility]  Alcohol Use Disorder Identification Test Final Score (AUDIT) 16 10      GAD-7    Flowsheet Row Office Visit [facility]/18/20[facility] Counselor from 05/24/2020 in [facility] Counselor from 02/15/2020 in [facility]  Total GAD-7 Score 0 2 15      PHQ2-9    Flowsheet Row Office Visit from 11/20/2022 in [facility] Office Visit from 09/18/2022 in [facility] Office Visit from 09/05/2022 in [facility] Office Visit from 02/27/2022 in [facility] Visit from 10/21/2021 in [facility] [place]  PHQ-2 Total Score 4 1 1  0 0  PHQ-9 Total Score 19 4 4  -- 2      Flowsheet Row Office Visit from 09/18/2022 in [facility] ED from 02/08/2021 in [facility])  C-SSRS RISK CATEGORY No Risk No Risk       Collaboration of Care: Collaboration of Care: Medication Management AEB medication  prescription  Patient/Guardian was advised Release of Information must be obtained prior to any record release in order to collaborate their care with an outside provider. Patient/Guardian was advised if they have not already done so to contact the registration department to sign all necessary forms in order for [place] to release information regarding their care.   Consent: Patient/Guardian gives verbal consent for treatment and assignment of benefits for services provided during this visit. Patient/Guardian expressed understanding and agreed to proceed.    Stasia Cavalier, MD 12/26/2022, 11:56 AM   40 minutes were spent in chart review, interview, psycho education, counseling, medical decision making, coordination of care and long-term prognosis.  Patient was given opportunity to ask question and all concerns and questions were addressed and answers. Excluding separately billable services.   Virtual Visit via Video Note  I connected with Jonathan Strong on 12/26/22 at 10:30 AM EDT by a video enabled telemedicine application and verified that I am speaking with the correct person using two identifiers.  Location: Patient: Home Provider: Home Office   I discussed the limitations of evaluation and management by telemedicine and the availability of in person appointments. The patient expressed understanding and agreed to proceed.   I discussed the assessment and treatment plan with the patient. The patient was provided an opportunity to ask questions and all were answered. The patient agreed with the plan and demonstrated an understanding of the instructions.   The patient was advised to call back or seek an in-person evaluation if the symptoms worsen or if the condition fails to improve as anticipated.   Stasia Cavalier, MD

## 2022-12-27 ENCOUNTER — Other Ambulatory Visit (HOSPITAL_COMMUNITY): Payer: Self-pay | Admitting: Psychiatry

## 2022-12-27 DIAGNOSIS — F3181 Bipolar II disorder: Secondary | ICD-10-CM

## 2022-12-31 ENCOUNTER — Ambulatory Visit: Payer: Commercial Managed Care - HMO | Admitting: Orthopedic Surgery

## 2022-12-31 DIAGNOSIS — M5416 Radiculopathy, lumbar region: Secondary | ICD-10-CM

## 2022-12-31 NOTE — Progress Notes (Signed)
Orthopedic Spine Surgery Office Note  Assessment: Patient is a 37 y.o. male with low back pain that radiates into the left lower extremity.  Has stenosis at L3/4 and a large paracentral disc herniation at L4/5 causing radiculopathy.   Plan: -Patient has tried PT, NSAIDs, Tylenol, Valium, oral steroids, steroid injections -Since he has not had any improvement with conservative treatment over the last 4 months, offered operative intervention as a treatment option.  Patient elected to proceed -Patient will next be seen at the date of surgery   The patient has symptoms consistent with lumbar radiculopathy. The patient's symptoms were not getting improvement with conservative treatment so operative management was discussed in the form of L3/4 laminectomy and L4/5 microdiscectomy as a treatment option.  The risks including but not limited to iatrogenic instability, dural tear, nerve root injury, paralysis, persistent pain, infection, recurrent disc herniation, bleeding, heart attack, death, fracture, and need for additional procedures were discussed with the patient. The benefit of the surgery would be improvement in the patient's radiating leg pain. I explained that back pain relief is not the goal of the surgery and it is not reliably alleviated with this surgery. The alternatives to surgical management were covered with the patient and included continued monitoring, physical therapy, over-the-counter pain medications, ambulatory aids, repeat injections, and activity modification. All the patient's questions were answered to his satisfaction. After this discussion, the patient expressed understanding and elected to proceed with surgical intervention.     ___________________________________________________________________________  History: Patient is a 37 y.o. male who has been previously seen in the office for symptoms consistent with lumbar radiculopathy. Since the last visit, his symptoms have remained  the same.  He still having low back pain that radiates into the left buttock, left posterior thigh, left posterior lateral leg, and left lateral foot.  Has some pain that radiates into the right buttock but is not nearly as severe as the left leg.  The pain is felt equally in the back and the left leg.  He feels the pain on a daily basis.  His pain has not improved at all with conservative treatments.  He still notices some weakness in the left leg particularly in the foot and ankle.  Has not noticed any paresthesias or numbness.  No bowel or bladder incontinence.  No saddle anesthesia.  Previous treatments: PT, NSAIDs, Tylenol, Valium, oral steroids, steroid injections  Physical Exam:  General: no acute distress, appears stated age Neurologic: alert, answering questions appropriately, following commands Respiratory: unlabored breathing on room air, symmetric chest rise Psychiatric: appropriate affect, normal cadence to speech   MSK (spine):   -Strength exam                                                   Left                  Right EHL                              4/5                  5/5 TA  5/5                  5/5 GSC                             5/5                  5/5 Knee extension            5/5                  5/5 Hip flexion                    5/5                  5/5   -Sensory exam                           Sensation intact to light touch in L3-S1 nerve distributions of bilateral lower extremities   -Achilles DTR: 2/4 on the left, 2/4 on the right -Patellar tendon DTR: 2/4 on the left, 2/4 on the right   -Straight leg raise: Positive bilaterally -Femoral nerve stretch test: Negative bilaterally -Clonus: no beats bilaterally   -Left hip exam: Positive FADIR, no pain through remainder of range of motion, negative Stinchfield, negative FABER -Right hip exam: No pain through range of motion, negative Stinchfield, negative  FABER  Imaging: XR of the lumbar spine from 11/21/2022 was independently reviewed and interpreted, showing transitional anatomy.  There is disc height loss at the lowest complete disc which I am calling L5/S1.  No fracture or dislocation seen.  No evidence of instability on flexion/extension views.   MRI of the lumbar spine from 12/10/2022 was independently reviewed and interpreted, showing central disc bulge at L1/2 but no significant compression.  DDD at L3/4 and L4/5.  Central stenosis at L3/4.  Lateral recess stenosis with a large, left paracentral disc herniation with caudal migration.   Patient name: Jonathan Strong Patient MRN: [id number] Date of visit: 12/31/22

## 2022-12-31 NOTE — Progress Notes (Signed)
Pre-operative Scores  ODI: 20 VAS back: 7.5 VAS leg: 7.5 SF-36:  -Physical functioning: 45  -Role limitations due to physical health: 0  -Role limitations due to emotional problems: 0  -Energy/fatigue: 45  -Emotional well-being: 15  -Social functioning: 37.5  -Pain: 22.5  -General health: 73   London Sheer, MD Orthopedic Surgeon

## 2023-01-12 ENCOUNTER — Telehealth: Payer: Self-pay | Admitting: *Deleted

## 2023-01-12 NOTE — Telephone Encounter (Signed)
[facility] from faxed to [phone] Form placed to be scan in patient chart

## 2023-01-13 ENCOUNTER — Telehealth: Payer: Self-pay | Admitting: Orthopedic Surgery

## 2023-01-13 NOTE — Telephone Encounter (Signed)
I spoke with patient in reference to scheduling surgery. Patient has decided to hold off on scheduling until mid September. I advised patient I would call him back in July to give him a September surgery date. In the meantime patient would like to know doctor's recommendations on other spine surgeons he could possible get a second opinion with. Please call to advise.

## 2023-01-14 ENCOUNTER — Other Ambulatory Visit: Payer: Self-pay | Admitting: Radiology

## 2023-01-14 DIAGNOSIS — M5416 Radiculopathy, lumbar region: Secondary | ICD-10-CM

## 2023-01-14 DIAGNOSIS — G8929 Other chronic pain: Secondary | ICD-10-CM

## 2023-01-14 NOTE — Telephone Encounter (Signed)
Patient requested referral to Dr. Yevette Edwards @ [name]--Order placed

## 2023-01-23 ENCOUNTER — Telehealth (HOSPITAL_BASED_OUTPATIENT_CLINIC_OR_DEPARTMENT_OTHER): Payer: Commercial Managed Care - HMO | Admitting: Psychiatry

## 2023-01-23 ENCOUNTER — Encounter (HOSPITAL_COMMUNITY): Payer: Self-pay | Admitting: Psychiatry

## 2023-01-23 DIAGNOSIS — F411 Generalized anxiety disorder: Secondary | ICD-10-CM | POA: Diagnosis not present

## 2023-01-23 DIAGNOSIS — G8929 Other chronic pain: Secondary | ICD-10-CM

## 2023-01-23 MED ORDER — DIAZEPAM 10 MG PO TABS: 10.0000 mg | ORAL_TABLET | Freq: Three times a day (TID) | ORAL | 0 refills | Status: DC | PRN

## 2023-01-23 MED ORDER — CITALOPRAM HYDROBROMIDE 20 MG PO TABS
ORAL_TABLET | ORAL | 2 refills | Status: DC
Start: 2023-01-23 — End: 2023-06-09

## 2023-01-23 NOTE — Progress Notes (Signed)
BH MD/PA/NP OP Progress Note  01/23/2023 10:25 AM Jonathan Strong  MRN:  [id number]  Visit Diagnosis:    ICD-10-CM   1. GAD (generalized anxiety disorder)  F41.1 citalopram (CELEXA) 20 MG tablet    2. Chronic bilateral low back pain without sciatica  M54.50 diazepam (VALIUM) 10 MG tablet   G89.29         Assessment: Jonathan Strong is a 37 y.o. male with a history of MDD, GAD, and chronic low back pain who presented to [facility] for initial evaluation on 09/18/2022.  During initial evaluation patient reported a history of anxiety, depression, mood lability, and anger which is currently well-controlled.  He did endorse a past history of potential bipolar 2 disorder with periods of questionable hypomania followed by depression. Initially this dx was in question as he had not had progression to mania since starting Celexa.  However patient later experienced mood swings with a significant period of irritability and depression making suicidal statements.  This resulted in police being called to the house though did not escalate any further.  Per patient and collateral obtained from a family friend, with patient's permission, these episodes have been occurring every few months and he seems to cycle between ups and downs.  The symptoms in conjunction with the patient's father's history of schizophrenia/bipolar disorder make the diagnosis of bipolar 2 disorder more likely.  At this time patient meets criteria for bipolar 2 disorder and generalized anxiety disorder.  Jonathan Strong presents for follow-up evaluation. Today, 01/23/23, patient reports things have continued to go well for the past 2 months now.  He still lists therapy and communication with his partner for the reason of his improvement.  He denies any irritability or changes in sleep during this time.  Patient did not have pressured speech or circumstantial thought process during interview  today.  Patient did endorse some increased anxiety due to financial stressors.  We will continue Celexa and restart Valium today.  Of note patient discontinued pregabalin due to lack of effect.  Patient will plan to follow-up with his primary care provider future for medication management.  He is aware that he can reach out to this provider in the future if needed.  Plan: - Continue Celexa 30 mg every day - Restart Valium 10 mg every day prn for anxiety - Discontinue Trileptal 300 mg QHS (patient did not start) - Discontinued Lyrica 75 mg BID managed by pain specialist, due to lack of effect - Continue therapy regularly in addition to couples therapy with the same provider. - CMP, CBC, lipid profile, A1c, and TSH reviewed - Crisis resources reviewed - Follow up as needed in the future   Chief Complaint:  Chief Complaint  Patient presents with   Follow-up   HPI: Patient reports that the last month has gone pretty well over there past month. Things have stayed consistently good over the past month and he denies getting mad during this past time even when things happened both at work and home that would have triggered him.  Jonathan Strong spoke about events of work at home that are typically triggered his irritability however have not occurred during this time.  He also reported speaking with his wife about not taking out their frustrations at work on a each other.  They seem to have been getting along better recently.  Jonathan Strong notes that he has also started to participate more around the house cleaning and taking  care of things.  Patient notes that his only negative at this time is some increased anxiety about finances.  He notes that they have been going further into debt recently which has him worried.  He denies this is being debilitating or getting overly down.  He continues to take the Celexa regularly and was interested in restarting Valium.  Patient notes that he discontinued the Valium when pregabalin  was started however has now stopped the Valium due to lack of benefit.  He is going to proceed with surgical options later in the year.  We agreed to restart Valium today and PDMP was reviewed.  Patient will also return to his PCP for medication management going forward and reach out in the future if needed.  Past Psychiatric History: Patient reports being connected with a therapist in the past and currently sees a therapist for both individual and couples counseling.  Denies connecting with any psychiatrist in the past has been prescribed medications through his PCP.  Patient denies any psychiatric hospitalizations or prior suicide attempts.  Has tried Abilify, Prozac, Depakote (over sedating), Valium, and gabapentin in the past. Currently on a regimen of Celexa and pregablin.   Substance use: Uses Alcohol 3-5 beers a day. Working to cut down.  Counseling provided for alcohol cessation.  Denies marijuana use. Has used psilocybin once or twice in the past. Denies any other substance use  Past Medical History:  Past Medical History:  Diagnosis Date   Depression    History of chicken pox    History of mononucleosis    treated at [facility] ER 3 weeks ago per patient   Hyperlipidemia     Past Surgical History:  Procedure Laterality Date   NO PAST SURGERIES     Family History:  Family History  Problem Relation Age of Onset   Mental illness Brother    Bipolar disorder Brother    Schizophrenia Father    Rectal cancer Neg Hx    Stomach cancer Neg Hx     Social History:  Social History   Socioeconomic History   Marital status: Married    Spouse name: Not on file   Number of children: 2   Years of education: Not on file   Highest education level: Not on file  Occupational History   Occupation: Holiday representative  Tobacco Use   Smoking status: Every Day    Packs/day: .1    Types: Cigarettes   Smokeless tobacco: Never  Vaping Use   Vaping Use: Never used  Substance and Sexual Activity    Alcohol use: Yes    Alcohol/week: 35.0 standard drinks of alcohol    Types: 14 Cans of beer, 21 Shots of liquor per week   Drug use: No   Sexual activity: Yes    Partners: Female  Other Topics Concern   Not on file  Social History Narrative   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[employer], Surveyor, mining - cabinets, custom      Home Situation: lives with wife and 2 daughters - 3 yrs and 3 months in 2016      Spiritual Beliefs: none      Lifestyle: no regular exercise; diet is ok      Social Determinants of Corporate investment banker Strain: Low Risk  (05/24/2020)   Overall Financial Resource Strain (CARDIA)    Difficulty of Paying Living Expenses: Not very hard  Food Insecurity: No Food Insecurity (02/15/2020)   Hunger Vital Sign    Worried  About Running Out of Food in the Last Year: Never true    Ran Out of Food in the Last Year: Never true  Transportation Needs: No Transportation Needs (02/15/2020)   PRAPARE - Administrator, Civil Service (Medical): No    Lack of Transportation (Non-Medical): No  Physical Activity: Inactive (02/15/2020)   Exercise Vital Sign    Days of Exercise per Week: 0 days    Minutes of Exercise per Session: 0 min  Stress: Stress Concern Present (05/24/2020)   [employer] of Occupational Health - Occupational Stress Questionnaire    Feeling of Stress : To some extent  Social Connections: Moderately Isolated (05/24/2020)   Social Connection and Isolation Panel [NHANES]    Frequency of Communication with Friends and Family: Twice a week    Frequency of Social Gatherings with Friends and Family: Twice a week    Attends Religious Services: Never    Database administrator or Organizations: No    Attends Banker Meetings: Never    Marital Status: Married    Allergies:  Allergies  Allergen Reactions   Gabapentin Other (See Comments)    Became suicidal   Hydrocodone    Oxycodone     Current Medications: Current  Outpatient Medications  Medication Sig Dispense Refill   citalopram (CELEXA) 20 MG tablet TAKE 1 AND 1/2 TABLETS DAILY BY MOUTH 45 tablet 2   diazepam (VALIUM) 10 MG tablet Take 1 tablet (10 mg total) by mouth every 8 (eight) hours as needed. 30 tablet 0   meloxicam (MOBIC) 15 MG tablet Take 1 tablet (15 mg total) by mouth daily. 30 tablet 0   tiZANidine (ZANAFLEX) 4 MG tablet Take 1 tablet (4 mg total) by mouth every 6 (six) hours as needed for muscle spasms. 30 tablet 0   No current facility-administered medications for this visit.     Psychiatric Specialty Exam: Review of Systems  There were no vitals taken for this visit.There is no height or weight on file to calculate BMI.  General Appearance: Well Groomed  Eye Contact:  Fair  Speech:  Clear and Coherent and Normal Rate  Volume:  Normal  Mood:  Euthymic and mild anxiety  Affect:  Congruent  Thought Process:  Coherent and Goal Directed  Orientation:  Full (Time, Place, and Person)  Thought Content: Logical   Suicidal Thoughts:  No  Homicidal Thoughts:  No  Memory:  NA  Judgement:  Fair  Insight:  Fair  Psychomotor Activity:  Normal  Concentration:  Concentration: Fair  Recall:  Fair  Fund of Knowledge: Fair  Language: Good  Akathisia:  NA    AIMS (if indicated): not done  Assets:  Communication Skills Desire for Improvement Housing Talents/Skills Transportation Vocational/Educational  ADL's:  Intact  Cognition: WNL  Sleep:  Good   Metabolic Disorder Labs: Lab Results  Component Value Date   HGBA1C 5.4 09/05/2022   No results found for: "PROLACTIN" Lab Results  Component Value Date   CHOL 266 (H) 09/05/2022   TRIG 348.0 (H) 09/05/2022   HDL 38.60 (L) 09/05/2022   CHOLHDL 7 09/05/2022   VLDL 69.6 (H) 09/05/2022   Lab Results  Component Value Date   TSH 1.10 09/05/2022   TSH 1.00 08/30/2021    Therapeutic Level Labs: No results found for: "LITHIUM" No results found for: "VALPROATE" No results  found for: "CBMZ"   Screenings: AUDIT    Flowsheet Row Counselor from 05/24/2020 in [facility]  Counselor from 02/15/2020 in [facility]  Alcohol Use Disorder Identification Test Final Score (AUDIT) 16 10      GAD-7    Flowsheet Row Office Visit from [facility]/18/2022 in [facility] Counselor from 05/24/2020 in [facility] Counselor from 02/15/2020 in [facility]  Total GAD-7 Score 0 2 15      PHQ2-9    Flowsheet Row Office Visit from 11/20/2022 in [facility] Office Visit from 09/18/2022 in [facility] Office Visit from 09[facility]05/2022 in [facility] Office Visit from 02/27/2022 in [facility] from 10/21/2021 in [facility]  PHQ-2 Total Score 4 1 1  0 0  PHQ-9 Total Score 19 4 4  -- 2      Flowsheet Row [facility] from 02/08/2021 in [facility])  C-SSRS RISK CATEGORY No Risk No Risk       Collaboration of Care: Collaboration of Care: Medication Management AEB medication prescription and Other provider involved in patient's care AEB orthopedics chart review  Patient/Guardian was advised Release of Information must be obtained prior to any record release in order to collaborate their care with an outside provider. Patient/Guardian was advised if they have not already done so to contact the registration department to sign all necessary forms in order for [place] to release information regarding their care.   Consent: Patient/Guardian gives verbal consent for treatment and assignment of benefits for services provided during this visit. Patient/Guardian expressed understanding and agreed to  proceed.    Stasia Cavalier, MD 01/23/2023, 10:25 AM   30 minutes were spent in chart review, interview, psycho education, counseling, medical decision making, coordination of care and long-term prognosis.  Patient was given opportunity to ask question and all concerns and questions were addressed and answers. Excluding separately billable services.   Virtual Visit via Video Note  I connected with Jonathan Strong on 01/23/23 at 10:00 AM EDT by a video enabled telemedicine application and verified that I am speaking with the correct person using two identifiers.  Location: Patient: Home Provider: Home Office   I discussed the limitations of evaluation and management by telemedicine and the availability of in person appointments. The patient expressed understanding and agreed to proceed.   I discussed the assessment and treatment plan with the patient. The patient was provided an opportunity to ask questions and all were answered. The patient agreed with the plan and demonstrated an understanding of the instructions.   The patient was advised to call back or seek an in-person evaluation if the symptoms worsen or if the condition fails to improve as anticipated.   Stasia Cavalier, MD

## 2023-03-17 ENCOUNTER — Telehealth: Payer: Self-pay | Admitting: Orthopedic Surgery

## 2023-03-17 NOTE — Telephone Encounter (Signed)
Orthopedic Note  Previously had seen and discussed patient in the office for his low back pain radiating into his left leg. Discussed L3/4 laminectomy and L4/5 discectomy as a treatment option for him. He wanted to get a second opinion so I had provided him with several names. He thought about surgery further and wants to do the discectomy only. I worry that he will have residual symptoms since he has Grade C Schizas at L3/4. However, he wanted to proceed with microdiscectomy only. If he has residual symptoms then we may have to do the laminectomy at a later date.   London Sheer, MD Orthopedic Surgeon

## 2023-04-10 ENCOUNTER — Telehealth: Payer: Self-pay | Admitting: Family Medicine

## 2023-04-10 NOTE — Telephone Encounter (Signed)
error 

## 2023-04-16 ENCOUNTER — Telehealth (INDEPENDENT_AMBULATORY_CARE_PROVIDER_SITE_OTHER): Payer: Commercial Managed Care - HMO | Admitting: Family Medicine

## 2023-04-16 VITALS — Temp 98.5°F | Ht 72.0 in | Wt 218.0 lb

## 2023-04-16 DIAGNOSIS — M545 Low back pain, unspecified: Secondary | ICD-10-CM

## 2023-04-16 DIAGNOSIS — F411 Generalized anxiety disorder: Secondary | ICD-10-CM | POA: Diagnosis not present

## 2023-04-16 DIAGNOSIS — G8929 Other chronic pain: Secondary | ICD-10-CM

## 2023-04-16 DIAGNOSIS — F325 Major depressive disorder, single episode, in full remission: Secondary | ICD-10-CM | POA: Diagnosis not present

## 2023-04-16 MED ORDER — DIAZEPAM 10 MG PO TABS
10.0000 mg | ORAL_TABLET | Freq: Three times a day (TID) | ORAL | 5 refills | Status: DC | PRN
Start: 2023-04-16 — End: 2023-05-18

## 2023-04-16 NOTE — Progress Notes (Signed)
   Jonathan Strong is a 37 y.o. male who presents today for a virtual office visit.  Assessment/Plan:  Chronic Problems Addressed Today: Chronic back pain, Low Back Pain Overall stable despite recent flare.  He still has ongoing issues secondary to herniated disc and has upcoming discectomy with orthopedics.  We takes Valium as needed as a muscle relaxer muscle relaxer.  Uses sparingly.  Last refill was several months ago.  He tolerates well without any significant side effects.  Will refill today.  Follow-up in 6 to 12 months.  GAD (generalized anxiety disorder) Anxiety much better controlled on current regimen Celexa 30 mg daily.  Following with psychiatry.  Typically does not need to use Valium for his anxiety or stress.  Major depression in remission (HCC) Stable on Celexa 30 mg daily per psychiatry.     Subjective:  HPI:  See Assessment / plan for status of chronic conditions.  Patient here today for follow up. He needs a refill on his valium.  He is currently taking this every 8 hours as needed.  He primarily uses this as a muscle relaxer.  He is following with psychiatry for anxiety and depression but this is well-controlled on Celexa 30 mg daily.  Previously did get a refill on his Valium from psychiatry however they told him he need to get ongoing refills from [place].  Overall back pain is about the same.  He did have recurrence a couple of days ago at home after bending over and awkwardly while brushing his teeth.  Pain was severe but resolved after a couple of days.  He is currently following with orthopedics for herniated disc and has upcoming discectomy in a few weeks.       Objective/Observations  Physical Exam: Gen: NAD, resting comfortably Pulm: Normal work of breathing Neuro: Grossly normal, moves all extremities Psych: Normal affect and thought content  Virtual Visit via Video   I connected with Jonathan Strong on 04/16/23 at  8:40 AM EDT by a video enabled  telemedicine application and verified that I am speaking with the correct person using two identifiers. The limitations of evaluation and management by telemedicine and the availability of in person appointments were discussed. The patient expressed understanding and agreed to proceed.   Patient location: Patient's private vehicle.  Provider location: [place] [employer] Persons participating in the virtual visit: Myself and Patient     [name]. [name], MD 04/16/2023 9:07 AM

## 2023-04-16 NOTE — Assessment & Plan Note (Signed)
Anxiety much better controlled on current regimen Celexa 30 mg daily.  Following with psychiatry.  Typically does not need to use Valium for his anxiety or stress.

## 2023-04-16 NOTE — Assessment & Plan Note (Signed)
Overall stable despite recent flare.  He still has ongoing issues secondary to herniated disc and has upcoming discectomy with orthopedics.  We takes Valium as needed as a muscle relaxer muscle relaxer.  Uses sparingly.  Last refill was several months ago.  He tolerates well without any significant side effects.  Will refill today.  Follow-up in 6 to 12 months.

## 2023-04-16 NOTE — Assessment & Plan Note (Signed)
Stable on Celexa 30 mg daily per psychiatry.

## 2023-04-17 NOTE — Pre-Procedure Instructions (Addendum)
Surgical Instructions    Your procedure is scheduled on Friday, October 4th.  Report to [facility] Main Entrance "A" at 10:30 A.M., then check in with the Admitting office.  Call this number if you have problems the morning of surgery:  [phone]  If you have any questions prior to your surgery date call [phone]: Open Monday-Friday 8am-4pm If you experience any cold or flu symptoms such as cough, fever, chills, shortness of breath, etc. between now and your scheduled surgery, please notify us at the above number.     Remember:  Do not eat after midnight the night before your surgery  You may drink clear liquids until 9:30 a.m. the morning of your surgery.   Clear liquids allowed are: Water, Non-Citrus Juices (without pulp), Carbonated Beverages, Clear Tea, Black Coffee Only (NO MILK, CREAM OR POWDERED CREAMER of any kind), and Gatorade.  Patient Instructions  The night before surgery:  No food after midnight. ONLY clear liquids after midnight   The day of surgery (if you do NOT have diabetes):  Drink ONE (1) Pre-Surgery Clear Ensure by 9:30 a.m. the morning of surgery. Drink in one sitting. Do not sip.  This drink was given to you during your hospital  pre-op appointment visit.  Nothing else to drink after completing the  Pre-Surgery Clear Ensure.         If you have questions, please contact your surgeon's office.     Take these medicines the morning of surgery with A SIP OF WATER  citalopram (CELEXA)  diazepam (VALIUM)-as needed.  As of today, STOP taking any Aspirin (unless otherwise instructed by your surgeon) Aleve, Naproxen, Ibuprofen, Motrin, Advil, Goody's, BC's, all herbal medications, fish oil, and all vitamins.                     Do NOT Smoke (Tobacco/Vaping) for 24 hours prior to your procedure.  If you use a CPAP at night, you may bring your mask/headgear for your overnight stay.   Contacts, glasses, piercing's, hearing aid's, dentures or partials  may not be worn into surgery, please bring cases for these belongings.    For patients admitted to the hospital, discharge time will be determined by your treatment team.   Patients discharged the day of surgery will not be allowed to drive home, and someone needs to stay with them for 24 hours.  SURGICAL WAITING ROOM VISITATION Patients having surgery or a procedure may have no more than 2 support people in the waiting area - these visitors may rotate.   Children under the age of 13 must have an adult with them who is not the patient. If the patient needs to stay at the hospital during part of their recovery, the visitor guidelines for inpatient rooms apply. Pre-op nurse will coordinate an appropriate time for 1 support person to accompany patient in pre-op.  This support person may not rotate.   Please refer to the [facility] website for the visitor guidelines for Inpatients (after your surgery is over and you are in a regular room).    Special instructions:     Pre-operative 5 CHG Bath Instructions   You can play a key role in reducing the risk of infection after surgery. Your skin needs to be as free of germs as possible. You can reduce the number of germs on your skin by washing with CHG (chlorhexidine gluconate) soap before surgery. CHG is an antiseptic soap that kills germs and continues to kill germs  even after washing.   DO NOT use if you have an allergy to chlorhexidine/CHG or antibacterial soaps. If your skin becomes reddened or irritated, stop using the CHG and notify one of our RNs at [phone].   Please shower with the CHG soap starting 4 days before surgery using the following schedule:     Please keep in mind the following:  DO NOT shave, including legs and underarms, starting the day of your first shower.   You may shave your face at any point before/day of surgery.  Place clean sheets on your bed the day you start using CHG soap. Use a clean washcloth (not used  since being washed) for each shower. DO NOT sleep with pets once you start using the CHG.   CHG Shower Instructions:  If you choose to wash your hair and private area, wash first with your normal shampoo/soap.  After you use shampoo/soap, rinse your hair and body thoroughly to remove shampoo/soap residue.  Turn the water OFF and apply about 3 tablespoons (45 ml) of CHG soap to a CLEAN washcloth.  Apply CHG soap ONLY FROM YOUR NECK DOWN TO YOUR TOES (washing for 3-5 minutes)  DO NOT use CHG soap on face, private areas, open wounds, or sores.  Pay special attention to the area where your surgery is being performed.  If you are having back surgery, having someone wash your back for you may be helpful. Wait 2 minutes after CHG soap is applied, then you may rinse off the CHG soap.  Pat dry with a clean towel  Put on clean clothes/pajamas   If you choose to wear lotion, please use ONLY the CHG-compatible lotions on the back of this paper.     Additional instructions for the day of surgery: DO NOT APPLY any lotions, deodorants, cologne, or perfumes.   Put on clean/comfortable clothes.  Brush your teeth.  Ask your nurse before applying any prescription medications to the skin.      CHG Compatible Lotions   Aveeno Moisturizing lotion  Cetaphil Moisturizing Cream  Cetaphil Moisturizing Lotion  Clairol Herbal Essence Moisturizing Lotion, Dry Skin  Clairol Herbal Essence Moisturizing Lotion, Extra Dry Skin  Clairol Herbal Essence Moisturizing Lotion, Normal Skin  Curel Age Defying Therapeutic Moisturizing Lotion with Alpha Hydroxy  Curel Extreme Care Body Lotion  Curel Soothing Hands Moisturizing Hand Lotion  Curel Therapeutic Moisturizing Cream, Fragrance-Free  Curel Therapeutic Moisturizing Lotion, Fragrance-Free  Curel Therapeutic Moisturizing Lotion, Original Formula  Eucerin Daily Replenishing Lotion  Eucerin Dry Skin Therapy Plus Alpha Hydroxy Crme  Eucerin Dry Skin Therapy Plus  Alpha Hydroxy Lotion  Eucerin Original Crme  Eucerin Original Lotion  Eucerin Plus Crme Eucerin Plus Lotion  Eucerin TriLipid Replenishing Lotion  Keri Anti-Bacterial Hand Lotion  Keri Deep Conditioning Original Lotion Dry Skin Formula Softly Scented  Keri Deep Conditioning Original Lotion, Fragrance Free Sensitive Skin Formula  Keri Lotion Fast Absorbing Fragrance Free Sensitive Skin Formula  Keri Lotion Fast Absorbing Softly Scented Dry Skin Formula  Keri Original Lotion  Keri Skin Renewal Lotion Keri Silky Smooth Lotion  Keri Silky Smooth Sensitive Skin Lotion  Nivea Body Creamy Conditioning Oil  Nivea Body Extra Enriched Teacher, adult education Moisturizing Lotion Nivea Crme  Nivea Skin Firming Lotion  NutraDerm 30 Skin Lotion  NutraDerm Skin Lotion  NutraDerm Therapeutic Skin Cream  NutraDerm Therapeutic Skin Lotion  ProShield Protective Hand Cream  Provon moisturizing lotion   Please read over the following  fact sheets that you were given.   If you received a COVID test during your pre-op visit  it is requested that you wear a mask when out in public, stay away from anyone that may not be feeling well and notify your surgeon if you develop symptoms. If you have been in contact with anyone that has tested positive in the last 10 days please notify you surgeon.

## 2023-04-20 ENCOUNTER — Other Ambulatory Visit: Payer: Self-pay

## 2023-04-20 ENCOUNTER — Encounter (HOSPITAL_COMMUNITY)
Admission: RE | Admit: 2023-04-20 | Discharge: 2023-04-20 | Disposition: A | Discharge status: Home / Self Care | Payer: Commercial Managed Care - HMO | Source: Ambulatory Visit | Attending: Behavioral Health | Admitting: Behavioral Health

## 2023-04-20 ENCOUNTER — Encounter (HOSPITAL_COMMUNITY): Payer: Self-pay

## 2023-04-20 VITALS — BP 137/85 | HR 80 | Temp 98.0°F | Resp 18 | Ht 73.0 in | Wt 223.0 lb

## 2023-04-20 DIAGNOSIS — M5126 Other intervertebral disc displacement, lumbar region: Secondary | ICD-10-CM | POA: Insufficient documentation

## 2023-04-20 DIAGNOSIS — Z01812 Encounter for preprocedural laboratory examination: Secondary | ICD-10-CM | POA: Diagnosis not present

## 2023-04-20 DIAGNOSIS — Z01818 Encounter for other preprocedural examination: Secondary | ICD-10-CM | POA: Diagnosis present

## 2023-04-20 LAB — BASIC METABOLIC PANEL
Anion gap: 9 (ref 5–15)
BUN: 12 mg/dL (ref 6–20)
CO2: 23 mmol/L (ref 22–32)
Calcium: 9.1 mg/dL (ref 8.9–10.3)
Chloride: 106 mmol/L (ref 98–111)
Creatinine, Ser: 0.82 mg/dL (ref 0.61–1.24)
GFR, Estimated: >60 mL/min (ref >60)
Glucose, Bld: 128 mg/dL — ABNORMAL HIGH (ref 70–99)
Potassium: 4.4 mmol/L (ref 3.5–5.1)
Sodium: 138 mmol/L (ref 135–145)

## 2023-04-20 LAB — SURGICAL PCR SCREEN
MRSA, PCR: NEGATIVE
Staphylococcus aureus: NEGATIVE

## 2023-04-20 LAB — CBC
HCT: 43.1 % (ref 39.0–52.0)
Hemoglobin: 14.7 g/dL (ref 13.0–17.0)
MCH: 32.7 pg (ref 26.0–34.0)
MCHC: 34.1 g/dL (ref 30.0–36.0)
MCV: 95.8 fL (ref 80.0–100.0)
Platelets: 211 10*3/uL (ref 150–400)
RBC: 4.5 MIL/uL (ref 4.22–5.81)
RDW: 11.9 % (ref 11.5–15.5)
WBC: 4.5 10*3/uL (ref 4.0–10.5)
nRBC: 0 % (ref 0.0–0.2)

## 2023-04-20 NOTE — Progress Notes (Signed)
PCP: Dr. Jacquiline Doe Cardiologist:denies  EKG: n/a CXR: n/a ECHO: denies Stress Test: denies Cardiac Cath: denies  Fasting Blood Sugar- n/a Checks Blood Sugar___ times a day  Patient denies shortness of breath, fever, cough, and chest pain at PAT appointment.  Patient verbalized understanding of instructions provided today at the PAT appointment.  Patient asked to review instructions at home and day of surgery.    ERAS: Ensure provided  Instructions provided on 5 days CHG bathing

## 2023-05-01 ENCOUNTER — Ambulatory Visit (HOSPITAL_COMMUNITY): Payer: Managed Care, Other (non HMO)

## 2023-05-01 ENCOUNTER — Ambulatory Visit (HOSPITAL_COMMUNITY)
Admission: RE | Admit: 2023-05-01 | Discharge: 2023-05-01 | Disposition: A | Discharge status: Home / Self Care | Payer: Managed Care, Other (non HMO) | Attending: Orthopedic Surgery | Admitting: Orthopedic Surgery

## 2023-05-01 ENCOUNTER — Ambulatory Visit (HOSPITAL_COMMUNITY)
Admission: RE | Disposition: A | Discharge status: Home / Self Care | Payer: Self-pay | Source: Home / Self Care | Attending: Orthopedic Surgery

## 2023-05-01 ENCOUNTER — Encounter (HOSPITAL_COMMUNITY): Payer: Self-pay | Admitting: Orthopedic Surgery

## 2023-05-01 ENCOUNTER — Ambulatory Visit (HOSPITAL_COMMUNITY): Payer: Managed Care, Other (non HMO) | Admitting: Anesthesiology

## 2023-05-01 ENCOUNTER — Other Ambulatory Visit: Payer: Self-pay

## 2023-05-01 DIAGNOSIS — M5126 Other intervertebral disc displacement, lumbar region: Secondary | ICD-10-CM | POA: Diagnosis not present

## 2023-05-01 DIAGNOSIS — F17290 Nicotine dependence, other tobacco product, uncomplicated: Secondary | ICD-10-CM | POA: Diagnosis not present

## 2023-05-01 DIAGNOSIS — M5116 Intervertebral disc disorders with radiculopathy, lumbar region: Secondary | ICD-10-CM | POA: Insufficient documentation

## 2023-05-01 DIAGNOSIS — M48061 Spinal stenosis, lumbar region without neurogenic claudication: Secondary | ICD-10-CM | POA: Diagnosis not present

## 2023-05-01 HISTORY — PX: LUMBAR LAMINECTOMY: SHX95

## 2023-05-01 SURGERY — MICRODISCECTOMY LUMBAR LAMINECTOMY
Anesthesia: General | Site: Spine Lumbar

## 2023-05-01 MED ORDER — PROPOFOL 10 MG/ML IV BOLUS
INTRAVENOUS | Status: DC | PRN
Start: 2023-05-01 — End: 2023-05-01
  Administered 2023-05-01: 160 mg via INTRAVENOUS

## 2023-05-01 MED ORDER — ROCURONIUM BROMIDE 10 MG/ML (PF) SYRINGE
PREFILLED_SYRINGE | INTRAVENOUS | Status: AC
  Filled 2023-05-01: qty 10

## 2023-05-01 MED ORDER — DROPERIDOL 2.5 MG/ML IJ SOLN: 0.6250 mg | Freq: Once | INTRAMUSCULAR | Status: DC | PRN

## 2023-05-01 MED ORDER — SUCCINYLCHOLINE CHLORIDE 200 MG/10ML IV SOSY
PREFILLED_SYRINGE | INTRAVENOUS | Status: DC | PRN
Start: 2023-05-01 — End: 2023-05-01
  Administered 2023-05-01: 120 mg via INTRAVENOUS

## 2023-05-01 MED ORDER — DEXAMETHASONE SODIUM PHOSPHATE 10 MG/ML IJ SOLN
INTRAMUSCULAR | Status: AC
  Filled 2023-05-01: qty 1

## 2023-05-01 MED ORDER — POLYETHYLENE GLYCOL 3350 17 GM/SCOOP PO POWD: 17.0000 g | Freq: Every day | ORAL | 0 refills | Status: DC

## 2023-05-01 MED ORDER — ORAL CARE MOUTH RINSE: 15.0000 mL | Freq: Once | OROMUCOSAL | Status: AC

## 2023-05-01 MED ORDER — SENNA 8.6 MG PO TABS: 1.0000 | ORAL_TABLET | Freq: Two times a day (BID) | ORAL | 0 refills | Status: DC

## 2023-05-01 MED ORDER — LIDOCAINE 2% (20 MG/ML) 5 ML SYRINGE
INTRAMUSCULAR | Status: AC
  Filled 2023-05-01: qty 5

## 2023-05-01 MED ORDER — 0.9 % SODIUM CHLORIDE (POUR BTL) OPTIME
TOPICAL | Status: DC | PRN
  Administered 2023-05-01: 1000 mL

## 2023-05-01 MED ORDER — HYDROMORPHONE HCL 1 MG/ML IJ SOLN: 0.2500 mg | INTRAMUSCULAR | Status: DC | PRN

## 2023-05-01 MED ORDER — FENTANYL CITRATE (PF) 250 MCG/5ML IJ SOLN
INTRAMUSCULAR | Status: DC | PRN
Start: 2023-05-01 — End: 2023-05-01
  Administered 2023-05-01 (×3): 25 ug via INTRAVENOUS
  Administered 2023-05-01 (×3): 50 ug via INTRAVENOUS

## 2023-05-01 MED ORDER — OXYCODONE HCL 5 MG PO TABS
5.0000 mg | ORAL_TABLET | ORAL | 0 refills | Status: DC | PRN
Start: 2023-05-01 — End: 2023-05-04

## 2023-05-01 MED ORDER — ONDANSETRON HCL 4 MG PO TABS: 4.0000 mg | ORAL_TABLET | Freq: Three times a day (TID) | ORAL | 0 refills | Status: DC | PRN

## 2023-05-01 MED ORDER — METHYLPREDNISOLONE ACETATE 40 MG/ML IJ SUSP
INTRAMUSCULAR | Status: AC
  Filled 2023-05-01: qty 1

## 2023-05-01 MED ORDER — TRAMADOL HCL 50 MG PO TABS
50.0000 mg | ORAL_TABLET | Freq: Four times a day (QID) | ORAL | 0 refills | Status: DC | PRN
Start: 2023-05-01 — End: 2023-05-01

## 2023-05-01 MED ORDER — HYDROMORPHONE HCL 1 MG/ML IJ SOLN
INTRAMUSCULAR | Status: AC
  Filled 2023-05-01: qty 0.5

## 2023-05-01 MED ORDER — OXYCODONE HCL 5 MG PO TABS
5.0000 mg | ORAL_TABLET | Freq: Once | ORAL | Status: AC
  Administered 2023-05-01: 5 mg via ORAL

## 2023-05-01 MED ORDER — ACETAMINOPHEN 500 MG PO TABS
1000.0000 mg | ORAL_TABLET | Freq: Once | ORAL | Status: AC
  Administered 2023-05-01: 1000 mg via ORAL
  Filled 2023-05-01: qty 2

## 2023-05-01 MED ORDER — CHLORHEXIDINE GLUCONATE 0.12 % MT SOLN: 15.0000 mL | Freq: Once | OROMUCOSAL | Status: AC

## 2023-05-01 MED ORDER — METHOCARBAMOL 500 MG PO TABS: 500.0000 mg | ORAL_TABLET | Freq: Four times a day (QID) | ORAL | 0 refills | Status: DC | PRN

## 2023-05-01 MED ORDER — METHYLPREDNISOLONE ACETATE 40 MG/ML IJ SUSP
INTRAMUSCULAR | Status: DC | PRN
  Administered 2023-05-01: 40 mg via INTRAMUSCULAR

## 2023-05-01 MED ORDER — TRANEXAMIC ACID-NACL 1000-0.7 MG/100ML-% IV SOLN
1000.0000 mg | INTRAVENOUS | Status: AC
  Administered 2023-05-01: 1000 mg via INTRAVENOUS

## 2023-05-01 MED ORDER — CEFAZOLIN SODIUM-DEXTROSE 2-4 GM/100ML-% IV SOLN
INTRAVENOUS | Status: AC
  Filled 2023-05-01: qty 100

## 2023-05-01 MED ORDER — ONDANSETRON HCL 4 MG/2ML IJ SOLN
INTRAMUSCULAR | Status: AC
  Filled 2023-05-01: qty 2

## 2023-05-01 MED ORDER — ACETAMINOPHEN 160 MG/5ML PO SOLN: 325.0000 mg | Freq: Once | ORAL | Status: DC | PRN

## 2023-05-01 MED ORDER — ACETAMINOPHEN 10 MG/ML IV SOLN: 1000.0000 mg | Freq: Once | INTRAVENOUS | Status: DC | PRN

## 2023-05-01 MED ORDER — THROMBIN 20000 UNITS EX KIT
PACK | CUTANEOUS | Status: AC
  Filled 2023-05-01: qty 1

## 2023-05-01 MED ORDER — BUPIVACAINE-EPINEPHRINE (PF) 0.25% -1:200000 IJ SOLN
INTRAMUSCULAR | Status: AC
  Filled 2023-05-01: qty 30

## 2023-05-01 MED ORDER — OXYCODONE HCL 5 MG PO TABS
ORAL_TABLET | ORAL | Status: AC
  Filled 2023-05-01: qty 1

## 2023-05-01 MED ORDER — PROPOFOL 500 MG/50ML IV EMUL
INTRAVENOUS | Status: DC | PRN
Start: 2023-05-01 — End: 2023-05-01
  Administered 2023-05-01: 50 ug/kg/min via INTRAVENOUS

## 2023-05-01 MED ORDER — MIDAZOLAM HCL 2 MG/2ML IJ SOLN
INTRAMUSCULAR | Status: AC
  Filled 2023-05-01: qty 2

## 2023-05-01 MED ORDER — ONDANSETRON HCL 4 MG/2ML IJ SOLN
INTRAMUSCULAR | Status: DC | PRN
Start: 2023-05-01 — End: 2023-05-01
  Administered 2023-05-01: 4 mg via INTRAVENOUS

## 2023-05-01 MED ORDER — CHLORHEXIDINE GLUCONATE 0.12 % MT SOLN
OROMUCOSAL | Status: AC
  Administered 2023-05-01: 15 mL via OROMUCOSAL
  Filled 2023-05-01: qty 15

## 2023-05-01 MED ORDER — BUPIVACAINE-EPINEPHRINE 0.25% -1:200000 IJ SOLN
INTRAMUSCULAR | Status: DC | PRN
  Administered 2023-05-01: 30 mL

## 2023-05-01 MED ORDER — CEFAZOLIN SODIUM-DEXTROSE 2-4 GM/100ML-% IV SOLN
2.0000 g | INTRAVENOUS | Status: AC
  Administered 2023-05-01: 2 g via INTRAVENOUS

## 2023-05-01 MED ORDER — LACTATED RINGERS IV SOLN: INTRAVENOUS | Status: DC

## 2023-05-01 MED ORDER — TRANEXAMIC ACID-NACL 1000-0.7 MG/100ML-% IV SOLN
INTRAVENOUS | Status: AC
  Filled 2023-05-01: qty 100

## 2023-05-01 MED ORDER — ROCURONIUM BROMIDE 100 MG/10ML IV SOLN
INTRAVENOUS | Status: DC | PRN
Start: 2023-05-01 — End: 2023-05-01
  Administered 2023-05-01: 50 mg via INTRAVENOUS
  Administered 2023-05-01: 25 mg via INTRAVENOUS
  Administered 2023-05-01: 10 mg via INTRAVENOUS

## 2023-05-01 MED ORDER — PROPOFOL 10 MG/ML IV BOLUS
INTRAVENOUS | Status: AC
  Filled 2023-05-01: qty 20

## 2023-05-01 MED ORDER — FENTANYL CITRATE (PF) 250 MCG/5ML IJ SOLN
INTRAMUSCULAR | Status: AC
  Filled 2023-05-01: qty 5

## 2023-05-01 MED ORDER — ACETAMINOPHEN 500 MG PO TABS: 1000.0000 mg | ORAL_TABLET | Freq: Three times a day (TID) | ORAL | 0 refills | Status: AC

## 2023-05-01 MED ORDER — EPHEDRINE 5 MG/ML INJ
INTRAVENOUS | Status: AC
  Filled 2023-05-01: qty 5

## 2023-05-01 MED ORDER — DEXAMETHASONE SODIUM PHOSPHATE 10 MG/ML IJ SOLN
10.0000 mg | Freq: Once | INTRAMUSCULAR | Status: AC
  Administered 2023-05-01: 10 mg via INTRAVENOUS

## 2023-05-01 MED ORDER — ACETAMINOPHEN 325 MG PO TABS: 325.0000 mg | ORAL_TABLET | Freq: Once | ORAL | Status: DC | PRN

## 2023-05-01 MED ORDER — POVIDONE-IODINE 10 % EX SWAB
2.0000 | Freq: Once | CUTANEOUS | Status: AC
  Administered 2023-05-01: 2 via TOPICAL

## 2023-05-01 MED ORDER — MIDAZOLAM HCL 2 MG/2ML IJ SOLN
INTRAMUSCULAR | Status: DC | PRN
Start: 2023-05-01 — End: 2023-05-01
  Administered 2023-05-01: 2 mg via INTRAVENOUS

## 2023-05-01 MED ORDER — THROMBIN 20000 UNITS EX SOLR
CUTANEOUS | Status: DC | PRN
  Administered 2023-05-01: 20 mL via TOPICAL

## 2023-05-01 MED ORDER — LIDOCAINE HCL (CARDIAC) PF 100 MG/5ML IV SOSY
PREFILLED_SYRINGE | INTRAVENOUS | Status: DC | PRN
Start: 2023-05-01 — End: 2023-05-01
  Administered 2023-05-01: 50 mg via INTRATRACHEAL

## 2023-05-01 MED ORDER — MEPERIDINE HCL 25 MG/ML IJ SOLN: 6.2500 mg | INTRAMUSCULAR | Status: DC | PRN

## 2023-05-01 SURGICAL SUPPLY — 48 items
BUR NEURO DRILL SOFT 3.0X3.8M (BURR) ×1 IMPLANT
CANISTER SUCT 3000ML PPV (MISCELLANEOUS) ×1 IMPLANT
CORD BIPOLAR FORCEPS 12FT (ELECTRODE) ×1 IMPLANT
COVER MAYO STAND STRL (DRAPES) ×1 IMPLANT
COVER SURGICAL LIGHT HANDLE (MISCELLANEOUS) ×1 IMPLANT
DRAPE C-ARM 42X72 X-RAY (DRAPES) ×1 IMPLANT
DRAPE MICROSCOPE LEICA 54X105 (DRAPES) ×1 IMPLANT
DRAPE UTILITY XL STRL (DRAPES) ×1 IMPLANT
DRESSING MEPILEX FLEX 4X4 (GAUZE/BANDAGES/DRESSINGS) ×1 IMPLANT
DRSG MEPILEX FLEX 4X4 (GAUZE/BANDAGES/DRESSINGS) ×1
DRSG TEGADERM 2-3/8X2-3/4 SM (GAUZE/BANDAGES/DRESSINGS) IMPLANT
DRSG TEGADERM 4X10 (GAUZE/BANDAGES/DRESSINGS) ×1 IMPLANT
DURAPREP 26ML APPLICATOR (WOUND CARE) ×1 IMPLANT
ELECT BLADE 4.0 EZ CLEAN MEGAD (MISCELLANEOUS) ×1
ELECT BLADE INSULATED 4IN (ELECTROSURGICAL) ×1
ELECT PENCIL ROCKER SW 15FT (MISCELLANEOUS) ×1 IMPLANT
ELECT REM PT RETURN 9FT ADLT (ELECTROSURGICAL) ×1
ELECTRODE BLADE INSULATED 4IN (ELECTROSURGICAL) ×1 IMPLANT
ELECTRODE BLDE 4.0 EZ CLN MEGD (MISCELLANEOUS) ×1 IMPLANT
ELECTRODE REM PT RTRN 9FT ADLT (ELECTROSURGICAL) ×1 IMPLANT
GLOVE BIO SURGEON STRL SZ7.5 (GLOVE) ×1 IMPLANT
GLOVE INDICATOR 7.5 STRL GRN (GLOVE) ×1 IMPLANT
GOWN STRL REUS W/ TWL LRG LVL3 (GOWN DISPOSABLE) ×1 IMPLANT
GOWN STRL REUS W/TWL LRG LVL3 (GOWN DISPOSABLE) ×1
GOWN STRL SURGICAL XL XLNG (GOWN DISPOSABLE) ×1 IMPLANT
KIT BASIN OR (CUSTOM PROCEDURE TRAY) ×1 IMPLANT
KIT POSITION SURG JACKSON T1 (MISCELLANEOUS) ×1 IMPLANT
KIT TURNOVER KIT B (KITS) ×1 IMPLANT
MANIFOLD NEPTUNE II (INSTRUMENTS) IMPLANT
NDL 22X1.5 STRL (OR ONLY) (MISCELLANEOUS) ×1 IMPLANT
NEEDLE 22X1.5 STRL (OR ONLY) (MISCELLANEOUS) ×1
NS IRRIG 1000ML POUR BTL (IV SOLUTION) ×1 IMPLANT
PACK LAMINECTOMY ORTHO (CUSTOM PROCEDURE TRAY) ×1 IMPLANT
PATTIES SURGICAL .5 X.5 (GAUZE/BANDAGES/DRESSINGS) ×1 IMPLANT
SPONGE SURGIFOAM ABS GEL 100 (HEMOSTASIS) ×1 IMPLANT
SPONGE T-LAP 4X18 ~~LOC~~+RFID (SPONGE) ×1 IMPLANT
SUCTION TUBE FRAZIER 10FR DISP (SUCTIONS) ×1 IMPLANT
SUT BONE WAX W31G (SUTURE) ×1 IMPLANT
SUT MNCRL AB 3-0 PS2 18 (SUTURE) ×1 IMPLANT
SUT VIC AB 0 CT1 18XCR BRD8 (SUTURE) ×1 IMPLANT
SUT VIC AB 0 CT1 8-18 (SUTURE) ×1
SUT VIC AB 2-0 CT1 18 (SUTURE) ×1 IMPLANT
SYR BULB IRRIG 60ML STRL (SYRINGE) ×1 IMPLANT
SYR CONTROL 10ML LL (SYRINGE) ×1 IMPLANT
TOWEL GREEN STERILE (TOWEL DISPOSABLE) ×1 IMPLANT
TOWEL GREEN STERILE FF (TOWEL DISPOSABLE) ×1 IMPLANT
TUBING FEATHERFLOW (TUBING) ×1 IMPLANT
WATER STERILE IRR 1000ML POUR (IV SOLUTION) ×1 IMPLANT

## 2023-05-01 NOTE — Transfer of Care (Signed)
Immediate Anesthesia Transfer of Care Note  Patient: Jonathan Strong  Procedure(s) Performed: Lumbar four-five MICRODISCECTOMY LUMBAR (Spine Lumbar)  Patient Location: PACU  Anesthesia Type:General  Level of Consciousness: awake, alert , and oriented  Airway & Oxygen Therapy: Patient Spontanous Breathing  Post-op Assessment: Report given to RN and Post -op Vital signs reviewed and stable  Post vital signs: Reviewed and stable  Last Vitals:  Vitals Value Taken Time  BP 116/74 05/01/23 1805  Temp 37.8 C 05/01/23 1805  Pulse 97 05/01/23 1812  Resp 0 05/01/23 1812  SpO2 93 % 05/01/23 1812  Vitals shown include unfiled device data.  Last Pain:  Vitals:   05/01/23 1805  TempSrc:   PainSc: 0-No pain         Complications: No notable events documented.

## 2023-05-01 NOTE — Discharge Summary (Addendum)
Orthopedic Surgery Discharge Summary  Patient name: Jonathan Strong Patient MRN: [id number] Surgery date: 05/01/23 Discharge date: 05/01/23  Attending physician: Willia Craze, MD Final diagnosis: L4/5 disc herniation, lumbar radiculopathy Findings: L4/5 disc herniation, L4/5 calcified ligamentum flavum  Hospital course: Patient is a 37 y.o. male who came to the hospital and underwent planned L4/5 microdiscectomy. The pre-operative plan was to do this as an outpatient. There were no complications intra-operatively. His blood loss was 30cc. He was stable in PACU and his pain was well controlled. He voided spontaneously and was able to ambulate the halls. The patient was tolerating PO. The patient was medically ready for discharge and was discharge to home on post-operative day 0.  Instructions:   Orthopedic Surgery Discharge Instructions  Patient name: Jonathan Strong Procedure Performed: L4/5 microdiscectomy Date of Surgery: 05/01/2023 Surgeon: Willia Craze, MD  Pre-operative Diagnosis: L4/5 disc herniation, lumbar radiculopathy Post-operative Diagnosis: same as above  Discharge Date: 05/01/2023 Discharged to: home Discharge Condition: stable  Activity: You should refrain from bending, lifting, or twisting with objects greater than ten pounds until six weeks after surgery. You are encouraged to walk as much as desired. You can perform household activities such as cleaning dishes, doing laundry, vacuuming, etc. as long as the ten-pound restriction is followed. You do not need to wear a brace during the post-operative period.   Incision Care: Your incision site has a dressing over it. That dressing should remain in place and dry at all times for a total of one week after surgery. You can shower as long as the dressing remains dry at all times. If it gets wet, you should replace the dressing. After one week, it is okay to remove the dressing. You do not need to put any dressing  back on after a week. Underneath the dressing, you will find skin glue. You should leave the skin glue in place. This will fall off with time. Do not pick, rub, or scrub at the glue. Do not put cream or lotion over the surgical area until six weeks after surgery. Once the dressing is off, it is okay to let soap and water run over your incision. Again, do not pick, scrub, or rub at the skin glue when bathing. Do not submerge (e.g., take a bath, swim, go in a hot tub, etc.) until six weeks after surgery. There may be some bloody drainage from the incision into the dressing after surgery. This is normal. You do not need to replace the dressing. Continue to leave it in place for the one week as instructed above. Should the dressing become saturated with blood or drainage, please call the office for further instructions.   Medications: You have been prescribed oxycodone. This is a narcotic pain medication and should only be taken as prescribed. You should not drink alcohol or operate heavy machinery (including driving) while taking this medication. The oxycodone can cause constipation as a side effect. For that reason, you have been prescribed senna and miralax. These are both laxatives. You do not need to take this medication if you develop diarrhea. Should you remain constipated even while taking these medications, please increase the dose of miralax to twice daily. Tylenol has been prescribed to be taken every 8 hours, which will give you additional pain relief. Robaxin is a muscle relaxer that has been prescribed to you for muscle spasm type pain. Take this medication as needed. Zofran has been prescribed to you as well to help with any  nausea that you have in the post-operative period. You can also cut the oxycodone in half if you get nausea to decrease the amount of narcotic that you are getting at one time.   You can use over-the-counter NSAIDs (ibuprofen, Aleve, Celebrex, naproxen, meloxicam, etc.) for  additional pain relief after this surgery. Do not take these medications before 72 hours after surgery as they increase the risk of bleeding. These medications are safe to take with the Tylenol you have been prescribed. You should not take these medications if you have or have had kidney problems or gastrointestinal ulcers. Take these medications as instructed on the packaging.   In order to set expectations for opioid prescriptions, you will only be prescribed opioids for a total of six weeks after surgery and, at two-weeks after surgery, your opioid prescription will start to tapered (decreased dosage and number of pills). If you have ongoing need for opioid medication six weeks after surgery, you will be referred to pain management. If you are already established with a provider that is giving you opioid medications, you should schedule an appointment with them for six weeks after surgery if you feel you are going to need another prescription. State law only allows for opioid prescriptions one week at a time. If you are running out of opioid medication near the end of the week, please call the office during business hours before running out so I can send you another prescription.   Driving: You should not drive while taking narcotic pain medications. You should start getting back to driving slowly and you may want to try driving in a parking lot before doing anything more.   Diet: You are safe to resume your regular diet after surgery.   Reasons to Call the Office After Surgery: You should feel free to call the office with any concerns or questions you have in the post-operative period, but you should definitely notify the office if you develop: -shortness of breath, chest pain, or trouble breathing -excessive bleeding, drainage, redness, or swelling around the surgical site -fevers, chills, or pain that is getting worse with each passing day -persistent nausea or vomiting -new weakness in either  leg -new or worsening numbness or tingling in either leg -numbness in the groin, bowel or bladder incontinence -other concerns about your surgery  Follow Up Appointments: You should have an office appointment scheduled for approximately two weeks after surgery. If you do not remember when this appointment is or do not already have it scheduled, please call the office to schedule.   Office Information:  -Willia Craze, MD -Phone number: [phone] -Address: [address]       [place], [place] [address]

## 2023-05-01 NOTE — Op Note (Addendum)
Orthopedic Spine Surgery Operative Report  Procedure: L4/5 microdiscectomy Intraoperative microscopy use  Modifier: none  Date of procedure: 05/01/2023  Patient name: Jonathan Strong MRN: [id number] DOB: 1985/08/10  Surgeon: Willia Craze, MD Assistant: none Pre-operative diagnosis: L4/5 disc herniation Post-operative diagnosis: same as above Findings: calcified ligamentum flavum at L4/5, L4/5 herniated disc  Specimens: none Anesthesia: general EBL: 30cc Complications: none Pre-incision antibiotic: ancef TXA was given prior to incision as well  Implants: none   Indication for procedure: Patient is a 37 y.o. male who presented to the office with signs and symptoms consistent with lumbar radiculopathy. MRI showed stenosis at L3/4 and a left-sided disc herniation at L4/5. The patient had tried conservative treatments that did not provide any lasting relief. As result, operative management was discussed. After discussing the surgical options, patient wanted to proceed with microdiscectomy at L4/5. The risks of the surgery including but not limited to recurrent disc herniation, persistent pain, dural tear, nerve root injury, spinal cord injury, infection, bleeding, fracture, instability, need for additional procedures, and risk of anesthesia were discussed with the patient. I explained his risks, particularly infection and wound healing, would be increased due to his active nicotine use. The benefits of the surgery would be faster relief of the patients symptoms radiating leg pain. Explained that this symptomatic relief is for leg pain and the surgery will not necessarily help any back pain. The alternatives to surgical management were covered with the patient and included activity modification, physical therapy, over-the-counter pain medications, and injections.  All the patient's questions were answered to his satisfaction. After this discussion, the patient expressed understanding and  elected to proceed with surgical intervention.  Procedure Description: The patient was met in the pre-operative holding area. The patient's identity and consent were verified. The operative site was marked. The patient's remaining questions about the surgery were answered. The patient was brought back to the operating room. General anesthesia was induced and an endotracheal tube was placed by the anesthesia staff. The patient was transferred to the prone [place] table in the prone position. All bony prominences were well padded. The head of the bed was slightly elevated and the eyes were free from compression by the face pillow. An electric razor was used to remove his hair over the lumbar region. The surgical area was cleansed with alcohol. Fluoroscopy was then brought in to check rotation on the AP image and to mark the levels on the lateral image. The patient's skin was then prepped and draped in a standard, sterile fashion. A time out was performed that identified the patient, the procedure, and the operative level. All team members agreed with what was stated in the time out.   A midline incision over the spinous processes of the previously marked levels was made and sharp dissection was continued down through the skin and dermis. Electrocautery was then used to continue the midline dissection down to the level of the spinous process. Subperiosteal dissection was performed using electrocautery to expose the lamina out lateral to the facet joint capsule on the left side of the L4 lamina. Care was taken to not violate the facet joint capsule. A lateral fluoroscopic image was taken to confirm the level. Subperiosteal dissection with electrocautery was then done to expose all the remainder of the lamina and pars of L4. The cranial aspect of the L5 lamina was also exposed. The operative microscope was brought in at this time.   A high-speed matchstick burr was used to thin the  left hemilamina and the medial  aspect of the facet to the level of the ligamentum flavum. The lamina was thinned with the burr to the edge of the ligamentum flavum insertion. Care was taken to leave at least 8mm of pars interarticularis. A curved curette was used to develop a plane between the ligamentum and the lamina. A series of Kerrison rongeurs were used to remove the thinned lamina to complete the hemilaminotomy. A curved curette was used to elevate the ligamentum flavum off of the thecal sac. A combination of Kerrison rongeurs and a pituitary were used to remove the ligamentum flavum overlying the thecal sac and nerve root in the area of the laminotomy.   A penfield was used to mobilize the nerve root. A nerve root retractor was placed into the laminotomy site and around the traversing nerve root to mobilize it medially. The disc herniation was visualized. A long-handle knife was used to create an annulotomy. A pituitary was used to remove the herniated disc fragment. A pituitary was used to remove the loose fragments. Then, an upbiting pituitary was used to grab further fragments more medial. A nerve hook was placed into the annulotomy to attempt to free any other loose fragments. There were no other loose fragments. A woodsen was used to palpate the disc space and there was no remaining herniation. The disc was in line with the posterior vertebral bodies. A lateral fluoroscopic image was taken with the pituitary in to demonstrate the disc level operated on (L4/5) and that the discectomy was taken to the level of the posterior vertebral bodies. 30cc of 0.25% bupivacaine with epinephrine was injected into the paraspinal muscles and the deep dermal tissue.   The wound was copiously irrigated with sterile saline. The fascia was reapproximated with 0 vicryl suture. The deep dermal layer was reapproximated with 2-0 vicryl. The skin as closed with a 3-0 running moncryl. All counts were correct at the end of the case. The incision was  dressed with dermabond. An island dressing was placed over the wound. The patient was transferred back to a bed and brought to the post-anesthesia care unit by anesthesia staff in stable condition.   Post-operative plan: The patient will recover in the post-anesthesia care unit with a plan to go home after recovering from anesthesia. The patient will be out of bed as tolerated with no brace. The patient will be seen in the office approximately 2 weeks from the date of surgery.   Willia Craze, MD Orthopedic Surgeon

## 2023-05-01 NOTE — H&P (Signed)
Orthopedic Spine Surgery H&P Note  Assessment: Patient is a 37 y.o. male with L4/5 HNP causing radiculopathy   Plan: -Out of bed as tolerated, activity as tolerated, no brace -Covered the risks of surgery one more time with the patient and patient elected to proceed with planned surgery -Written consent verified -Hold anticoagulation in anticipation of surgery -Ancef and TXA on all to OR -NPO for procedure -Site marked -To OR when ready  The risks of the surgery including but not limited to recurrent disc herniation, persistent pain, dural tear, nerve root injury, infection, bleeding, fracture, instability, need for additional procedures, and risk of anesthesia including death were discussed with the patient. Explained that his risks would be higher with active nicotine use, particularly infection.The benefits of the surgery would be faster relief of the patient's radiating leg pain. I explained that back pain relief is not the goal of the surgery and it is not reliably alleviated with this surgery. All of the patient and the patient's wife's questions were answered to their satisfaction. After this discussion, the patient expressed understanding and elected to proceed with surgical intervention.    The patient has developed radiculopathy related to his herniated disc at L4/5. I had told him that he has central stenosis at L3/4 as well. I had planned to decompress that area as well but after discussing, patient only consented and wanted to proceed with the L4/5 discectomy.   ___________________________________________________________________________  Chief Complaint: low back and left leg pain  History: Patient is 37 y.o. male who has been previously seen in the office for lumbar radiculopathy. His radiating leg pain failed to improve with conservative treatment so operative management was discussed at the last office visit. The patient presents today with no changes in his symptoms since the  last office visit. See previous office note for further details.    Review of systems: General: denies fevers and chills, myalgias Neurologic: denies recent changes in vision, slurred speech Abdomen: denies nausea, vomiting, hematemesis Respiratory: denies cough, shortness of breath  Past medical history:  Hyperlipidemia Depression Chronic pain   Allergies: Narcotics, gabapentin   Past surgical history:  None   Social history: Reports use of nicotine product (smoking, vaping, patches, smokeless) Alcohol use: Yes, about 7 drinks per week Denies recreational drug use  Family history: -reviewed and not pertinent to herniated disc   Physical Exam:  General: no acute distress, appears stated age Neurologic: alert, answering questions appropriately, following commands Cardiovascular: regular rate, no cyanosis Respiratory: unlabored breathing on room air, symmetric chest rise Psychiatric: appropriate affect, normal cadence to speech   MSK (spine):  -Strength exam      Left  Right  EHL    4/5  5/5 TA    5/5  5/5 GSC    5/5  5/5 Knee extension  5/5  5/5 Knee flexion   5/5  5/5 Hip flexion   5/5  5/5  -Sensory exam    Sensation intact to light touch in L3-S1 nerve distributions of bilateral lower extremities   Patient name: Jonathan Strong Patient MRN: [id number] Date: 05/01/23

## 2023-05-01 NOTE — Anesthesia Preprocedure Evaluation (Addendum)
Anesthesia Evaluation  Patient identified by MRN, date of birth, ID band Patient awake    Reviewed: Allergy & Precautions, NPO status , Patient's Chart, lab work & pertinent test results  Airway Mallampati: II  TM Distance: >3 FB Neck ROM: Full    Dental  (+) Teeth Intact, Dental Advisory Given   Pulmonary former smoker   breath sounds clear to auscultation       Cardiovascular negative cardio ROS  Rhythm:Regular Rate:Normal     Neuro/Psych  PSYCHIATRIC DISORDERS Anxiety Depression     Neuromuscular disease    GI/Hepatic negative GI ROS, Neg liver ROS,,,  Endo/Other  negative endocrine ROS    Renal/GU negative Renal ROS     Musculoskeletal  (+) Arthritis ,    Abdominal   Peds  Hematology negative hematology ROS (+)   Anesthesia Other Findings   Reproductive/Obstetrics                             Anesthesia Physical Anesthesia Plan  ASA: 2  Anesthesia Plan: General   Post-op Pain Management: Tylenol PO (pre-op)*   Induction: Intravenous  PONV Risk Score and Plan: 3 and Ondansetron, Dexamethasone and Midazolam  Airway Management Planned: Oral ETT  Additional Equipment: None  Intra-op Plan:   Post-operative Plan: Extubation in OR  Informed Consent: I have reviewed the patients History and Physical, chart, labs and discussed the procedure including the risks, benefits and alternatives for the proposed anesthesia with the patient or authorized representative who has indicated his/her understanding and acceptance.     Dental advisory given  Plan Discussed with: CRNA  Anesthesia Plan Comments:        Anesthesia Quick Evaluation

## 2023-05-01 NOTE — Discharge Instructions (Addendum)
Orthopedic Surgery Discharge Instructions  Patient name: Nameer Summer Procedure Performed: L4/5 microdiscectomy Date of Surgery: 05/01/2023 Surgeon: Willia Craze, MD  Pre-operative Diagnosis: L4/5 disc herniation, lumbar radiculopathy Post-operative Diagnosis: same as above  Discharge Date: 05/01/2023 Discharged to: home Discharge Condition: stable  Activity: You should refrain from bending, lifting, or twisting with objects greater than ten pounds until six weeks after surgery. You are encouraged to walk as much as desired. You can perform household activities such as cleaning dishes, doing laundry, vacuuming, etc. as long as the ten-pound restriction is followed. You do not need to wear a brace during the post-operative period.   Incision Care: Your incision site has a dressing over it. That dressing should remain in place and dry at all times for a total of one week after surgery. You can shower as long as the dressing remains dry at all times. If it gets wet, you should replace the dressing. After one week, it is okay to remove the dressing. You do not need to put any dressing back on after a week. Underneath the dressing, you will find skin glue. You should leave the skin glue in place. This will fall off with time. Do not pick, rub, or scrub at the glue. Do not put cream or lotion over the surgical area until six weeks after surgery. Once the dressing is off, it is okay to let soap and water run over your incision. Again, do not pick, scrub, or rub at the skin glue when bathing. Do not submerge (e.g., take a bath, swim, go in a hot tub, etc.) until six weeks after surgery. There may be some bloody drainage from the incision into the dressing after surgery. This is normal. You do not need to replace the dressing. Continue to leave it in place for the one week as instructed above. Should the dressing become saturated with blood or drainage, please call the office for further instructions.    Medications: You have been prescribed oxycodone. This is a narcotic pain medication and should only be taken as prescribed. You should not drink alcohol or operate heavy machinery (including driving) while taking this medication. The oxycodone can cause constipation as a side effect. For that reason, you have been prescribed senna and miralax. These are both laxatives. You do not need to take this medication if you develop diarrhea. Should you remain constipated even while taking these medications, please increase the dose of miralax to twice daily. Tylenol has been prescribed to be taken every 8 hours, which will give you additional pain relief. Robaxin is a muscle relaxer that has been prescribed to you for muscle spasm type pain. Take this medication as needed. Zofran has been prescribed to you as well to help with any nausea that you have in the post-operative period. You can also cut the oxycodone in half if you get nausea to decrease the amount of narcotic that you are getting at one time.   You can use over-the-counter NSAIDs (ibuprofen, Aleve, Celebrex, naproxen, meloxicam, etc.) for additional pain relief after this surgery. Do not take these medications before 72 hours after surgery as they increase the risk of bleeding. These medications are safe to take with the Tylenol you have been prescribed. You should not take these medications if you have or have had kidney problems or gastrointestinal ulcers. Take these medications as instructed on the packaging.   In order to set expectations for opioid prescriptions, you will only be prescribed opioids for a  total of six weeks after surgery and, at two-weeks after surgery, your opioid prescription will start to tapered (decreased dosage and number of pills). If you have ongoing need for opioid medication six weeks after surgery, you will be referred to pain management. If you are already established with a provider that is giving you opioid medications,  you should schedule an appointment with them for six weeks after surgery if you feel you are going to need another prescription. State law only allows for opioid prescriptions one week at a time. If you are running out of opioid medication near the end of the week, please call the office during business hours before running out so I can send you another prescription.   Driving: You should not drive while taking narcotic pain medications. You should start getting back to driving slowly and you may want to try driving in a parking lot before doing anything more.   Diet: You are safe to resume your regular diet after surgery.   Reasons to Call the Office After Surgery: You should feel free to call the office with any concerns or questions you have in the post-operative period, but you should definitely notify the office if you develop: -shortness of breath, chest pain, or trouble breathing -excessive bleeding, drainage, redness, or swelling around the surgical site -fevers, chills, or pain that is getting worse with each passing day -persistent nausea or vomiting -new weakness in either leg -new or worsening numbness or tingling in either leg -numbness in the groin, bowel or bladder incontinence -other concerns about your surgery  Follow Up Appointments: You should have an office appointment scheduled for approximately two weeks after surgery. If you do not remember when this appointment is or do not already have it scheduled, please call the office to schedule.   Office Information:  -Willia Craze, MD -Phone number: [phone] -Address: [address]       [place], [place] [address]

## 2023-05-01 NOTE — Progress Notes (Signed)
Orthopedic Surgery Post-operative Progress Note  Assessment: Patient is a 37 y.o. male who is s/p L4/5 microdiscectomy   Plan: -Operative plans complete -Drain: none -Out of bed as tolerated, no brace -No bending/lifting/twisting greater than 10 pounds -Pain control -Regular diet -No chemoprophylaxis for dvt or antiplatelets for 72 hours after surgery -Anticipate discharge to home from PACU  ___________________________________________________________________________   Subjective: No acute events since surgery. Recovering in the PACU. Pain is under control. Leg pain better but still present. Having back pain as well. Has walked the halls and voided spontaneously.    Objective:  General: no acute distress, appropriate affect, in wheelchair in street clothes Neurologic: alert, answering questions appropriately, following commands Respiratory: unlabored breathing on room air Skin: dressing clear/dry/intact  MSK (spine):  -Strength exam      Right  Left  EHL    5/5  4/5 TA    5/5  5/5 GSC    5/5  5/5 Knee extension  5/5  5/5 Hip flexion   5/5  5/5  -Sensory exam    Sensation intact to light touch in L3-S1 nerve distributions of bilateral lower extremities   Patient name: Jonathan Strong Patient MRN: [id number] Date: 05/01/23

## 2023-05-01 NOTE — Anesthesia Procedure Notes (Addendum)
Procedure Name: Intubation Date/Time: 05/01/2023 1:11 PM  Performed by: Shelton Silvas, MDPre-anesthesia Checklist: Patient identified, Emergency Drugs available, Suction available, Patient being monitored and Timeout performed Patient Re-evaluated:Patient Re-evaluated prior to induction Oxygen Delivery Method: Circle system utilized Preoxygenation: Pre-oxygenation with 100% oxygen Induction Type: IV induction Ventilation: Mask ventilation without difficulty Laryngoscope Size: Mac and 4 Grade View: Grade I Tube type: Oral Tube size: 7.5 mm Number of attempts: 1 Placement Confirmation: ETT inserted through vocal cords under direct vision Secured at: 21 cm Tube secured with: Tape Dental Injury: Teeth and Oropharynx as per pre-operative assessment

## 2023-05-02 ENCOUNTER — Other Ambulatory Visit: Payer: Self-pay

## 2023-05-04 ENCOUNTER — Encounter: Payer: Self-pay | Admitting: Orthopedic Surgery

## 2023-05-04 ENCOUNTER — Encounter: Payer: Self-pay | Admitting: Family Medicine

## 2023-05-04 ENCOUNTER — Ambulatory Visit (INDEPENDENT_AMBULATORY_CARE_PROVIDER_SITE_OTHER): Payer: Managed Care, Other (non HMO) | Admitting: Family Medicine

## 2023-05-04 VITALS — BP 120/82 | HR 79 | Temp 98.4°F | Ht 73.0 in | Wt 225.8 lb

## 2023-05-04 DIAGNOSIS — G8929 Other chronic pain: Secondary | ICD-10-CM | POA: Diagnosis not present

## 2023-05-04 DIAGNOSIS — M545 Low back pain, unspecified: Secondary | ICD-10-CM

## 2023-05-04 DIAGNOSIS — F325 Major depressive disorder, single episode, in full remission: Secondary | ICD-10-CM | POA: Diagnosis not present

## 2023-05-04 DIAGNOSIS — J029 Acute pharyngitis, unspecified: Secondary | ICD-10-CM

## 2023-05-04 LAB — POC COVID19 BINAXNOW: SARS Coronavirus 2 Ag: NEGATIVE

## 2023-05-04 LAB — POCT RAPID STREP A (OFFICE): Rapid Strep A Screen: POSITIVE — AB

## 2023-05-04 MED ORDER — AZELASTINE HCL 0.1 % NA SOLN: 2.0000 | Freq: Two times a day (BID) | NASAL | 12 refills | Status: DC

## 2023-05-04 MED ORDER — AZITHROMYCIN 250 MG PO TABS: ORAL_TABLET | ORAL | 0 refills | Status: DC

## 2023-05-04 MED FILL — Thrombin For Soln Kit 20000 Unit: CUTANEOUS | Qty: 1 | Status: AC

## 2023-05-04 NOTE — Assessment & Plan Note (Signed)
Stable on Celexa 30 mg daily.

## 2023-05-04 NOTE — Patient Instructions (Signed)
It was very nice to see you today!  Your strep test is positive.  Please start the azithromycin.  Let us know if not improving.  Return if symptoms worsen or fail to improve.   Take care, Dr [name]  PLEASE NOTE:  If you had any lab tests, please let us know if you have not heard back within a few days. You may see your results on mychart before we have a chance to review them but we will give you a call once they are reviewed by [place].   If we ordered any referrals today, please let us know if you have not heard from their office within the next week.   If you had any urgent prescriptions sent in today, please check with the pharmacy within an hour of our visit to make sure the prescription was transmitted appropriately.   Please try these tips to maintain a healthy lifestyle:  Eat at least 3 REAL meals and 1-2 snacks per day.  Aim for no more than 5 hours between eating.  If you eat breakfast, please do so within one hour of getting up.   Each meal should contain half fruits/vegetables, one quarter protein, and one quarter carbs (no bigger than a computer mouse)  Cut down on sweet beverages. This includes juice, soda, and sweet tea.   Drink at least 1 glass of water with each meal and aim for at least 8 glasses per day  Exercise at least 150 minutes every week.  Your

## 2023-05-04 NOTE — Assessment & Plan Note (Signed)
Doing well postoperatively.  Recently had microdiscectomy.  Uses Valium as needed.  Does not need refill today.

## 2023-05-04 NOTE — Anesthesia Postprocedure Evaluation (Signed)
Anesthesia Post Note  Patient: Tajee Savant  Procedure(s) Performed: Lumbar four-five MICRODISCECTOMY LUMBAR (Spine Lumbar)     Patient location during evaluation: PACU Anesthesia Type: General Level of consciousness: awake and alert Pain management: pain level controlled Vital Signs Assessment: post-procedure vital signs reviewed and stable Respiratory status: spontaneous breathing, nonlabored ventilation, respiratory function stable and patient connected to nasal cannula oxygen Cardiovascular status: blood pressure returned to baseline and stable Postop Assessment: no apparent nausea or vomiting Anesthetic complications: no   No notable events documented.  Last Vitals:  Vitals:   05/01/23 1830 05/01/23 1839  BP: (!) 152/96 (!) 156/93  Pulse: 99 97  Resp: 16 13  Temp:  37.2 C  SpO2: 95% 95%    Last Pain:  Vitals:   05/01/23 1830  TempSrc:   PainSc: 0-No pain                 Shelton Silvas

## 2023-05-04 NOTE — Progress Notes (Signed)
   Jonathan Strong is a 37 y.o. male who presents today for an office visit.  Assessment/Plan:  New/Acute Problems: Sore throat Likely related to recent intubation due to his surgery.  He does have some middle ear effusion on exam today and may be developing a viral URI however his rapid strep was positive.  Will start Z-Pak.  Encouraged hydration.  He can use over-the-counter meds as needed.  We discussed reasons return to care and seek emergent care.  Chronic Problems Addressed Today: Chronic back pain, Low Back Pain Doing well postoperatively.  Recently had microdiscectomy.  Uses Valium as needed.  Does not need refill today.  Major depression in remission (HCC) Stable on Celexa 30 mg daily.     Subjective:  HPI:  See assessment / plan for status of chronic conditions.  Patient here today for strep throat.  He underwent lumbar 4 5 microdiscectomy 3 days ago with orthopedics.  He was doing well with this post op but he is started noticing more sore throat and ear pain. This seems to be getting worse the last day or two. He initially though it was due to the ET tube from his surgery but now that his symptoms are worsening he would like to make sure that he is not getting any infection. No fevers or chills. No rhinorrhea. No cough or sneeze.        Objective:  Physical Exam: BP 120/82   Pulse 79   Temp 98.4 F (36.9 C)   Ht 6\' 1"  (1.854 m)   Wt 225 lb 12.8 oz (102.4 kg)   SpO2 96%   BMI 29.79 kg/m   Gen: No acute distress, resting comfortably HEENT: TMs with clear effusion.  OP erythematous.  No exudate. CV: Regular rate and rhythm with no murmurs appreciated Pulm: Normal work of breathing, clear to auscultation bilaterally with no crackles, wheezes, or rhonchi Neuro: Grossly normal, moves all extremities Psych: Normal affect and thought content      Avari Gelles M. [name], MD 05/04/2023 10:18 AM

## 2023-05-15 ENCOUNTER — Other Ambulatory Visit: Payer: Self-pay | Admitting: Family Medicine

## 2023-05-15 ENCOUNTER — Ambulatory Visit: Payer: Commercial Managed Care - HMO | Admitting: Orthopedic Surgery

## 2023-05-15 DIAGNOSIS — G8929 Other chronic pain: Secondary | ICD-10-CM

## 2023-05-15 DIAGNOSIS — M5416 Radiculopathy, lumbar region: Secondary | ICD-10-CM

## 2023-05-15 NOTE — Telephone Encounter (Signed)
Prescription Request  05/15/2023  LOV: 05/04/2023  What is the name of the medication or equipment?  diazepam (VALIUM) 10 MG tablet   Have you contacted your pharmacy to request a refill? No   Which pharmacy would you like this sent to?  [id number] - [place], [place] - [address] AT [address] OF [facility] [address] [place] [address] Phone: [phone] Fax: [phone]    Patient notified that their request is being sent to the clinical staff for review and that they should receive a response within 2 business days.   Please advise at Mobile [phone] (mobile)

## 2023-05-16 NOTE — Progress Notes (Signed)
Orthopedic Surgery Post-operative Office Visit  Procedure: L4/5 microdiscectomy Date of Surgery: 05/01/2023  Assessment: Patient is a 37 y.o. who was doing well for 7 days after surgery but then pain returned in the left leg   Plan: -Operative plans complete -Out of bed as tolerated, no brace -No bending/lifting/twisting greater than 10 pounds -He did well after surgery for about 1 week. We talked about possible etiologies of his symptoms. I told him that he still has stenosis at L3/4 which was shown on his pre-operative MRI and was why I had talked about doing a laminectomy at that level. He also could have a recurrent disc herniation. I told him we should do a diagnostic/therapeutic injection at L3/4 to see how much relief he gets with that. If he gets good relief with that, that points towards that level as contributing/causing his pain -Referral provided to Dr. Alvester Morin for a left L3/4 transforaminal injection -Will call him after the injection to see how he is doing  ___________________________________________________________________________   Subjective: Patient was doing well for 1 week after surgery. His leg pain had resolved. Then, 1 week after surgery, his pain returned. There was no trauma or injury that preceded the recurrence of pain. Pain is felt in his bilateral anterior thighs, left buttock, and left lateral thigh. His back pain from the incision improved quickly and he said that is not bad at all. Has not noticed any redness or drainage around his incision.   Rates the leg pain as a 7/10  Objective:  General: no acute distress, appropriate affect Neurologic: alert, answering questions appropriately, following commands Respiratory: unlabored breathing on room air Skin: incision is well approximated with no erythema, induration, active/expressible drainage   MSK (spine):  -Strength exam      Left  Right  EHL    4/5  5/5 TA    5/5  5/5 GSC    5/5  5/5 Knee  extension  5/5  5/5 Hip flexion   5/5  5/5  -Sensory exam    Sensation intact to light touch in L3-S1 nerve distributions of bilateral lower extremities  Imaging: None obtained today   Patient name: Jonathan Strong Patient MRN: [id number] Date of visit: 05/16/23

## 2023-05-19 MED ORDER — DIAZEPAM 10 MG PO TABS
10.0000 mg | ORAL_TABLET | Freq: Three times a day (TID) | ORAL | 5 refills | Status: DC | PRN
Start: 2023-05-19 — End: 2023-07-15

## 2023-05-22 ENCOUNTER — Telehealth: Payer: Self-pay | Admitting: Orthopedic Surgery

## 2023-05-22 MED ORDER — OXYCODONE HCL 5 MG PO TABS
5.0000 mg | ORAL_TABLET | ORAL | 0 refills | Status: AC | PRN
Start: 2023-05-22 — End: 2023-05-27

## 2023-05-22 NOTE — Telephone Encounter (Signed)
Patient called needing Rx refilled Oxycodone. The number to contact patient is [phone]

## 2023-05-25 ENCOUNTER — Ambulatory Visit: Payer: Managed Care, Other (non HMO) | Admitting: Physical Medicine and Rehabilitation

## 2023-05-25 ENCOUNTER — Other Ambulatory Visit: Payer: Self-pay

## 2023-05-25 VITALS — BP 135/81 | HR 77

## 2023-05-25 DIAGNOSIS — M5416 Radiculopathy, lumbar region: Secondary | ICD-10-CM

## 2023-05-25 DIAGNOSIS — M961 Postlaminectomy syndrome, not elsewhere classified: Secondary | ICD-10-CM

## 2023-05-25 MED ORDER — METHYLPREDNISOLONE ACETATE 40 MG/ML IJ SUSP
40.0000 mg | Freq: Once | INTRAMUSCULAR | Status: AC
Start: 2023-05-25 — End: 2023-05-25
  Administered 2023-05-25: 40 mg

## 2023-05-25 NOTE — Patient Instructions (Signed)

## 2023-05-25 NOTE — Procedures (Signed)
Lumbosacral Transforaminal Epidural Steroid Injection - Sub-Pedicular Approach with Fluoroscopic Guidance  Patient: Jonathan Strong      Date of Birth: 1985/11/25 MRN: [id number] PCP: [name], MD      Visit Date: 05/25/2023   Universal Protocol:    Date/Time: 05/25/2023  Consent Given By: the patient  Position: PRONE  Additional Comments: Vital signs were monitored before and after the procedure. Patient was prepped and draped in the usual sterile fashion. The correct patient, procedure, and site was verified.   Injection Procedure Details:   Procedure diagnoses: Lumbar radiculopathy [M54.16]    Meds Administered:  Meds ordered this encounter  Medications   methylPREDNISolone acetate (DEPO-MEDROL) injection 40 mg    Laterality: Left  Location/Site: L3  Needle:5.0 in., 22 ga.  Short bevel or Quincke spinal needle  Needle Placement: Transforaminal  Findings:    -Comments: Excellent flow of contrast along the nerve, nerve root and into the epidural space.  Procedure Details: After squaring off the end-plates to get a true AP view, the C-arm was positioned so that an oblique view of the foramen as noted above was visualized. The target area is just inferior to the "nose of the scotty dog" or sub pedicular. The soft tissues overlying this structure were infiltrated with 2-3 ml. of 1% Lidocaine without Epinephrine.  The spinal needle was inserted toward the target using a "trajectory" view along the fluoroscope beam.  Under AP and lateral visualization, the needle was advanced so it did not puncture dura and was located close the 6 O'Clock position of the pedical in AP tracterory. Biplanar projections were used to confirm position. Aspiration was confirmed to be negative for CSF and/or blood. A 1-2 ml. volume of Isovue-250 was injected and flow of contrast was noted at each level. Radiographs were obtained for documentation purposes.   After attaining the desired  flow of contrast documented above, a 0.5 to 1.0 ml test dose of 0.25% Marcaine was injected into each respective transforaminal space.  The patient was observed for 90 seconds post injection.  After no sensory deficits were reported, and normal lower extremity motor function was noted,   the above injectate was administered so that equal amounts of the injectate were placed at each foramen (level) into the transforaminal epidural space.   Additional Comments:  No complications occurred Dressing: 2 x 2 sterile gauze and Band-Aid    Post-procedure details: Patient was observed during the procedure. Post-procedure instructions were reviewed.  Patient left the clinic in stable condition.

## 2023-05-25 NOTE — Progress Notes (Signed)
Functional Pain Scale - descriptive words and definitions  Distracting (5)    Aware of pain/able to complete some ADL's but limited by pain/sleep is affected and active distractions are only slightly useful. Moderate range order  Average Pain 5   +Driver, -BT, -Dye Allergies.  Lower back pain on left side that radiates into the left leg

## 2023-05-25 NOTE — Progress Notes (Signed)
B[place] - 37 y.o. male MRN [id number]  Date of birth: 05-Aug-1985  Office Visit Note: Visit Date: 05/25/2023 PCP: [name], MD Referred by: London Sheer, MD  Subjective: Chief Complaint  Patient presents with   Lower Back - Pain   HPI:  Jonathan Strong is a 37 y.o. male who comes in today at the request of Dr. Willia Craze for planned Left L3-4 Lumbar Transforaminal epidural steroid injection with fluoroscopic guidance.  The patient has failed conservative care including home exercise, medications, time and activity modification.  This injection will be diagnostic and hopefully therapeutic.  Please see requesting physician notes for further details and justification.   ROS Otherwise per HPI.  Assessment & Plan: Visit Diagnoses:    ICD-10-CM   1. Lumbar radiculopathy  M54.16 methylPREDNISolone acetate (DEPO-MEDROL) injection 40 mg    XR C-ARM NO REPORT    Epidural Steroid injection    2. Post laminectomy syndrome  M96.1 methylPREDNISolone acetate (DEPO-MEDROL) injection 40 mg    XR C-ARM NO REPORT    Epidural Steroid injection      Plan: No additional findings.   Meds & Orders:  Meds ordered this encounter  Medications   methylPREDNISolone acetate (DEPO-MEDROL) injection 40 mg    Orders Placed This Encounter  Procedures   XR C-ARM NO REPORT   Epidural Steroid injection    Follow-up: Return for visit to requesting provider as needed.   Procedures: No procedures performed  Lumbosacral Transforaminal Epidural Steroid Injection - Sub-Pedicular Approach with Fluoroscopic Guidance  Patient: Jonathan Strong      Date of Birth: Dec 26, 1985 MRN: [id number] PCP: [name], MD      Visit Date: 05/25/2023   Universal Protocol:    Date/Time: 05/25/2023  Consent Given By: the patient  Position: PRONE  Additional Comments: Vital signs were monitored before and after the procedure. Patient was prepped and draped in the usual sterile  fashion. The correct patient, procedure, and site was verified.   Injection Procedure Details:   Procedure diagnoses: Lumbar radiculopathy [M54.16]    Meds Administered:  Meds ordered this encounter  Medications   methylPREDNISolone acetate (DEPO-MEDROL) injection 40 mg    Laterality: Left  Location/Site: L3  Needle:5.0 in., 22 ga.  Short bevel or Quincke spinal needle  Needle Placement: Transforaminal  Findings:    -Comments: Excellent flow of contrast along the nerve, nerve root and into the epidural space.  Procedure Details: After squaring off the end-plates to get a true AP view, the C-arm was positioned so that an oblique view of the foramen as noted above was visualized. The target area is just inferior to the "nose of the scotty dog" or sub pedicular. The soft tissues overlying this structure were infiltrated with 2-3 ml. of 1% Lidocaine without Epinephrine.  The spinal needle was inserted toward the target using a "trajectory" view along the fluoroscope beam.  Under AP and lateral visualization, the needle was advanced so it did not puncture dura and was located close the 6 O'Clock position of the pedical in AP tracterory. Biplanar projections were used to confirm position. Aspiration was confirmed to be negative for CSF and/or blood. A 1-2 ml. volume of Isovue-250 was injected and flow of contrast was noted at each level. Radiographs were obtained for documentation purposes.   After attaining the desired flow of contrast documented above, a 0.5 to 1.0 ml test dose of 0.25% Marcaine was injected into each respective transforaminal  space.  The patient was observed for 90 seconds post injection.  After no sensory deficits were reported, and normal lower extremity motor function was noted,   the above injectate was administered so that equal amounts of the injectate were placed at each foramen (level) into the transforaminal epidural space.   Additional Comments:  No  complications occurred Dressing: 2 x 2 sterile gauze and Band-Aid    Post-procedure details: Patient was observed during the procedure. Post-procedure instructions were reviewed.  Patient left the clinic in stable condition.    Clinical History: CLINICAL DATA:  Initial evaluation for lumbar radiculopathy, lower back pain radiating into the left lower extremity.   EXAM: MRI LUMBAR SPINE WITHOUT CONTRAST   TECHNIQUE: Multiplanar, multisequence MR imaging of the lumbar spine was performed. No intravenous contrast was administered.   COMPARISON:  Radiograph from 11/21/2022.   FINDINGS: Segmentation: Standard. Lowest well-formed disc space labeled the L5-S1 level.   Alignment: Mild straightening of the normal lumbar lordosis. Trace degenerative retrolisthesis of L1 on L2.   Vertebrae: Vertebral body height maintained without acute or chronic fracture. Bone marrow signal intensity within normal limits. No worrisome osseous lesions. Mild reactive endplate change present about the L3-4 and L4-5 interspaces. No other abnormal marrow edema.   Conus medullaris and cauda equina: Conus extends to the T12-L1 level. Conus and cauda equina appear normal.   Paraspinal and other soft tissues: Unremarkable.   Disc levels:   A degree of underlying congenital spinal stenosis noted.   L1-2: Diffuse disc bulge with disc desiccation. Superimposed small central disc protrusion with slight inferior migration. Mild facet hypertrophy. Resultant mild canal with left greater than right lateral recess stenosis. Foramina remain patent.   L2-3: Negative interspace. Mild facet hypertrophy. No spinal stenosis. Foramina remain patent.   L3-4: Degenerative disc space narrowing with diffuse disc bulge and disc desiccation. Superimposed central disc protrusion with inferior migration (series 13, image 27). Mild facet hypertrophy. Prominence of the dorsal epidural fat with underlying short pedicles.  Resultant moderate canal with left greater than right lateral recess stenosis. Mild right L3 foraminal narrowing. Left neural foramina remains patent.   L4-5: Degenerative intervertebral disc space narrowing with disc desiccation and diffuse disc bulge. Associated reactive endplate spurring. Superimposed left subarticular disc extrusion with inferior migration, impinging upon the descending left L5 nerve root (series 13, image 33). Superimposed mild facet hypertrophy. Resultant moderate canal with severe left lateral recess stenosis. Moderate bilateral L4 foraminal narrowing.   L5-S1: Normal interspace. Mild facet hypertrophy. Epidural lipomatosis. No canal or foraminal stenosis.   IMPRESSION: 1. Left subarticular disc extrusion with inferior migration at L4-5, impinging upon the descending left L5 nerve root in the left lateral recess. 2. Central disc protrusion with inferior migration at L3-4 with resultant moderate canal and left greater than right lateral recess stenosis. 3. Disc bulge with small central disc protrusion at L1-2 with resultant mild canal and left greater than right lateral recess stenosis. 4. Moderate bilateral L4 foraminal stenosis related to disc bulge, reactive endplate change, and facet hypertrophy.     Electronically Signed   By: Rise Mu M.D.   On: 12/14/2022 18:17     Objective:  VS:  HT:    WT:   BMI:     BP:135/81  HR:77bpm  TEMP: ( )  RESP:  Physical Exam Vitals and nursing note reviewed.  Constitutional:      General: He is not in acute distress.    Appearance: Normal appearance. He is not  ill-appearing.  HENT:     Head: Normocephalic and atraumatic.     Right Ear: External ear normal.     Left Ear: External ear normal.     Nose: No congestion.  Eyes:     Extraocular Movements: Extraocular movements intact.  Cardiovascular:     Rate and Rhythm: Normal rate.     Pulses: Normal pulses.  Pulmonary:     Effort:  Pulmonary effort is normal. No respiratory distress.  Abdominal:     General: There is no distension.     Palpations: Abdomen is soft.  Musculoskeletal:        General: No tenderness or signs of injury.     Cervical back: Neck supple.     Right lower leg: No edema.     Left lower leg: No edema.     Comments: Patient has good distal strength without clonus.  Skin:    Findings: No erythema or rash.  Neurological:     General: No focal deficit present.     Mental Status: He is alert and oriented to person, place, and time.     Sensory: No sensory deficit.     Motor: No weakness or abnormal muscle tone.     Coordination: Coordination normal.  Psychiatric:        Mood and Affect: Mood normal.        Behavior: Behavior normal.      Imaging: No results found.

## 2023-06-01 ENCOUNTER — Telehealth: Payer: Self-pay | Admitting: Orthopedic Surgery

## 2023-06-01 DIAGNOSIS — M5416 Radiculopathy, lumbar region: Secondary | ICD-10-CM

## 2023-06-01 NOTE — Telephone Encounter (Signed)
Orthopedic Telephone Call  Patient underwent L4/5 microdiscectomy.  He initially did well after surgery for about a week and then developed recurrent symptoms.  He also developed new symptoms of bilateral lateral hip and bilateral buttock pain.  After last visit, he got an injection at L3/4 where there is central stenosis.  He got significant relief for about 5 to 6 days and then pain returned.  I told him based on the fact that he got relief with that injection that that points towards the central stenosis at L3/4 as contributing to his pain.  I also told him that on his preoperative MRI, his disc herniation is more on the left side which would explain more left-sided symptoms.  Since he has had recurrent symptoms and some new symptoms, ordered an MRI to evaluate for possible recurrent disc herniation and to look at the central stenosis again at L3/4.  I told him my plan would be to do a L3/4 laminectomy to address his central stenosis and if there is a recurrent disc herniation, to do a revision discectomy at L4/5. I answered his and his wife's questions to their satisfaction.  London Sheer, MD Orthopedic Surgeon

## 2023-06-06 ENCOUNTER — Other Ambulatory Visit (HOSPITAL_COMMUNITY): Payer: Self-pay | Admitting: Psychiatry

## 2023-06-06 DIAGNOSIS — F411 Generalized anxiety disorder: Secondary | ICD-10-CM

## 2023-06-09 ENCOUNTER — Telehealth: Payer: Self-pay | Admitting: Family Medicine

## 2023-06-09 ENCOUNTER — Other Ambulatory Visit: Payer: Self-pay | Admitting: *Deleted

## 2023-06-09 DIAGNOSIS — F411 Generalized anxiety disorder: Secondary | ICD-10-CM

## 2023-06-09 MED ORDER — CITALOPRAM HYDROBROMIDE 20 MG PO TABS: ORAL_TABLET | ORAL | 2 refills | Status: DC

## 2023-06-09 NOTE — Telephone Encounter (Signed)
Prescription Request  06/09/2023  LOV: 05/04/2023  What is the name of the medication or equipment?  citalopram (CELEXA) 20 MG tablet   Have you contacted your pharmacy to request a refill? Yes   Which pharmacy would you like this sent to?  [id number] - [place], [place] - [address] AT CORNER OF [facility] [address] [place] [address] Phone: [phone] Fax: [phone]    Patient notified that their request is being sent to the clinical staff for review and that they should receive a response within 2 business days.   Please advise at Mobile [phone] (mobile)

## 2023-06-09 NOTE — Telephone Encounter (Signed)
Rx [facility] to [facility]  

## 2023-06-13 ENCOUNTER — Other Ambulatory Visit: Payer: Managed Care, Other (non HMO)

## 2023-06-14 ENCOUNTER — Encounter: Payer: Self-pay | Admitting: Orthopedic Surgery

## 2023-06-17 NOTE — Telephone Encounter (Signed)
Orthopedic Telephone Call  Patient still with significant leg pain. He feels it going into bilateral lower extremities. He tried an injection at L3/4 that gave him good relief but then it wore off. I told him to take tylenol and ibuprofen for now. He is far enough out from his surgery to take ibuprofen. He continues to have symptoms even with these medications and pain has not gotten better. I talked about further conservative treatments we could try, but he said they have not been working. For that reason, I talked about surgey as an option.  I specifically discussed a L3/4 laminectomy where he has central stenosis that could explain his bilateral leg symptoms. During the surgery, will plan to explore the L4/5 space as well to evaluate for recurrent disc herniation since his insurance company denied a lumbar MRI to evaluate for that. After telling him this plan, he wanted to proceed with this treatment.   Jonathan Sheer, MD Orthopedic Surgeon

## 2023-07-02 NOTE — Progress Notes (Signed)
Surgical Instructions   Your procedure is scheduled on July 07, 2023. Report to [facility] Main Entrance "A" at 5:30 A.M., then check in with the Admitting office. Any questions or running late day of surgery: call [phone]  Questions prior to your surgery date: call [phone], Monday-Friday, 8am-4pm. If you experience any cold or flu symptoms such as cough, fever, chills, shortness of breath, etc. between now and your scheduled surgery, please notify us at the above number.     Remember:  Do not eat after midnight the night before your surgery  You may drink clear liquids until 4:30 am the morning of your surgery.   Clear liquids allowed are: Water, Non-Citrus Juices (without pulp), Carbonated Beverages, Clear Tea (no milk, honey, etc.), Black Coffee Only (NO MILK, CREAM OR POWDERED CREAMER of any kind), and Gatorade.    Take these medicines the morning of surgery with A SIP OF WATER:  NONE   May take these medicines IF NEEDED:  diazepam (VALIUM)  methocarbamol (ROBAXIN)  oxyCODONE (OXY IR/ROXICODONE)     One week prior to surgery, STOP taking any Aspirin (unless otherwise instructed by your surgeon) Aleve, Naproxen, Ibuprofen, Motrin, Advil, Goody's, BC's, all herbal medications, fish oil, and non-prescription vitamins.                     Do NOT Smoke (Tobacco/Vaping) for 24 hours prior to your procedure.  If you use a CPAP at night, you may bring your mask/headgear for your overnight stay.   You will be asked to remove any contacts, glasses, piercing's, hearing aid's, dentures/partials prior to surgery. Please bring cases for these items if needed.    Patients discharged the day of surgery will not be allowed to drive home, and someone needs to stay with them for 24 hours.  SURGICAL WAITING ROOM VISITATION Patients may have no more than 2 support people in the waiting area - these visitors may rotate.   Pre-op nurse will coordinate an appropriate time for 1  ADULT support person, who may not rotate, to accompany patient in pre-op.  Children under the age of 57 must have an adult with them who is not the patient and must remain in the main waiting area with an adult.  If the patient needs to stay at the hospital during part of their recovery, the visitor guidelines for inpatient rooms apply.  Please refer to the [facility] website for the visitor guidelines for any additional information.   If you received a COVID test during your pre-op visit  it is requested that you wear a mask when out in public, stay away from anyone that may not be feeling well and notify your surgeon if you develop symptoms. If you have been in contact with anyone that has tested positive in the last 10 days please notify you surgeon.      Pre-operative 5 CHG Bathing Instructions   You can play a key role in reducing the risk of infection after surgery. Your skin needs to be as free of germs as possible. You can reduce the number of germs on your skin by washing with CHG (chlorhexidine gluconate) soap before surgery. CHG is an antiseptic soap that kills germs and continues to kill germs even after washing.   DO NOT use if you have an allergy to chlorhexidine/CHG or antibacterial soaps. If your skin becomes reddened or irritated, stop using the CHG and notify one of our RNs at [phone].   Please shower  with the CHG soap starting 4 days before surgery using the following schedule:     Please keep in mind the following:  DO NOT shave, including legs and underarms, starting the day of your first shower.   You may shave your face at any point before/day of surgery.  Place clean sheets on your bed the day you start using CHG soap. Use a clean washcloth (not used since being washed) for each shower. DO NOT sleep with pets once you start using the CHG.   CHG Shower Instructions:  Wash your face and private area with normal soap. If you choose to wash your hair, wash  first with your normal shampoo.  After you use shampoo/soap, rinse your hair and body thoroughly to remove shampoo/soap residue.  Turn the water OFF and apply about 3 tablespoons (45 ml) of CHG soap to a CLEAN washcloth.  Apply CHG soap ONLY FROM YOUR NECK DOWN TO YOUR TOES (washing for 3-5 minutes)  DO NOT use CHG soap on face, private areas, open wounds, or sores.  Pay special attention to the area where your surgery is being performed.  If you are having back surgery, having someone wash your back for you may be helpful. Wait 2 minutes after CHG soap is applied, then you may rinse off the CHG soap.  Pat dry with a clean towel  Put on clean clothes/pajamas   If you choose to wear lotion, please use ONLY the CHG-compatible lotions on the back of this paper.   Additional instructions for the day of surgery: DO NOT APPLY any lotions, deodorants, cologne, or perfumes.   Do not bring valuables to the hospital. [facility] is not responsible for any belongings/valuables. Do not wear nail polish, gel polish, artificial nails, or any other type of covering on natural nails (fingers and toes) Do not wear jewelry or makeup Put on clean/comfortable clothes.  Please brush your teeth.  Ask your nurse before applying any prescription medications to the skin.     CHG Compatible Lotions   Aveeno Moisturizing lotion  Cetaphil Moisturizing Cream  Cetaphil Moisturizing Lotion  Clairol Herbal Essence Moisturizing Lotion, Dry Skin  Clairol Herbal Essence Moisturizing Lotion, Extra Dry Skin  Clairol Herbal Essence Moisturizing Lotion, Normal Skin  Curel Age Defying Therapeutic Moisturizing Lotion with Alpha Hydroxy  Curel Extreme Care Body Lotion  Curel Soothing Hands Moisturizing Hand Lotion  Curel Therapeutic Moisturizing Cream, Fragrance-Free  Curel Therapeutic Moisturizing Lotion, Fragrance-Free  Curel Therapeutic Moisturizing Lotion, Original Formula  Eucerin Daily Replenishing Lotion   Eucerin Dry Skin Therapy Plus Alpha Hydroxy Crme  Eucerin Dry Skin Therapy Plus Alpha Hydroxy Lotion  Eucerin Original Crme  Eucerin Original Lotion  Eucerin Plus Crme Eucerin Plus Lotion  Eucerin TriLipid Replenishing Lotion  Keri Anti-Bacterial Hand Lotion  Keri Deep Conditioning Original Lotion Dry Skin Formula Softly Scented  Keri Deep Conditioning Original Lotion, Fragrance Free Sensitive Skin Formula  Keri Lotion Fast Absorbing Fragrance Free Sensitive Skin Formula  Keri Lotion Fast Absorbing Softly Scented Dry Skin Formula  Keri Original Lotion  Keri Skin Renewal Lotion Keri Silky Smooth Lotion  Keri Silky Smooth Sensitive Skin Lotion  Nivea Body Creamy Conditioning Oil  Nivea Body Extra Enriched Teacher, adult education Moisturizing Lotion Nivea Crme  Nivea Skin Firming Lotion  NutraDerm 30 Skin Lotion  NutraDerm Skin Lotion  NutraDerm Therapeutic Skin Cream  NutraDerm Therapeutic Skin Lotion  ProShield Protective Hand Cream  Provon moisturizing lotion  Please  read over the following fact sheets that you were given.

## 2023-07-03 ENCOUNTER — Encounter (HOSPITAL_COMMUNITY)
Admission: RE | Admit: 2023-07-03 | Discharge: 2023-07-03 | Disposition: A | Discharge status: Home / Self Care | Payer: Commercial Managed Care - HMO | Source: Ambulatory Visit | Attending: Orthopedic Surgery | Admitting: Orthopedic Surgery

## 2023-07-03 ENCOUNTER — Other Ambulatory Visit: Payer: Self-pay

## 2023-07-03 VITALS — BP 129/85 | HR 73 | Temp 98.3°F | Resp 17 | Ht 73.0 in | Wt 229.3 lb

## 2023-07-03 DIAGNOSIS — Z01812 Encounter for preprocedural laboratory examination: Secondary | ICD-10-CM | POA: Diagnosis not present

## 2023-07-03 DIAGNOSIS — Z01818 Encounter for other preprocedural examination: Secondary | ICD-10-CM

## 2023-07-03 LAB — BASIC METABOLIC PANEL
Anion gap: 12 (ref 5–15)
BUN: 14 mg/dL (ref 6–20)
CO2: 21 mmol/L — ABNORMAL LOW (ref 22–32)
Calcium: 9.2 mg/dL (ref 8.9–10.3)
Chloride: 104 mmol/L (ref 98–111)
Creatinine, Ser: 0.81 mg/dL (ref 0.61–1.24)
GFR, Estimated: >60 mL/min (ref >60)
Glucose, Bld: 108 mg/dL — ABNORMAL HIGH (ref 70–99)
Potassium: 4 mmol/L (ref 3.5–5.1)
Sodium: 137 mmol/L (ref 135–145)

## 2023-07-03 LAB — CBC
HCT: 44.9 % (ref 39.0–52.0)
Hemoglobin: 15 g/dL (ref 13.0–17.0)
MCH: 30.9 pg (ref 26.0–34.0)
MCHC: 33.4 g/dL (ref 30.0–36.0)
MCV: 92.6 fL (ref 80.0–100.0)
Platelets: 224 10*3/uL (ref 150–400)
RBC: 4.85 MIL/uL (ref 4.22–5.81)
RDW: 12.3 % (ref 11.5–15.5)
WBC: 6.4 10*3/uL (ref 4.0–10.5)
nRBC: 0 % (ref 0.0–0.2)

## 2023-07-03 LAB — TYPE AND SCREEN
ABO/RH(D): A POS
Antibody Screen: NEGATIVE

## 2023-07-03 LAB — SURGICAL PCR SCREEN
MRSA, PCR: NEGATIVE
Staphylococcus aureus: NEGATIVE

## 2023-07-03 NOTE — Progress Notes (Signed)
PCP - Dr. Jacquiline Doe Cardiologist - Denies  PPM/ICD - denies Device Orders - n/a Rep Notified - n/a  Chest x-ray - n/a EKG - n/a Stress Test - denies  ECHO - denies Cardiac Cath - denies  Sleep Study - denies CPAP - n/a  Fasting Blood Sugar - denies Checks Blood Sugar _____ times a day: n/a  Last dose of GLP1 agonist-  denies GLP1 instructions: n/a  Blood Thinner Instructions: n/a Aspirin Instructions: Y  ERAS Protcol - y PRE-SURGERY Ensure or G2- y  COVID TEST- n/a   Anesthesia review: y, on May 01, 2023 patient had a microdiscectomy done at [facility] cone with Dr. Christell Constant.  Patient denies shortness of breath, fever, cough and chest pain at PAT appointment. Patient denies any respiratory issues at this time.    All instructions explained to the patient, with a verbal understanding of the material. Patient agrees to go over the instructions while at home for a better understanding. Patient also instructed to self quarantine after being tested for COVID-19. The opportunity to ask questions was provided.

## 2023-07-06 NOTE — Anesthesia Preprocedure Evaluation (Signed)
Anesthesia Evaluation  Patient identified by MRN, date of birth, ID band Patient awake    Reviewed: Allergy & Precautions, NPO status , Patient's Chart, lab work & pertinent test results  Airway Mallampati: II  TM Distance: >3 FB Neck ROM: Full    Dental  (+) Teeth Intact, Dental Advisory Given   Pulmonary former smoker   Pulmonary exam normal breath sounds clear to auscultation       Cardiovascular negative cardio ROS Normal cardiovascular exam Rhythm:Regular Rate:Normal     Neuro/Psych  PSYCHIATRIC DISORDERS Anxiety Depression    LUMBAR RADICULOPATHY  Neuromuscular disease    GI/Hepatic negative GI ROS, Neg liver ROS,,,  Endo/Other    Class 3 obesity  Renal/GU negative Renal ROS     Musculoskeletal  (+) Arthritis ,    Abdominal   Peds  Hematology negative hematology ROS (+)   Anesthesia Other Findings   Reproductive/Obstetrics                             Anesthesia Physical Anesthesia Plan  ASA: 2  Anesthesia Plan: General   Post-op Pain Management: Tylenol PO (pre-op)* and Ketamine IV*   Induction: Intravenous  PONV Risk Score and Plan: 2 and Midazolam, Dexamethasone and Ondansetron  Airway Management Planned: Oral ETT  Additional Equipment:   Intra-op Plan:   Post-operative Plan: Extubation in OR  Informed Consent: I have reviewed the patients History and Physical, chart, labs and discussed the procedure including the risks, benefits and alternatives for the proposed anesthesia with the patient or authorized representative who has indicated his/her understanding and acceptance.     Dental advisory given  Plan Discussed with: CRNA  Anesthesia Plan Comments:        Anesthesia Quick Evaluation

## 2023-07-07 ENCOUNTER — Other Ambulatory Visit: Payer: Self-pay

## 2023-07-07 ENCOUNTER — Encounter (HOSPITAL_COMMUNITY)
Admission: RE | Disposition: A | Discharge status: Home / Self Care | Payer: Self-pay | Source: Home / Self Care | Attending: Orthopedic Surgery

## 2023-07-07 ENCOUNTER — Ambulatory Visit (HOSPITAL_COMMUNITY)
Admission: RE | Admit: 2023-07-07 | Discharge: 2023-07-07 | Disposition: A | Discharge status: Home / Self Care | Payer: Commercial Managed Care - HMO | Attending: Orthopedic Surgery | Admitting: Orthopedic Surgery

## 2023-07-07 ENCOUNTER — Ambulatory Visit (HOSPITAL_COMMUNITY): Payer: Commercial Managed Care - HMO

## 2023-07-07 ENCOUNTER — Ambulatory Visit (HOSPITAL_COMMUNITY): Payer: Commercial Managed Care - HMO | Admitting: Physician Assistant

## 2023-07-07 ENCOUNTER — Ambulatory Visit (HOSPITAL_BASED_OUTPATIENT_CLINIC_OR_DEPARTMENT_OTHER): Payer: Self-pay | Admitting: Anesthesiology

## 2023-07-07 ENCOUNTER — Encounter (HOSPITAL_COMMUNITY): Payer: Self-pay | Admitting: Orthopedic Surgery

## 2023-07-07 DIAGNOSIS — Z6829 Body mass index (BMI) 29.0-29.9, adult: Secondary | ICD-10-CM | POA: Diagnosis not present

## 2023-07-07 DIAGNOSIS — Z9889 Other specified postprocedural states: Secondary | ICD-10-CM | POA: Diagnosis not present

## 2023-07-07 DIAGNOSIS — M5126 Other intervertebral disc displacement, lumbar region: Secondary | ICD-10-CM

## 2023-07-07 DIAGNOSIS — M48061 Spinal stenosis, lumbar region without neurogenic claudication: Secondary | ICD-10-CM | POA: Insufficient documentation

## 2023-07-07 DIAGNOSIS — Z01818 Encounter for other preprocedural examination: Secondary | ICD-10-CM

## 2023-07-07 DIAGNOSIS — M5416 Radiculopathy, lumbar region: Secondary | ICD-10-CM

## 2023-07-07 DIAGNOSIS — M5116 Intervertebral disc disorders with radiculopathy, lumbar region: Secondary | ICD-10-CM | POA: Diagnosis not present

## 2023-07-07 HISTORY — PX: DECOMPRESSIVE LUMBAR LAMINECTOMY LEVEL 1: SHX5791

## 2023-07-07 LAB — ABO/RH: ABO/RH(D): A POS

## 2023-07-07 SURGERY — DECOMPRESSIVE LUMBAR LAMINECTOMY LEVEL 1
Anesthesia: General

## 2023-07-07 MED ORDER — ONDANSETRON HCL 4 MG/2ML IJ SOLN: 4.0000 mg | Freq: Once | INTRAMUSCULAR | Status: DC | PRN

## 2023-07-07 MED ORDER — METHYLPREDNISOLONE ACETATE 80 MG/ML IJ SUSP
INTRAMUSCULAR | Status: AC
  Filled 2023-07-07: qty 1

## 2023-07-07 MED ORDER — FENTANYL CITRATE (PF) 100 MCG/2ML IJ SOLN: 25.0000 ug | INTRAMUSCULAR | Status: DC | PRN

## 2023-07-07 MED ORDER — PROPOFOL 10 MG/ML IV BOLUS
INTRAVENOUS | Status: DC | PRN
  Administered 2023-07-07: 200 mg via INTRAVENOUS

## 2023-07-07 MED ORDER — DEXAMETHASONE SODIUM PHOSPHATE 10 MG/ML IJ SOLN
10.0000 mg | Freq: Once | INTRAMUSCULAR | Status: AC
  Administered 2023-07-07: 10 mg via INTRAVENOUS

## 2023-07-07 MED ORDER — POVIDONE-IODINE 10 % EX SWAB
2.0000 | Freq: Once | CUTANEOUS | Status: AC
  Administered 2023-07-07: 2 via TOPICAL

## 2023-07-07 MED ORDER — METHYLPREDNISOLONE ACETATE 80 MG/ML IJ SUSP
INTRAMUSCULAR | Status: DC | PRN
  Administered 2023-07-07: 80 mg

## 2023-07-07 MED ORDER — METHOCARBAMOL 500 MG PO TABS: 500.0000 mg | ORAL_TABLET | Freq: Three times a day (TID) | ORAL | Status: DC

## 2023-07-07 MED ORDER — AMISULPRIDE (ANTIEMETIC) 5 MG/2ML IV SOLN: 10.0000 mg | Freq: Once | INTRAVENOUS | Status: DC | PRN

## 2023-07-07 MED ORDER — SENNA 8.6 MG PO TABS: 1.0000 | ORAL_TABLET | Freq: Two times a day (BID) | ORAL | 0 refills | Status: AC

## 2023-07-07 MED ORDER — OXYCODONE HCL 5 MG PO TABS: 5.0000 mg | ORAL_TABLET | ORAL | 0 refills | Status: AC | PRN

## 2023-07-07 MED ORDER — OXYCODONE HCL 5 MG PO TABS
ORAL_TABLET | ORAL | Status: AC
  Administered 2023-07-07: 5 mg via ORAL
  Filled 2023-07-07: qty 2

## 2023-07-07 MED ORDER — FENTANYL CITRATE (PF) 250 MCG/5ML IJ SOLN
INTRAMUSCULAR | Status: AC
  Filled 2023-07-07: qty 5

## 2023-07-07 MED ORDER — LACTATED RINGERS IV SOLN: INTRAVENOUS | Status: DC

## 2023-07-07 MED ORDER — MIDAZOLAM HCL 2 MG/2ML IJ SOLN
INTRAMUSCULAR | Status: DC | PRN
  Administered 2023-07-07: 2 mg via INTRAVENOUS

## 2023-07-07 MED ORDER — MIDAZOLAM HCL 2 MG/2ML IJ SOLN
INTRAMUSCULAR | Status: AC
  Filled 2023-07-07: qty 2

## 2023-07-07 MED ORDER — CEFAZOLIN SODIUM-DEXTROSE 2-4 GM/100ML-% IV SOLN
2.0000 g | INTRAVENOUS | Status: AC
  Administered 2023-07-07 (×2): 2 g via INTRAVENOUS

## 2023-07-07 MED ORDER — ACETAMINOPHEN 500 MG PO TABS
ORAL_TABLET | ORAL | Status: AC
  Administered 2023-07-07: 1000 mg via ORAL
  Filled 2023-07-07: qty 2

## 2023-07-07 MED ORDER — METHOCARBAMOL 500 MG PO TABS
ORAL_TABLET | ORAL | Status: AC
  Administered 2023-07-07: 500 mg via ORAL
  Filled 2023-07-07: qty 1

## 2023-07-07 MED ORDER — CEFAZOLIN SODIUM-DEXTROSE 2-4 GM/100ML-% IV SOLN
INTRAVENOUS | Status: AC
  Filled 2023-07-07: qty 100

## 2023-07-07 MED ORDER — ORAL CARE MOUTH RINSE: 15.0000 mL | Freq: Once | OROMUCOSAL | Status: AC

## 2023-07-07 MED ORDER — ROCURONIUM BROMIDE 10 MG/ML (PF) SYRINGE
PREFILLED_SYRINGE | INTRAVENOUS | Status: AC
  Filled 2023-07-07: qty 50

## 2023-07-07 MED ORDER — SUGAMMADEX SODIUM 200 MG/2ML IV SOLN
INTRAVENOUS | Status: DC | PRN
  Administered 2023-07-07: 203.2 mg via INTRAVENOUS

## 2023-07-07 MED ORDER — HYDROMORPHONE HCL 1 MG/ML IJ SOLN
INTRAMUSCULAR | Status: AC
  Filled 2023-07-07: qty 0.5

## 2023-07-07 MED ORDER — TRANEXAMIC ACID-NACL 1000-0.7 MG/100ML-% IV SOLN
1000.0000 mg | INTRAVENOUS | Status: AC
Start: 2023-07-07 — End: 2023-07-07
  Administered 2023-07-07: 1000 mg via INTRAVENOUS

## 2023-07-07 MED ORDER — TRANEXAMIC ACID-NACL 1000-0.7 MG/100ML-% IV SOLN
INTRAVENOUS | Status: AC
  Filled 2023-07-07: qty 100

## 2023-07-07 MED ORDER — DEXAMETHASONE SODIUM PHOSPHATE 10 MG/ML IJ SOLN
INTRAMUSCULAR | Status: DC | PRN
  Administered 2023-07-07: 10 mg

## 2023-07-07 MED ORDER — 0.9 % SODIUM CHLORIDE (POUR BTL) OPTIME
TOPICAL | Status: DC | PRN
  Administered 2023-07-07: 1000 mL

## 2023-07-07 MED ORDER — PROPOFOL 10 MG/ML IV BOLUS
INTRAVENOUS | Status: AC
  Filled 2023-07-07: qty 20

## 2023-07-07 MED ORDER — FENTANYL CITRATE (PF) 250 MCG/5ML IJ SOLN
INTRAMUSCULAR | Status: DC | PRN
  Administered 2023-07-07: 50 ug via INTRAVENOUS
  Administered 2023-07-07 (×2): 100 ug via INTRAVENOUS

## 2023-07-07 MED ORDER — VECURONIUM BROMIDE 10 MG IV SOLR
INTRAVENOUS | Status: DC | PRN
Start: 2023-07-07 — End: 2023-07-07
  Administered 2023-07-07: 2 mg via INTRAVENOUS
  Administered 2023-07-07: 3 mg via INTRAVENOUS

## 2023-07-07 MED ORDER — SODIUM CHLORIDE 0.9 % IV SOLN: INTRAVENOUS | Status: DC | PRN

## 2023-07-07 MED ORDER — CHLORHEXIDINE GLUCONATE 0.12 % MT SOLN: 15.0000 mL | Freq: Once | OROMUCOSAL | Status: AC

## 2023-07-07 MED ORDER — VECURONIUM BROMIDE 10 MG IV SOLR
INTRAVENOUS | Status: AC
  Filled 2023-07-07: qty 10

## 2023-07-07 MED ORDER — LIDOCAINE 2% (20 MG/ML) 5 ML SYRINGE
INTRAMUSCULAR | Status: DC | PRN
  Administered 2023-07-07: 100 mg via INTRAVENOUS

## 2023-07-07 MED ORDER — HYDROMORPHONE HCL 1 MG/ML IJ SOLN
INTRAMUSCULAR | Status: DC | PRN
  Administered 2023-07-07: .5 mg via INTRAVENOUS

## 2023-07-07 MED ORDER — THROMBIN 20000 UNITS EX SOLR
CUTANEOUS | Status: AC
  Filled 2023-07-07: qty 20000

## 2023-07-07 MED ORDER — ACETAMINOPHEN 500 MG PO TABS: 1000.0000 mg | ORAL_TABLET | Freq: Once | ORAL | Status: AC

## 2023-07-07 MED ORDER — PHENYLEPHRINE 80 MCG/ML (10ML) SYRINGE FOR IV PUSH (FOR BLOOD PRESSURE SUPPORT)
PREFILLED_SYRINGE | INTRAVENOUS | Status: DC | PRN
  Administered 2023-07-07: 80 ug via INTRAVENOUS
  Administered 2023-07-07: 160 ug via INTRAVENOUS
  Administered 2023-07-07: 80 ug via INTRAVENOUS

## 2023-07-07 MED ORDER — VANCOMYCIN HCL 1000 MG IV SOLR
INTRAVENOUS | Status: AC
Start: 2023-07-07 — End: ?
  Filled 2023-07-07: qty 20

## 2023-07-07 MED ORDER — POLYETHYLENE GLYCOL 3350 17 G PO PACK: 17.0000 g | PACK | Freq: Every day | ORAL | 0 refills | Status: AC

## 2023-07-07 MED ORDER — ROCURONIUM BROMIDE 10 MG/ML (PF) SYRINGE
PREFILLED_SYRINGE | INTRAVENOUS | Status: DC | PRN
  Administered 2023-07-07 (×3): 30 mg via INTRAVENOUS
  Administered 2023-07-07: 40 mg via INTRAVENOUS
  Administered 2023-07-07: 60 mg via INTRAVENOUS

## 2023-07-07 MED ORDER — CHLORHEXIDINE GLUCONATE 0.12 % MT SOLN
OROMUCOSAL | Status: AC
  Administered 2023-07-07: 15 mL via OROMUCOSAL
  Filled 2023-07-07: qty 15

## 2023-07-07 MED ORDER — ACETAMINOPHEN 500 MG PO TABS: 1000.0000 mg | ORAL_TABLET | Freq: Three times a day (TID) | ORAL | 0 refills | Status: AC

## 2023-07-07 MED ORDER — ONDANSETRON HCL 4 MG PO TABS: 4.0000 mg | ORAL_TABLET | Freq: Three times a day (TID) | ORAL | 0 refills | Status: DC | PRN

## 2023-07-07 MED ORDER — OXYCODONE HCL 5 MG PO TABS: 5.0000 mg | ORAL_TABLET | Freq: Once | ORAL | Status: AC

## 2023-07-07 MED ORDER — VANCOMYCIN HCL 1000 MG IV SOLR
INTRAVENOUS | Status: DC | PRN
  Administered 2023-07-07: 1000 mg via TOPICAL

## 2023-07-07 MED ORDER — PHENYLEPHRINE HCL-NACL 20-0.9 MG/250ML-% IV SOLN: INTRAVENOUS | Status: DC | PRN

## 2023-07-07 MED ORDER — METHOCARBAMOL 750 MG PO TABS: 750.0000 mg | ORAL_TABLET | Freq: Four times a day (QID) | ORAL | 0 refills | Status: AC

## 2023-07-07 MED ORDER — ONDANSETRON HCL 4 MG/2ML IJ SOLN
INTRAMUSCULAR | Status: DC | PRN
  Administered 2023-07-07: 4 mg via INTRAVENOUS

## 2023-07-07 MED ORDER — THROMBIN 20000 UNITS EX SOLR
CUTANEOUS | Status: DC | PRN
  Administered 2023-07-07: 20 mL via TOPICAL

## 2023-07-07 SURGICAL SUPPLY — 42 items
BENZOIN TINCTURE AMPULE (MISCELLANEOUS) ×1 IMPLANT
BENZOIN TINCTURE PRP APPL 2/3 (GAUZE/BANDAGES/DRESSINGS) ×1 IMPLANT
BUR MATCHSTICK NEURO 3.0 LAGG (BURR) ×1 IMPLANT
CANISTER SUCT 3000ML PPV (MISCELLANEOUS) ×1 IMPLANT
COVER MAYO STAND STRL (DRAPES) ×3 IMPLANT
COVER SURGICAL LIGHT HANDLE (MISCELLANEOUS) ×1 IMPLANT
DRAIN HEMOVAC 7FR (DRAIN) IMPLANT
DRAPE C-ARM 42X72 X-RAY (DRAPES) ×1 IMPLANT
DRAPE UTILITY XL STRL (DRAPES) ×2 IMPLANT
DRESSING MEPILEX FLEX 4X4 (GAUZE/BANDAGES/DRESSINGS) ×1 IMPLANT
DRSG MEPILEX FLEX 4X4 (GAUZE/BANDAGES/DRESSINGS) IMPLANT
DRSG MEPILEX POST OP 4X8 (GAUZE/BANDAGES/DRESSINGS) ×1 IMPLANT
DRSG TEGADERM 4X4.75 (GAUZE/BANDAGES/DRESSINGS) ×3 IMPLANT
DURAPREP 26ML APPLICATOR (WOUND CARE) ×1 IMPLANT
ELECT COATED BLADE 2.86 ST (ELECTRODE) IMPLANT
ELECT PENCIL ROCKER SW 15FT (MISCELLANEOUS) ×1 IMPLANT
ELECT REM PT RETURN 9FT ADLT (ELECTROSURGICAL) ×1 IMPLANT
ELECTRODE REM PT RTRN 9FT ADLT (ELECTROSURGICAL) ×1 IMPLANT
GAUZE SPONGE 4X4 12PLY STRL (GAUZE/BANDAGES/DRESSINGS) ×1 IMPLANT
GLOVE BIO SURGEON STRL SZ7.5 (GLOVE) ×1 IMPLANT
GLOVE INDICATOR 7.5 STRL GRN (GLOVE) ×1 IMPLANT
GOWN STRL SURGICAL XL XLNG (GOWN DISPOSABLE) ×1 IMPLANT
KIT BASIN OR (CUSTOM PROCEDURE TRAY) ×1 IMPLANT
KIT POSITION SURG JACKSON T1 (MISCELLANEOUS) ×1 IMPLANT
KIT TURNOVER KIT B (KITS) ×1 IMPLANT
NS IRRIG 1000ML POUR BTL (IV SOLUTION) ×1 IMPLANT
PACK LAMINECTOMY ORTHO (CUSTOM PROCEDURE TRAY) ×1 IMPLANT
PATTIES SURGICAL .5 X.5 (GAUZE/BANDAGES/DRESSINGS) IMPLANT
SPONGE SURGIFOAM ABS GEL 100 (HEMOSTASIS) ×1 IMPLANT
STRIP CLOSURE SKIN 1/2X4 (GAUZE/BANDAGES/DRESSINGS) IMPLANT
SUCTION TUBE FRAZIER 10FR DISP (SUCTIONS) ×1 IMPLANT
SUCTION TUBE FRAZIER 12FR DISP (SUCTIONS) IMPLANT
SUT BONE WAX W31G (SUTURE) ×1 IMPLANT
SUT MNCRL AB 3-0 PS2 27 (SUTURE) ×1 IMPLANT
SUT MNCRL+ AB 3-0 CT1 36 (SUTURE) ×1 IMPLANT
SUT VIC AB 0 CT1 18XCR BRD8 (SUTURE) ×1 IMPLANT
SUT VIC AB 2-0 CT1 18 (SUTURE) ×1 IMPLANT
SUT VIC AB 2-0 CT2 18 VCP726D (SUTURE) ×1 IMPLANT
TOWEL GREEN STERILE (TOWEL DISPOSABLE) ×1 IMPLANT
TOWEL GREEN STERILE FF (TOWEL DISPOSABLE) ×1 IMPLANT
TUBING FEATHERFLOW (TUBING) ×1 IMPLANT
WATER STERILE IRR 1000ML POUR (IV SOLUTION) ×1 IMPLANT

## 2023-07-07 NOTE — Transfer of Care (Signed)
Immediate Anesthesia Transfer of Care Note  Patient: Jonathan Strong  Procedure(s) Performed: L3-4 DECOMPRESSIVE LUMBAR LAMINECTOMY, L4-5 REVISION DECOMPRESSION AND DISCECTOMY  Patient Location: PACU  Anesthesia Type:General  Level of Consciousness: drowsy  Airway & Oxygen Therapy: Patient connected to face mask oxygen  Post-op Assessment: Post -op Vital signs reviewed and stable  Post vital signs: stable  Last Vitals:  Vitals Value Taken Time  BP 122/77 07/07/23 1430  Temp    Pulse 104 07/07/23 1431  Resp 17 07/07/23 1431  SpO2 96 % 07/07/23 1431  Vitals shown include unfiled device data.  Last Pain:  Vitals:   07/07/23 0619  TempSrc:   PainSc: 2          Complications: There were no known notable events for this encounter.

## 2023-07-07 NOTE — Brief Op Note (Signed)
07/07/2023  2:40 PM  PATIENT:  Jonathan Strong  37 y.o. male  PRE-OPERATIVE DIAGNOSIS:  LUMBAR RADICULOPATHY  POST-OPERATIVE DIAGNOSIS:  LUMBAR RADICULOPATHY  PROCEDURE:  Procedure(s): L3-4 DECOMPRESSIVE LUMBAR LAMINECTOMY, L4-5 REVISION DECOMPRESSION AND DISCECTOMY (N/A)  SURGEON:  Surgeons and Role:    * London Sheer, MD - Primary  PHYSICIAN ASSISTANT: none  ASSISTANTS: none   ANESTHESIA:   general  EBL:  75 mL   BLOOD ADMINISTERED:none  DRAINS: none   LOCAL MEDICATIONS USED:  NONE  SPECIMEN:  No Specimen  DISPOSITION OF SPECIMEN:  N/A  COUNTS:  YES  TOURNIQUET:  NONE  DICTATION: .Note written in EPIC  PLAN OF CARE: Discharge to home after PACU  PATIENT DISPOSITION:  PACU - hemodynamically stable.   Delay start of Pharmacological VTE agent (>24hrs) due to surgical blood loss or risk of bleeding: yes

## 2023-07-07 NOTE — Anesthesia Procedure Notes (Signed)
Procedure Name: Intubation Date/Time: 07/07/2023 7:41 AM  Performed by: Hessie Diener, CRNAPre-anesthesia Checklist: Patient identified, Emergency Drugs available, Suction available and Patient being monitored Patient Re-evaluated:Patient Re-evaluated prior to induction Oxygen Delivery Method: Circle System Utilized Preoxygenation: Pre-oxygenation with 100% oxygen Induction Type: IV induction Ventilation: Mask ventilation without difficulty Laryngoscope Size: Mac and 3 Grade View: Grade I Tube type: Oral Tube size: 7.5 mm Number of attempts: 1 Airway Equipment and Method: Stylet and Oral airway Placement Confirmation: ETT inserted through vocal cords under direct vision, positive ETCO2 and breath sounds checked- equal and bilateral Secured at: 23 cm Tube secured with: Tape Dental Injury: Teeth and Oropharynx as per pre-operative assessment

## 2023-07-07 NOTE — Discharge Summary (Signed)
Orthopedic Surgery Discharge Summary  Patient name: Jonathan Strong Patient MRN: [id number] Date of surgery: 07/07/2023 Discharge date: 07/07/2023  Attending physician: Willia Craze, MD Final diagnosis: lumbar radiculopathy, lumbar stenosis, recurrent L4/5 disc herniation Findings: recurrent disc herniation at L4/5, hypertrophic ligamentum at L3/4, increased epidural fat at L3/4  Hospital course: Patient is a 37 y.o. male who underwent L3/4 laminectomy and revision discectomy on 07/07/2023. Pre-operative plan was for this to be an outpatient surgery. There were no complications intra-operatively so plan was still for discharge after PACU. While in PACU, he had significant pain, but pain eventually was controlled with a multimodal regimen including oxycodone. He was tolerated PO, his vitals were stable, his neuro exam was stable, and he walked the halls in PACU. The patient was medically ready for discharge and was discharge to home on the same day of surgery.   Instructions:   Orthopedic Surgery Discharge Instructions  Patient name: Jonathan Strong Procedure Performed: L3/4 laminectomy, L4/5 left-sided revision hemilaminotomy and microdiscectomy Date of Surgery: 07/07/2023 Surgeon: Willia Craze, MD  Pre-operative Diagnosis: lumbar radiculopathy, lumbar stenosis Post-operative Diagnosis: same as above and recurrent L4/5 disc herniation  Discharge Date: 07/07/2023 Discharged to: home Discharge Condition: stable  Activity: You should refrain from bending, lifting, or twisting with objects greater than ten pounds until six weeks after surgery. You are encouraged to walk as much as desired. You can perform household activities such as cleaning dishes, doing laundry, vacuuming, etc. as long as the ten-pound restriction is followed. You do not need to wear a brace during the post-operative period.   Incision Care: Your incision site has a dressing over it. That dressing should  remain in place and dry at all times for a total of one week after surgery. After one week, you can remove the dressing. Underneath the dressing, you will find pieces of tape. You should leave these pieces of tape in place. They will fall off with time. Do not pick, rub, or scrub at them. Do not put cream or lotion over the surgical area. After one week and once the dressing is off, it is okay to let soap and water run over your incision. Again, do not pick, scrub, or rub at the pieces of tape when bathing. Do not submerge (e.g., take a bath, swim, go in a hot tub, etc.) until six weeks after surgery. There may be some bloody drainage from the incision into the dressing after surgery. This is normal. You do not need to replace the dressing. Continue to leave it in place for the one week as instructed above. Should the dressing become saturated with blood or drainage, please call the office for further instructions.   Medications: You have been prescribed oxycodone. This is a narcotic pain medication and should only be taken as prescribed. You should not drink alcohol or operate heavy machinery (including driving) while taking this medication. The oxycodone can cause constipation as a side effect. For that reason, you have been prescribed senna and miralax. These are both laxatives. You do not need to take this medication if you develop diarrhea. Should you remain constipated even while taking these medications, please increase the dose of miralax to twice daily. Tylenol has been prescribed to be taken every 8 hours, which will give you additional pain relief. Robaxin is a muscle relaxer that has been prescribed to you for muscle spasm type pain. Take this medication as needed. Zofran is an antinausea medication. If you feel nauseous when  taking the oxycodone, you can use this medication. If you are not nauseous, you do not need to take the Zofran.   Do not take NSAIDs (ibuprofen, Aleve, Celebrex, naproxen,  meloxicam, etc.) for 72 hours after surgery since these medications can thin your blood. After 72 hours, it is safe to take these medications.   In order to set expectations for opioid prescriptions, you will only be prescribed opioids for a total of six weeks after surgery and, at two-weeks after surgery, your opioid prescription will start to tapered (decreased dosage and number of pills). If you have ongoing need for opioid medication six weeks after surgery, you will be referred to pain management. If you are already established with a provider that is giving you opioid medications, you should schedule an appointment with them for six weeks after surgery if you feel you are going to need another prescription. State law only allows for opioid prescriptions one week at a time. If you are running out of opioid medication near the end of the week, please call the office during business hours before running out so I can send you another prescription.   Driving: You should not drive while taking narcotic pain medications. You should start getting back to driving slowly and you may want to try driving in a parking lot before doing anything more.   Diet: You are safe to resume your regular diet after surgery.   Reasons to Call the Office After Surgery: You should feel free to call the office with any concerns or questions you have in the post-operative period, but you should definitely notify the office if you develop: -shortness of breath, chest pain, or trouble breathing -excessive bleeding, drainage, redness, or swelling around the surgical site -fevers, chills, or pain that is getting worse with each passing day -persistent nausea or vomiting -new weakness in either leg -new or worsening numbness or tingling in either leg -numbness in the groin, bowel or bladder incontinence -other concerns about your surgery  Follow Up Appointments: You should have an office appointment scheduled for approximately  two weeks after surgery. If you do not remember when this appointment is or do not already have it scheduled, please call the office to schedule.   Office Information:  -Willia Craze, MD -Phone number: [phone] -Address: [address]       [place], [place] [address]

## 2023-07-07 NOTE — Anesthesia Postprocedure Evaluation (Signed)
Anesthesia Post Note  Patient: Jonathan Strong  Procedure(s) Performed: L3-4 DECOMPRESSIVE LUMBAR LAMINECTOMY, L4-5 REVISION DECOMPRESSION AND DISCECTOMY     Patient location during evaluation: PACU Anesthesia Type: General Level of consciousness: awake and alert Pain management: pain level controlled Vital Signs Assessment: post-procedure vital signs reviewed and stable Respiratory status: spontaneous breathing, nonlabored ventilation and respiratory function stable Cardiovascular status: blood pressure returned to baseline and stable Postop Assessment: no apparent nausea or vomiting Anesthetic complications: no   There were no known notable events for this encounter.  Last Vitals:  Vitals:   07/07/23 1430 07/07/23 1445  BP: 122/77 135/88  Pulse: 98 93  Resp: 12 19  Temp: 36.9 C   SpO2: 94% 95%    Last Pain:  Vitals:   07/07/23 1500  TempSrc:   PainSc: Asleep                 Collene Schlichter

## 2023-07-07 NOTE — H&P (Signed)
Orthopedic Spine Surgery H&P Note  Assessment: Patient is a 37 y.o. male with pain in bilateral lower extremities (L>R) consistent with radiculopathy   Plan: -Out of bed as tolerated, activity as tolerated, no brace -Covered the risks of surgery one more time with the patient and patient elected to proceed with planned surgery -Written consent verified -Hold anticoagulation in anticipation of surgery -Ancef and TXA on all to OR -NPO for procedure -Site marked -To OR when ready  The risks of surgery discussed this morning included but were not limited to: iatrogenic instability, dural tear, nerve root injury, paralysis, persistent pain, infection, bleeding, recurrent disc herniation, heart attack, death, stroke, fracture, blindness, and need for additional procedures. Explained that his risk for infection and durotomy would be higher since this is a revision surgery.  ___________________________________________________________________________  Chief Complaint: pain radiating into both lower extremities  History: Patient is 37 y.o. male who had undergone microdiscectomy that has developed pain radiating into bilateral lower extremities. His previous MRI showed central stenosis above the disc herniation. It was recommended to do a laminectomy at that level as well as the discectomy at the level below. Patient only wanted to undergo the discectomy. He did well for about a week after the discectomy but then noted pain in bilateral lower extremities. More significant on the left. His symptoms failed to improve with conservative treatment so operative management was discussed. The patient presents today with no changes in their symptoms since the last office visit. See previous office/telephone notes for further details.    Review of systems: General: denies fevers and chills, myalgias Neurologic: denies recent changes in vision, slurred speech Abdomen: denies nausea, vomiting,  hematemesis Respiratory: denies cough, shortness of breath  Past medical history:  Hyperlipidemia Depression Chronic pain   Allergies: Narcotics, gabapentin   Past surgical history:  None   Social history: Reports use of nicotine product (smoking, vaping, patches, smokeless) Alcohol use: Yes, about 7 drinks per week Denies recreational drug use   Family history: -reviewed and not pertinent to herniated disc   Physical Exam:  BMI of 30.3  General: no acute distress, appears stated age Neurologic: alert, answering questions appropriately, following commands Cardiovascular: regular rate, no cyanosis Respiratory: unlabored breathing on room air, symmetric chest rise Psychiatric: appropriate affect, normal cadence to speech   MSK (spine):  -Strength exam      Left  Right EHL    4/5  5/5 TA    5/5  5/5 GSC    5/5  5/5 Knee extension  5/5  5/5 Knee flexion   5/5  5/5 Hip flexion   5/5  5/5  -Sensory exam    Sensation intact to light touch in L3-S1 nerve distributions of bilateral lower extremities   Patient name: Keneth Etchells Patient MRN: [id number] Date: 07/07/23

## 2023-07-07 NOTE — Op Note (Signed)
Orthopedic Spine Surgery Operative Report  Procedure: L3, L4 lumbar laminectomy L4/5 revision left-sided hemilaminotomy and microdiscectomy Intraoperative use of microscope  Modifier: none  Date of procedure: 07/07/2023  Patient name: Jonathan Strong MRN: [id number] DOB: 17-Oct-1985  Surgeon: Willia Craze, MD Assistant: None Pre-operative diagnosis: lumbar stenosis, lumbar radiculopathy Post-operative diagnosis: lumbar stenosis, lumbar radiculopathy, recurrent L4/5 disc herniation Findings: L3/4 thickened ligamentum flavum and increased epidural fat, recurrent L4/5 disc herniation  Specimens: none Anesthesia: general EBL: 75cc Complications: none Pre-incision antibiotic: ancef TXA given prior to incision as well  Implants: none   Indication for procedure: Patient is a 37 y.o. male who had undergone L4/5 microdiscectomy. He had noted some but not complete relief of his symptoms. His pain then got worse about 1 week after surgery. His symptoms failed to improve with conservative treatment. I tried to obtain an MRI to evaluate for recurrent disc herniation, but his insurance company denied the MRI. The patient wanted to proceed with surgery because he had been dealing with symptoms for many months at this point and was trying to get back to work. His previous MRI showed stenosis at L3/4 so plan was made for laminectomy at that level. Since I had concern about possible recurrent disc herniation but I could not get an MRI, I told him that I could explore the prior microdiscectomy while in surgery to look for a recurrence. If I were to find a disc herniation, I would perform a revision microdiscectomy. The risks including but not limited to recurrent disc herniation, iatrogenic instability, dural tear, nerve root injury, paralysis, persistent pain, infection, bleeding, heart attack, death, fracture, blindness, and need for additional procedures were discussed with the patient. The benefit  of the surgery would be relief of his radiating leg pain. The alternatives to surgical management were covered with the patient and included continued monitoring, physical therapy, over-the-counter pain medications, ambulatory aids, injections, and activity modification. All the patient's questions were answered to his satisfaction. After this discussion, the patient expressed understanding and elected to proceed with surgical intervention.   Procedure Description: The patient was met in the pre-operative holding area. The patient's identity and consent were verified. The operative site was marked. The patient's remaining questions about the surgery were answered. The patient was brought back to the operating room. General anesthesia was induced and an endotracheal tube was placed by the anesthesia staff. The patient was transferred to the prone [place] table in the prone position. All bony prominences were well padded. The head of the bed was slightly elevated and the eyes were free from compression by the face pillow. The surgical area was cleansed with alcohol. The patient's skin was then prepped and draped in a standard, sterile fashion. A time out was performed that identified the patient, the procedure, and the operative levels. All team members agreed with what was stated in the time out.   A midline incision over the spinous processes in line with the prior incision and extending cranially was taken down sharply through the skin and dermis. Electrocautery was then used to continue the midline dissection down to the level of the spinous process. Subperiosteal dissection was performed using electrocautery to expose the lamina out lateral to the facet joint capsule on the right side. Care was taken to not violate the facet joint capsules. A lateral fluoroscopic image was taken to confirm the level. Subperiosteal dissection with electrocautery was then done to expose the L3 lamina and cranial aspect of the  L4 lamina.  The L3  pars interarticularis was also exposed. More caudally, on the right, electrocautery was used to expose to the approximate level of the L4 lamina.     A rongeur was used to remove the spinous processes and interspinous ligaments between the L2/3 interspinous ligament to the cranial portion of the L4 spinous process. Bone wax was used to obtain hemostasis at the bleeding bony surfaces. A high-speed burr was used to thin the lamina at L3 and L4 to the level of the ligamentum flavum. Above the level of the ligamentum, the lamina was thinned with the burr to the approximate level of the ligamentum. Care was taken to leave at least 8mm of pars interarticularis on each side. A series of Kerrison rongeurs were used to remove the remaining lamina and ligamentum overlying the thecal sac. A woodsen was used to protect the thecal sac in the lateral recess as the ligamentum was removed in that area. The same process was repeated on the contralateral side.   A woodsen was placed into the laminectomy site to palpate for any remaining areas of stenosis. No further areas of stenosis were felt.   Electrocautery was used to expose the lateral aspect of the remaining L4 lamina. A curette was used to define the hemilaminotomy defect from his prior surgery. A burr was used the thin the lamina cranial to the prior hemilaminotomy defect. A kerrison was used to remove the lamina that was just thinned on the left side. At this point, there was scar tissue encountered overlying the thecal sac and L5 nerve root. Microscope was brought in to improve visualization and allow for microdissection of scar tissue off of the thecal sac. A forward angle curette was then used along the remaining lamina. A kerrison was used to remove further left-side hemilamina. A curette was used to dissect scar tissue off of the thecal sac. A kerrison was used to remove the scar tissue. This use of the kerrison and curette was repeated until  the lateral aspect of the thecal sac and the L5 nerve root were exposed. A penfield was then used to mobilize the thecal sac and L5 nerve root medially. A nerve root retractor was used retract the nerve root and thecal sac. A disc herniation was noted in the lateral recess. A combination of straight and upbiting pituitaries were used to remove the herniated disc. A downward facing curette was used to push further bulging disc into the disc space which was then remove with a pituitary. The downward facing curette was used to palpate for any remaining disc in the lateral recess or more centrally. No further disc herniation or bulge was felt. The lateral recess was well visualized and there was no reaming disc herniation seen. A nerve hook was placed into the annulotomy to try to remove any remaining loose disc fragments. No further loose fragments were found. The microscope was then taken away from the field.   The wound was copiously irrigated with sterile saline. Hemostasis was achieved.  500mg  of vancomycin powder was placed into the wound. The fascia was reapproximated with 0 vicryl suture. The subcutaneous fat was reapproximated with 0 vicryl suture. The deep dermal layer was reapproximated with 2-0 vicryl. The skin as closed with a 3-0 running monocryl. All counts were correct at the end of the case. The incision was dressed with steri strips and benzoine. An island dressing was placed over the wound. The patient was transferred back to a bed and brought to the post-anesthesia care  unit by anesthesia staff in stable condition.  Post-operative plan: The patient will recover in the post-anesthesia care unit. Once his vitals are stable, neuro exam is stable, he voids, and he ambulates the halls, he will be ready for discharge. The patient will be out of bed as tolerated with no brace. The patient will be seen in the office approximately 2 weeks from the date of surgery.    Willia Craze, MD Orthopedic  Surgeon

## 2023-07-07 NOTE — Progress Notes (Addendum)
Orthopedic Surgery Post-operative Progress Note  Assessment: Patient is a 37 y.o. male who is currently admitted after undergoing L3/4 laminectomy, L4/5 revision discectomy   Plan: -Operative plans complete -Drains: none -Out of bed as tolerated, no brace -No bending/lifting/twisting greater than 10 pounds -Pain control -Regular diet -No chemoprophylaxis for dvt or antiplatelets for 72 hours after surgery -Anticipate discharge to home  ___________________________________________________________________________   Subjective: No acute events since surgery. Recovering in PACU. Pain controlled. Sleeping but awakes to voice.   Update: Saw patient again when he was more awake. Was having some pain around where the chest pad rests. Having back pain. No radiating leg pain.   Objective:  General: no acute distress, laying in bed Neurologic: sleeping but awakes to voice, drowsy, following commands Respiratory: unlabored breathing on nasal canula Skin: dressing clear/dry/intact  MSK (spine):  -Strength exam      Right  Left  EHL    5/5  5/5 TA    5/5  5/5 GSC    5/5  5/5 Knee extension  5/5  5/5 Hip flexion   5/5  5/5  -Sensory exam    Sensation intact to light touch in L3-S1 nerve distributions of bilateral lower extremities   Patient name: Jonathan Strong Patient MRN: [id number] Date: 07/07/23

## 2023-07-07 NOTE — Discharge Instructions (Signed)
Orthopedic Surgery Discharge Instructions  Patient name: Jonathan Strong Procedure Performed: L3/4 laminectomy, L4/5 left-sided revision hemilaminotomy and microdiscectomy Date of Surgery: 07/07/2023 Surgeon: Willia Craze, MD  Pre-operative Diagnosis: lumbar radiculopathy, lumbar stenosis Post-operative Diagnosis: same as above and recurrent L4/5 disc herniation  Discharge Date: 07/07/2023 Discharged to: home Discharge Condition: stable  Activity: You should refrain from bending, lifting, or twisting with objects greater than ten pounds until six weeks after surgery. You are encouraged to walk as much as desired. You can perform household activities such as cleaning dishes, doing laundry, vacuuming, etc. as long as the ten-pound restriction is followed. You do not need to wear a brace during the post-operative period.   Incision Care: Your incision site has a dressing over it. That dressing should remain in place and dry at all times for a total of one week after surgery. After one week, you can remove the dressing. Underneath the dressing, you will find pieces of tape. You should leave these pieces of tape in place. They will fall off with time. Do not pick, rub, or scrub at them. Do not put cream or lotion over the surgical area. After one week and once the dressing is off, it is okay to let soap and water run over your incision. Again, do not pick, scrub, or rub at the pieces of tape when bathing. Do not submerge (e.g., take a bath, swim, go in a hot tub, etc.) until six weeks after surgery. There may be some bloody drainage from the incision into the dressing after surgery. This is normal. You do not need to replace the dressing. Continue to leave it in place for the one week as instructed above. Should the dressing become saturated with blood or drainage, please call the office for further instructions.   Medications: You have been prescribed oxycodone. This is a narcotic pain medication  and should only be taken as prescribed. You should not drink alcohol or operate heavy machinery (including driving) while taking this medication. The oxycodone can cause constipation as a side effect. For that reason, you have been prescribed senna and miralax. These are both laxatives. You do not need to take this medication if you develop diarrhea. Should you remain constipated even while taking these medications, please increase the dose of miralax to twice daily. Tylenol has been prescribed to be taken every 8 hours, which will give you additional pain relief. Robaxin is a muscle relaxer that has been prescribed to you for muscle spasm type pain. Take this medication as needed. Zofran is an antinausea medication. If you feel nauseous when taking the oxycodone, you can use this medication. If you are not nauseous, you do not need to take the Zofran.   Do not take NSAIDs (ibuprofen, Aleve, Celebrex, naproxen, meloxicam, etc.) for 72 hours after surgery since these medications can thin your blood. After 72 hours, it is safe to take these medications.   In order to set expectations for opioid prescriptions, you will only be prescribed opioids for a total of six weeks after surgery and, at two-weeks after surgery, your opioid prescription will start to tapered (decreased dosage and number of pills). If you have ongoing need for opioid medication six weeks after surgery, you will be referred to pain management. If you are already established with a provider that is giving you opioid medications, you should schedule an appointment with them for six weeks after surgery if you feel you are going to need another prescription. State law  only allows for opioid prescriptions one week at a time. If you are running out of opioid medication near the end of the week, please call the office during business hours before running out so I can send you another prescription.   Driving: You should not drive while taking narcotic  pain medications. You should start getting back to driving slowly and you may want to try driving in a parking lot before doing anything more.   Diet: You are safe to resume your regular diet after surgery.   Reasons to Call the Office After Surgery: You should feel free to call the office with any concerns or questions you have in the post-operative period, but you should definitely notify the office if you develop: -shortness of breath, chest pain, or trouble breathing -excessive bleeding, drainage, redness, or swelling around the surgical site -fevers, chills, or pain that is getting worse with each passing day -persistent nausea or vomiting -new weakness in either leg -new or worsening numbness or tingling in either leg -numbness in the groin, bowel or bladder incontinence -other concerns about your surgery  Follow Up Appointments: You should have an office appointment scheduled for approximately two weeks after surgery. If you do not remember when this appointment is or do not already have it scheduled, please call the office to schedule.   Office Information:  -Willia Craze, MD -Phone number: [phone] -Address: [address]       [place], [place] [address]

## 2023-07-08 ENCOUNTER — Telehealth: Payer: Self-pay

## 2023-07-08 ENCOUNTER — Encounter (HOSPITAL_COMMUNITY): Payer: Self-pay | Admitting: Orthopedic Surgery

## 2023-07-08 NOTE — Telephone Encounter (Signed)
Patient left VM on Triage line states he had surgery yesterday L3-4 DECOMPRESSIVE LUMBAR LAMINECTOMY, L4-5 REVISION DECOMPRESSION AND DISCECTOMY. States that he was laying face down and has swelling on his forehead. They told him it would get better but its getting worse and spreading near eyes. Also states he is getting boils. Would like a call back ASAP.    CB [phone]

## 2023-07-09 ENCOUNTER — Encounter: Payer: Self-pay | Admitting: Orthopedic Surgery

## 2023-07-09 MED ORDER — PREDNISONE 10 MG PO TABS: 10.0000 mg | ORAL_TABLET | Freq: Every day | ORAL | 0 refills | Status: AC

## 2023-07-10 ENCOUNTER — Ambulatory Visit (INDEPENDENT_AMBULATORY_CARE_PROVIDER_SITE_OTHER): Payer: Commercial Managed Care - HMO | Admitting: Orthopedic Surgery

## 2023-07-10 ENCOUNTER — Encounter: Payer: Self-pay | Admitting: Orthopedic Surgery

## 2023-07-10 VITALS — Ht 73.0 in | Wt 224.0 lb

## 2023-07-10 DIAGNOSIS — Z9889 Other specified postprocedural states: Secondary | ICD-10-CM

## 2023-07-10 NOTE — Progress Notes (Signed)
Orthopedic Surgery Post-operative Office Visit  Procedure: L3/4 laminectomy, L4/5 revision microdiscectomy Date of Surgery: 07/07/2023 (~3 days post-op)  Assessment: Patient is a 37 y.o. who comes in today for bloody drainage   Plan: -Operative plans complete -Hematoma was milked from the incision. No further hematoma was felt. The skin was sterilized with alcohol and betadine. Dermabond and then steri strips were applied over the skin. A new dressing was applied. Patient was given some dressing supplies to go home with in case this happens again -No bending/lifting/twisting greater than 10 pounds -Keep dressing on and dry at all times -Pain management: oxycodone -Return to office in ~2 weeks, lumbar x-rays needed at next visit: none  ___________________________________________________________________________   Subjective: Patient comes in this afternoon because he has noticed bloody drainage in his dressing.  The dressing was saturated.  He said it started after he was straining on the toilet.  He called the office and we were able to get him an appointment.  His pain is currently under control with oxycodone.  He had had no drainage prior to today.  Objective:  General: no acute distress, appropriate affect Neurologic: alert, answering questions appropriately, following commands Respiratory: unlabored breathing on room air Skin: incision is well-approximated, there is a fullness palpable underneath the skin, dark-colored hematoma is able to be expressed out of the wound, no purulent material seen coming from the wound, no erythema around the wound, hematoma is draining from the more distal aspect of the incision  MSK (spine):  -Strength exam      Left  Right  EHL    5/5  5/5 TA    5/5  5/5 GSC    5/5  5/5 Knee extension  5/5  5/5 Hip flexion   5/5  5/5  -Sensory exam    Sensation intact to light touch in L3-S1 nerve distributions of bilateral lower  extremities  Imaging: None obtained at today's visit   Patient name: Jonathan Strong Patient MRN: [id number] Date of visit: 07/10/23

## 2023-07-15 ENCOUNTER — Other Ambulatory Visit: Payer: Self-pay | Admitting: *Deleted

## 2023-07-15 DIAGNOSIS — M545 Low back pain, unspecified: Secondary | ICD-10-CM

## 2023-07-15 MED ORDER — DIAZEPAM 10 MG PO TABS: 10.0000 mg | ORAL_TABLET | Freq: Three times a day (TID) | ORAL | 5 refills | Status: DC | PRN

## 2023-07-15 NOTE — Telephone Encounter (Signed)
Copied from CRM [id number]. Topic: Clinical - Medication Refill >> Jul 15, 2023  8:18 AM Almira Coaster wrote: Most Recent Primary Care Visit:  Provider: [name]  Department: [facility]  Visit Type: SAME DAY  Date: 05/04/2023  Medication: diazepam (VALIUM) 10 MG tablet  Has the patient contacted their pharmacy? No (Agent: If no, request that the patient contact the pharmacy for the refill. If patient does not wish to contact the pharmacy document the reason why and proceed with request.) (Agent: If yes, when and what did the pharmacy advise?)  Is this the correct pharmacy for this prescription? Yes If no, delete pharmacy and type the correct one.  This is the patient's preferred pharmacy:  [id number] - [place], [place] - [address] AT [address] [place] [address] Phone: [phone] Fax: [phone]   Has the prescription been filled recently? No  Is the patient out of the medication? Yes  Has the patient been seen for an appointment in the last year OR does the patient have an upcoming appointment? Yes  Can we respond through MyChart? Yes  Agent: Please be advised that Rx refills may take up to 3 business days. We ask that you follow-up with your pharmacy.

## 2023-07-20 ENCOUNTER — Ambulatory Visit (INDEPENDENT_AMBULATORY_CARE_PROVIDER_SITE_OTHER): Payer: Commercial Managed Care - HMO | Admitting: Orthopedic Surgery

## 2023-07-20 DIAGNOSIS — Z9889 Other specified postprocedural states: Secondary | ICD-10-CM

## 2023-07-20 MED ORDER — DOXYCYCLINE HYCLATE 100 MG PO TABS: 100.0000 mg | ORAL_TABLET | Freq: Two times a day (BID) | ORAL | 0 refills | Status: AC

## 2023-07-20 NOTE — Progress Notes (Signed)
Orthopedic Surgery Post-operative Office Visit   Procedure: L3/4 laminectomy, L4/5 revision microdiscectomy Date of Surgery: 07/07/2023 (~2 weeks post-op)   Assessment: Patient is a 37 y.o. who is doing well after surgery     Plan: -Operative plans complete -Proximal aspect of incision still with small amount of serous drainage. None on my exam but patient has noticed some on his dressing. Prescribed doxycycline. If he has continued drainage into the next week, I instructed him to call as we may have to do an I&D -Okay to let soap/water run over incision, but do not submerge -No bending/lifting/twisting greater than 10 pounds -Pain management: OTC medications -Return to office in ~4 weeks, lumbar x-rays needed at next visit: none   ___________________________________________________________________________     Subjective: Patient's radiating leg pain has improved since surgery. He is not having any back pain. He has noticed some serous drainage from the incision. No erythema has been seen. No purulent or foul-smelling drainage noted.    Objective:   General: no acute distress, appropriate affect Neurologic: alert, answering questions appropriately, following commands Respiratory: unlabored breathing on room air Skin: proximal aspect of incision with fibrinous material in the base, edges still well approximated. Distal aspect of incision is well healed. No active or expressible drainage. No erythema seen.   MSK (spine):   -Strength exam                                                   Left                  Right   EHL                              5/5                  5/5 TA                                 5/5                  5/5 GSC                             5/5                  5/5 Knee extension            5/5                  5/5 Hip flexion                    5/5                  5/5   -Sensory exam                           Sensation intact to light touch in L3-S1  nerve distributions of bilateral lower extremities   Imaging: None obtained at today's visit     Patient name: Jonathan Strong Patient MRN: [id number] Date of visit: 07/20/23

## 2023-07-31 ENCOUNTER — Telehealth: Payer: Self-pay | Admitting: Orthopedic Surgery

## 2023-07-31 NOTE — Telephone Encounter (Signed)
 Patient called and said that he never got his back drained. He has a lot of fluid backed up and its causing pain. CB#[phone]

## 2023-08-03 ENCOUNTER — Telehealth: Payer: Self-pay | Admitting: Orthopedic Surgery

## 2023-08-03 ENCOUNTER — Telehealth: Payer: Self-pay

## 2023-08-03 NOTE — Telephone Encounter (Signed)
 Orthopedic Telephone Note  Called patient this evening.  He did not answer.  I left him a voicemail explaining I was trying to return his call.  I told him that I be in surgery tomorrow so I will retry him in the afternoon.  I also told him that if he gets this message within the next 45 minutes to an hour, I will probably still be at my desk and he should call me back at the same number.  As a note to staff, if he calls the office tomorrow, please take down a message and reiterate that I will call him as soon as I am out of surgery.  If he is having fever/chills, weakness, loss of bowel/bladder control, purulent drainage, or other concerning symptoms, please direct him to the ER.  Otherwise, as noted above I will plan to talk to him after surgery tomorrow since I was unable to get in touch with him tonight.   Jonathan DELENA Ada, MD Orthopedic Surgeon

## 2023-08-03 NOTE — Telephone Encounter (Signed)
 Patient called concerning back being drained.  Per Dr. Christell Constant. He will call patient to discuss.

## 2023-08-04 NOTE — Telephone Encounter (Signed)
 Put him on the schedule

## 2023-08-05 ENCOUNTER — Ambulatory Visit (INDEPENDENT_AMBULATORY_CARE_PROVIDER_SITE_OTHER): Payer: Commercial Managed Care - HMO | Admitting: Orthopedic Surgery

## 2023-08-05 DIAGNOSIS — R21 Rash and other nonspecific skin eruption: Secondary | ICD-10-CM

## 2023-08-05 MED ORDER — DOXYCYCLINE HYCLATE 100 MG PO TABS: 100.0000 mg | ORAL_TABLET | Freq: Two times a day (BID) | ORAL | 0 refills | Status: AC

## 2023-08-05 NOTE — Progress Notes (Signed)
 Orthopedic Surgery Post-operative Office Visit   Procedure: L3/4 laminectomy, L4/5 revision microdiscectomy Date of Surgery: 07/07/2023 (~4 weeks post-op)   Assessment: Patient is a 38 y.o. who has noticed a fullness in his back. Exam showed swelling in the area of his incision. No drainage. Patient had a hematoma     Plan: -Operative plans complete -His wound is no longer drainage, however, he has developed a fullness around his back and is worried about cosmesis. I went over his options over the phone. He wanted it aspirated in the office so he came in today for that which was done today. Encouraged compression around the area with his lumbar corset to avoid recollection -I told him he is not draining and his wound has improved, but his skin edges do look slightly erythematous so a prescription for doxycyline was sent in -Okay to let soap/water run over incision, but do not submerge -Pain management: OTC medications -Return to office in 2 weeks, lumbar x-rays needed at next visit: none   Hematoma aspiration: After discussing the risks, benefits, alternatives of hematoma aspiration, patient elected to proceed. The skin lateral to the incision and outside of the erythema was prepped with betadine . Ethyl chloride was used to anesthetize the skin. A 20 gauge needle was used to inject 3cc of lidocaine  into the skin. Then, a 18 gauge needle was inserted into the same area and used to aspirate the hematoma. Approximately 30cc of dark red blood was removed. No purulence or cloudy material was seen. Needle was withdrawn. Band aid was applied. Patient tolerated the procedure well.  ___________________________________________________________________________     Subjective: Patient's radiating leg pain is still gone. He has some numbness over the anterolateral thigh in the area of the LFCN. His back pain has been improving with time. He noticed some fullness in his back recently as the drainage stopped.  He is concerned about how this will look long term. I had talked to him over the phone and so he came in today to follow up on that conversation and possibly have a hematoma aspirated.    Objective:   General: no acute distress, appropriate affect Neurologic: alert, answering questions appropriately, following commands Respiratory: unlabored breathing on room air Skin: proximal aspect of incision (about 1cm) with fibrinous material in the base, edges still well approximated. Distal aspect of incision is well healed. No active or expressible drainage. Slight erythema seen around the skin edges distally. No other erythema seen.    MSK (spine):   -Strength exam                                                   Left                  Right   EHL                              5/5                  5/5 TA                                 5/5  5/5 GSC                             5/5                  5/5 Knee extension            5/5                  5/5 Hip flexion                    5/5                  5/5   -Sensory exam                           Sensation intact to light touch in L3-S1 nerve distributions of bilateral lower extremities   Imaging: None obtained at today's visit     Patient name: Jonathan Strong Patient MRN: [id number] Date of visit: 08/05/23

## 2023-08-19 ENCOUNTER — Ambulatory Visit (INDEPENDENT_AMBULATORY_CARE_PROVIDER_SITE_OTHER): Payer: Commercial Managed Care - HMO | Admitting: Orthopedic Surgery

## 2023-08-19 DIAGNOSIS — Z9889 Other specified postprocedural states: Secondary | ICD-10-CM

## 2023-08-19 NOTE — Progress Notes (Signed)
Orthopedic Surgery Post-operative Office Visit   Procedure: L3/4 laminectomy, L4/5 revision microdiscectomy Date of Surgery: 07/07/2023 (~6 weeks post-op)   Assessment: Patient is a 38 y.o. who is doing well after surgery     Plan: -Operative plans complete -No spine specific restrictions -Should continue with compression to prevent hematoma reduction -Pain management: OTC medications -Return to office 6 weeks, x-rays needed at next visit: AP/lateral/flex/ex lumbar   ___________________________________________________________________________     Subjective: Patient been doing well since he was last seen.  He is not having any radiating leg pain.  He has returned to work.  He said he works 7 hours yesterday and did not have any significant pain.  He still has increased sensitivity over the anterolateral aspect of his thighs.  Has not noticed any redness or drainage around his incision.   Objective:   General: no acute distress, appropriate affect Neurologic: alert, answering questions appropriately, following commands Respiratory: unlabored breathing on room air Skin: incision is well healed with no erythema, induration, active/expressible drainage   MSK (spine):   -Strength exam                                                   Left                  Right   EHL                              5/5                  5/5 TA                                 5/5                  5/5 GSC                             5/5                  5/5 Knee extension            5/5                  5/5 Hip flexion                    5/5                  5/5   -Sensory exam                           Sensation intact to light touch in L3-S1 nerve distributions of bilateral lower extremities   Imaging: None obtained at today's visit     Patient name: Jonathan Strong Patient MRN: [id number] Date of visit: 08/19/23

## 2023-09-02 ENCOUNTER — Other Ambulatory Visit: Payer: Self-pay | Admitting: Family Medicine

## 2023-09-02 DIAGNOSIS — F411 Generalized anxiety disorder: Secondary | ICD-10-CM

## 2023-09-30 ENCOUNTER — Other Ambulatory Visit (INDEPENDENT_AMBULATORY_CARE_PROVIDER_SITE_OTHER): Payer: Self-pay

## 2023-09-30 ENCOUNTER — Ambulatory Visit: Payer: Commercial Managed Care - HMO | Admitting: Orthopedic Surgery

## 2023-09-30 DIAGNOSIS — Z9889 Other specified postprocedural states: Secondary | ICD-10-CM | POA: Diagnosis not present

## 2023-09-30 DIAGNOSIS — L989 Disorder of the skin and subcutaneous tissue, unspecified: Secondary | ICD-10-CM

## 2023-09-30 NOTE — Progress Notes (Signed)
 Orthopedic Surgery Post-operative Office Visit   Procedure: L3/4 laminectomy, L4/5 revision microdiscectomy Date of Surgery: 07/07/2023 (~3 months post-op)   Assessment: Patient is a 38 y.o. who is doing well after surgery     Plan: -Operative plans complete -No spine specific restrictions -Patient has some cosmetic concerns about his forehead and wanted to see a Engineer, petroleum. Referral provided to him today -Pain management: OTC medications -Return to office 3 months, x-rays needed at next visit: AP/lateral/flex/ex lumbar   ___________________________________________________________________________     Subjective: Patient continues to do well after surgery. His pain has improved significantly since surgery. He still has a feeling like road rash over his anterolateral thigh. This has not changed since he was last seen. Overall, though, he is pleased with how he is doing. He has gotten back to gym and has started working out.    Objective:   General: no acute distress, appropriate affect Neurologic: alert, answering questions appropriately, following commands Respiratory: unlabored breathing on room air Skin: incision is well healed   MSK (spine):   -Strength exam                                                   Left                  Right   EHL                              5/5                  5/5 TA                                 5/5                  5/5 GSC                             5/5                  5/5 Knee extension            5/5                  5/5 Hip flexion                    5/5                  5/5   -Sensory exam                           Sensation intact to light touch in L3-S1 nerve distributions of bilateral lower extremities   Imaging: XRs of the lumbar spine from 09/30/2023 were independently reviewed and interpreted, showing disc height loss at elbow 3/4 and L4/5.  Laminectomy defect at L3/4 and left-sided hemilaminotomy defect seen at L4/5.   No fracture or dislocation seen.  No evidence of instability on flexion/extension views.     Patient name: Jonathan Strong Patient MRN: [id number] Date of visit: 09/30/23

## 2023-10-02 ENCOUNTER — Encounter: Payer: Self-pay | Admitting: Family Medicine

## 2023-10-02 ENCOUNTER — Ambulatory Visit: Admitting: Family Medicine

## 2023-10-02 VITALS — BP 110/70 | HR 74 | Ht 73.0 in | Wt 228.5 lb

## 2023-10-02 DIAGNOSIS — L578 Other skin changes due to chronic exposure to nonionizing radiation: Secondary | ICD-10-CM | POA: Diagnosis not present

## 2023-10-02 DIAGNOSIS — G8929 Other chronic pain: Secondary | ICD-10-CM

## 2023-10-02 DIAGNOSIS — M545 Low back pain, unspecified: Secondary | ICD-10-CM | POA: Diagnosis not present

## 2023-10-02 DIAGNOSIS — F411 Generalized anxiety disorder: Secondary | ICD-10-CM

## 2023-10-02 MED ORDER — BACLOFEN 10 MG PO TABS: 10.0000 mg | ORAL_TABLET | Freq: Three times a day (TID) | ORAL | 0 refills | Status: DC | PRN

## 2023-10-02 MED ORDER — CLONAZEPAM 1 MG PO TABS: 1.0000 mg | ORAL_TABLET | Freq: Three times a day (TID) | ORAL | 1 refills | Status: DC | PRN

## 2023-10-02 NOTE — Assessment & Plan Note (Addendum)
 Overall symptoms are stable however he is interested in switching his Valium to clonazepam.  We did discuss potential side effects and the fact that they have similar side effect profile due to being in the same class.  He will continue with Celexa 30 mg daily.  We will switch Valium to clonazepam 1 mg 3 times daily as needed as well as below.  He will follow-up in a few weeks to MyChart we can adjust as needed.

## 2023-10-02 NOTE — Assessment & Plan Note (Signed)
Will refer to dermatology

## 2023-10-02 NOTE — Progress Notes (Signed)
   Jonathan Strong is a 38 y.o. male who presents today for an office visit.  Assessment/Plan:  Chronic Problems Addressed Today: GAD (generalized anxiety disorder) Overall symptoms are stable however he is interested in switching his Valium to clonazepam.  We did discuss potential side effects and the fact that they have similar side effect profile due to being in the same class.  He will continue with Celexa 30 mg daily.  We will switch Valium to clonazepam 1 mg 3 times daily as needed as well as below.  He will follow-up in a few weeks to MyChart we can adjust as needed.  Chronic back pain, Low Back Pain Overall symptoms are stable though still does have occasional spasms in his back.  Previously been taking Valium for this but would like to try alternative medication due to concern for long-term side effects including memory loss and Alzheimer's.  We did discuss that clonazepam is in the same class as Valium and has similar side effect profile.  We will try baclofen as he has not done this before.  Will also be switching Valium to clonazepam to see if he tolerates this better.  He will follow-up with [place] in a week or 2 via MyChart.  We can adjust meds as needed.  Could consider trial of Robaxin or Zanaflex if he does not do well with baclofen.  Will defer further management to orthopedics.  Sun-damaged skin Will refer to dermatology    Subjective:  HPI:  See Assessment / plan for status of chronic conditions.  Patient is here today to discuss Valium.  Has been taking this as needed for spasms in his back for quite a while.  Also occasionally uses as needed's for anxiety.  He is interested in switching to clonazepam as he is concerned about potential side effects of the Valium.  The Valium does work however makes him feel more drowsy than he would like.  He is also concerned about long-term side effects including cognitive decline and Alzheimer's.  He has been consistent with his Celexa.  He  has tried other medications for spasms in his back including Flexeril which were not effective.       Objective:  Physical Exam: BP 110/70 (BP Location: Left Arm, Patient Position: Sitting, Cuff Size: Large)   Pulse 74   Ht 6\' 1"  (1.854 m)   Wt 228 lb 8 oz (103.6 kg)   SpO2 96%   BMI 30.15 kg/m   Gen: No acute distress, resting comfortably CV: Regular rate and rhythm with no murmurs appreciated Pulm: Normal work of breathing, clear to auscultation bilaterally with no crackles, wheezes, or rhonchi Neuro: Grossly normal, moves all extremities Psych: Normal affect and thought content      Hayleigh Bawa M. [name], MD 10/02/2023 9:31 AM

## 2023-10-02 NOTE — Patient Instructions (Signed)
 It was very nice to see you today!  Please try the baclofen for your spasms and back pain.  We will switch her Valium to clonazepam.  For me know in a few weeks how this is working for you.  Return if symptoms worsen or fail to improve.   Take care, Dr [name]  PLEASE NOTE:  If you had any lab tests, please let us know if you have not heard back within a few days. You may see your results on mychart before we have a chance to review them but we will give you a call once they are reviewed by [place].   If we ordered any referrals today, please let us know if you have not heard from their office within the next week.   If you had any urgent prescriptions sent in today, please check with the pharmacy within an hour of our visit to make sure the prescription was transmitted appropriately.   Please try these tips to maintain a healthy lifestyle:  Eat at least 3 REAL meals and 1-2 snacks per day.  Aim for no more than 5 hours between eating.  If you eat breakfast, please do so within one hour of getting up.   Each meal should contain half fruits/vegetables, one quarter protein, and one quarter carbs (no bigger than a computer mouse)  Cut down on sweet beverages. This includes juice, soda, and sweet tea.   Drink at least 1 glass of water with each meal and aim for at least 8 glasses per day  Exercise at least 150 minutes every week.

## 2023-10-02 NOTE — Assessment & Plan Note (Addendum)
 Overall symptoms are stable though still does have occasional spasms in his back.  Previously been taking Valium for this but would like to try alternative medication due to concern for long-term side effects including memory loss and Alzheimer's.  We did discuss that clonazepam is in the same class as Valium and has similar side effect profile.  We will try baclofen as he has not done this before.  Will also be switching Valium to clonazepam to see if he tolerates this better.  He will follow-up with [place] in a week or 2 via MyChart.  We can adjust meds as needed.  Could consider trial of Robaxin or Zanaflex if he does not do well with baclofen.  Will defer further management to orthopedics.

## 2023-10-20 ENCOUNTER — Encounter: Payer: Self-pay | Admitting: Family Medicine

## 2023-10-20 DIAGNOSIS — M545 Low back pain, unspecified: Secondary | ICD-10-CM

## 2023-10-20 NOTE — Telephone Encounter (Signed)
**Note De-identified  [place] Obfuscation** Please advise 

## 2023-10-22 ENCOUNTER — Other Ambulatory Visit: Payer: Self-pay | Admitting: Family Medicine

## 2023-10-22 DIAGNOSIS — M545 Low back pain, unspecified: Secondary | ICD-10-CM

## 2023-10-22 MED ORDER — DIAZEPAM 10 MG PO TABS: 10.0000 mg | ORAL_TABLET | Freq: Three times a day (TID) | ORAL | 5 refills | Status: DC | PRN

## 2023-10-22 NOTE — Telephone Encounter (Signed)
 Copied from CRM [id number]. Topic: Clinical - Medication Refill >> Oct 22, 2023 12:15 PM Eunice Blase wrote: Most Recent Primary Care Visit:  Provider: [name]  Department: [facility]  Visit Type: OFFICE VISIT  Date: 10/02/2023  Medication: diazepam (VALIUM) 10 MG tablet  Has the patient contacted their pharmacy? Yes (Agent: If no, request that the patient contact the pharmacy for the refill. If patient does not wish to contact the pharmacy document the reason why and proceed with request.) (Agent: If yes, when and what did the pharmacy advise?)Pharmacy need PCP approval.   Is this the correct pharmacy for this prescription? Yes If no, delete pharmacy and type the correct one.  This is the patient's preferred pharmacy:  [id number] [place], [place] - [address] AT [address]NER OF [facility] [address] [place] [address] Phone: [phone] Fax: [phone]   Has the prescription been filled recently? Yes  Is the patient out of the medication? Yes  Has the patient been seen for an appointment in the last year OR does the patient have an upcoming appointment? Yes  Can we respond through MyChart? Yes  Agent: Please be advised that Rx refills may take up to 3 business days. We ask that you follow-up with your pharmacy.

## 2023-10-23 ENCOUNTER — Telehealth: Payer: Self-pay | Admitting: *Deleted

## 2023-10-23 NOTE — Telephone Encounter (Signed)
 Copied from CRM [id number]. Topic: Clinical - Prescription Issue >> Oct 22, 2023 12:12 PM Eunice Blase wrote: Reason for CRM: Pt called stated clonazePAM (KLONOPIN) 1 MG tablet is not working would like to go back to diazepam (VALIUM) 10 MG tablet. Please call pt at [phone]   [facility]

## 2023-10-28 ENCOUNTER — Encounter: Payer: Self-pay | Admitting: Plastic Surgery

## 2023-10-28 ENCOUNTER — Ambulatory Visit: Admitting: Plastic Surgery

## 2023-10-28 VITALS — BP 118/80 | HR 94 | Ht 73.0 in | Wt 226.8 lb

## 2023-10-28 DIAGNOSIS — L989 Disorder of the skin and subcutaneous tissue, unspecified: Secondary | ICD-10-CM

## 2023-10-28 NOTE — Progress Notes (Signed)
 Referring Provider [name], MD [address] [place],  [place] [address]   CC:  Chief Complaint  Patient presents with   Advice Only      Jonathan Strong is an 38 y.o. male.  HPI: [name] is a 38 year old male who presents today for evaluation of 2 areas on his face.  The first and most concerning to him is an area approximately 4 x 2 cm just above the glabella.  The patient underwent laminectomy in December of last year and apparently developed a pressure wound while he was in the prone position.  The initial swelling has decreased but he has a very faint area of discoloration in the area of the initial injury.  He feels that there is a depression in the area but I am unable to feel that today.  Additionally he has a 2 mm skin lesion on his forehead which has been there for several years per his history.  Allergies  Allergen Reactions   Gabapentin Other (See Comments)    Became suicidal   Hydrocodone Nausea And Vomiting   Oxycodone Nausea And Vomiting    Outpatient Encounter Medications as of 10/28/2023  Medication Sig   baclofen (LIORESAL) 10 MG tablet Take 1 tablet (10 mg total) by mouth 3 (three) times daily as needed for muscle spasms.   citalopram (CELEXA) 20 MG tablet TAKE 1 AND 1/2 TABLETS BY MOUTH DAILY   diazepam (VALIUM) 10 MG tablet Take 1 tablet (10 mg total) by mouth every 8 (eight) hours as needed.   OVER THE COUNTER MEDICATION Take 2 capsules by mouth daily. Kiki green mushroom supplement   OVER THE COUNTER MEDICATION Take 2 capsules by mouth 2 (two) times daily. Magtein supplement   OVER THE COUNTER MEDICATION Take 2 capsules by mouth every other day. Alpha brain supplement   No facility-administered encounter medications on file as of 10/28/2023.     Past Medical History:  Diagnosis Date   Depression    History of chicken pox    History of mononucleosis    treated at [facility] 3 weeks ago per patient   Hyperlipidemia     Past Surgical History:   Procedure Laterality Date   DECOMPRESSIVE LUMBAR LAMINECTOMY LEVEL 1 N/A 07/07/2023   Procedure: L3-4 DECOMPRESSIVE LUMBAR LAMINECTOMY, L4-5 REVISION DECOMPRESSION AND DISCECTOMY;  Surgeon: London Sheer, MD;  Location: [facility] OR;  Service: Orthopedics;  Laterality: N/A;   LUMBAR LAMINECTOMY N/A 05/01/2023   Procedure: Lumbar four-five MICRODISCECTOMY LUMBAR;  Surgeon: London Sheer, MD;  Location: [facility] OR;  Service: Orthopedics;  Laterality: N/A;   WISDOM TOOTH EXTRACTION      Family History  Problem Relation Age of Onset   Mental illness Brother    Bipolar disorder Brother    Schizophrenia Father    Rectal cancer Neg Hx    Stomach cancer Neg Hx     Social History   Social History Narrative   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  He also feels that there is an area of slight depression at the area of discoloration.  He also has a skin lesion that has been present for several years.  Physical Exam    10/28/2023    9:19 AM 10/02/2023    8:57 AM 07/10/2023    3:26 PM  Vitals with BMI  Height 6\' 1"  6\' 1"  6\' 1"   Weight 226 lbs 13 oz 228 lbs 8 oz 224 lbs  BMI 29.93 30.15 29.56  Systolic 118 110   Diastolic 80 70   Pulse 94 74     General:  No acute distress,  Alert and oriented, Non-Toxic, Normal speech and affect Integument: Area of discoloration above the glabella.  I do not feel any depression in this area.  The skin lesion is 2 mm as noted.  It is unclear whether this is benign or not.   Assessment/Plan Skin lesion:  We discussed the area of discoloration from the pressure injury.  I do not believe that there is anything that I can do with this if I try to excise it I will raise his eyebrows and a very unnatural appearance and will leave him with a scar.  I do not feel the depression do not believe that I can improve that aspect of the wound either.  I have strongly encouraged him just to watch it for at least a year.  I have also strongly encouraged him to use sunblock at all times when he is outside.  The 2 mm lesion can be readily excised in the office under local.  He would like to schedule this.  Will remove it for pathologic diagnosis.  He understands there will be a scar.  He understands that there will be sutures of will need to be removed 5 to 7 days after surgery.  Photographs were obtained today with his consent.  [name] 10/28/2023, 9:31 AM

## 2023-11-17 ENCOUNTER — Other Ambulatory Visit: Payer: Self-pay | Admitting: Family Medicine

## 2023-11-17 DIAGNOSIS — M545 Low back pain, unspecified: Secondary | ICD-10-CM

## 2023-11-17 NOTE — Telephone Encounter (Signed)
 Copied from CRM [id number]. Topic: Clinical - Medication Refill >> Nov 17, 2023  3:52 PM Dimple Francis wrote: Most Recent Primary Care Visit:  Provider: Rodney Clamp  Department: [facility]  Visit Type: OFFICE VISIT  Date: 10/02/2023  Medication: diazepam  (VALIUM ) 10 MG tablet  Has the patient contacted their pharmacy? Yes (Agent: If no, request that the patient contact the pharmacy for the refill. If patient does not wish to contact the pharmacy document the reason why and proceed with request.) (Agent: If yes, when and what did the pharmacy advise?)  Is this the correct pharmacy for this prescription? Yes If no, delete pharmacy and type the correct one.  This is the patient's preferred pharmacy:  [id number] [place], [place] - [address] AT [address] OF [facility] [address] [place] [address] Phone: [phone] Fax: [phone]   Has the prescription been filled recently? Yes  Is the patient out of the medication? Yes  Has the patient been seen for an appointment in the last year OR does the patient have an upcoming appointment? Yes  Can we respond through MyChart? Yes  Agent: Please be advised that Rx refills may take up to 3 business days. We ask that you follow-up with your pharmacy.

## 2023-11-18 MED ORDER — DIAZEPAM 10 MG PO TABS: 10.0000 mg | ORAL_TABLET | Freq: Three times a day (TID) | ORAL | 5 refills | Status: DC | PRN

## 2023-11-23 ENCOUNTER — Telehealth: Payer: Self-pay | Admitting: *Deleted

## 2023-11-23 NOTE — Telephone Encounter (Signed)
 Jonathan Strong

## 2023-11-23 NOTE — Telephone Encounter (Signed)
 Prescription was sent in last week.  Jinny Mounts. Daneil Dunker, MD 11/23/2023 7:59 AM

## 2023-11-23 NOTE — Telephone Encounter (Signed)
 Copied from CRM [id number]. Topic: Clinical - Medication Refill >> Nov 20, 2023  4:38 PM Jonathan Strong wrote: Patient called to check status of this request. States he has to leave to go out of town Monday and needs to have the medication before then. Per chart let him know shows sent 4/23. He says its not showing as listed. Wants to see if someone can reach out to pharmacy to confirm. Thank You     [facility]

## 2023-11-26 ENCOUNTER — Encounter: Payer: Self-pay | Admitting: Plastic Surgery

## 2023-11-26 ENCOUNTER — Ambulatory Visit: Admitting: Plastic Surgery

## 2023-11-26 ENCOUNTER — Telehealth: Payer: Self-pay | Admitting: *Deleted

## 2023-11-26 VITALS — BP 154/103 | HR 107

## 2023-11-26 DIAGNOSIS — D1801 Hemangioma of skin and subcutaneous tissue: Secondary | ICD-10-CM

## 2023-11-26 DIAGNOSIS — D489 Neoplasm of uncertain behavior, unspecified: Secondary | ICD-10-CM

## 2023-11-26 DIAGNOSIS — L989 Disorder of the skin and subcutaneous tissue, unspecified: Secondary | ICD-10-CM

## 2023-11-26 NOTE — Progress Notes (Signed)
 Procedure Note  Preoperative Dx: Forehead skin lesion  Postoperative Dx: Same  Procedure: Excision of forehead skin lesion  Anesthesia: Lidocaine  1% with 1:100,000 epinephrine  and 0.25% Sensorcaine    Indication for Procedure: Removal for pathologic diagnosis  Description of Procedure: Risks and complications were explained to the patient including recurrence and the need for additional procedures based on pathology.  Consent was confirmed and the patient understands the risks and benefits.  The potential complications and alternatives were explained and the patient consents.  The patient expressed understanding the option of not having the procedure and the risks of a scar.  Time out was called and all information was confirmed to be correct.    The area was prepped and drapped.  Local anesthetic was injected in the subcutaneous tissues.  After waiting for the local to take affect an elliptical incision was made around the 3 mm skin lesion.  After obtaining hemostasis, the surgical wound was closed with interrupted 5-0 Prolene sutures.  The surgical wound measured 5 mm.  A dressing was applied.  The patient was given instructions on how to care for the area and a follow up appointment.  Jonathan Strong tolerated the procedure well and there were no complications. The specimen was sent to pathology.

## 2023-11-26 NOTE — Telephone Encounter (Signed)
 Copied from CRM [id number]. Topic: Clinical - Medical Advice >> Nov 26, 2023 10:08 AM [place] A wrote: Reason for CRM: [facility] informed patient to let his primary know that his Blood Pressure was high today   BP 154/103 at surgeon office today  Patient asymptomatic  Patient was advise to monitor BP  BP should be less then 140/90 Let us  know if PB continue elevated  Jonathan Strong,RMA

## 2023-11-27 ENCOUNTER — Telehealth: Payer: Self-pay | Admitting: *Deleted

## 2023-11-27 NOTE — Telephone Encounter (Signed)
 Spoke to pt asked him what his blood pressure was? Pt said yesterday at Plastic surgery it was 154/103. Asked pt if he is able to check his blood pressure? Pt said he checked his neighbor is not home. Asked pt if having any headaches, dizziness, blurred vision? Pt said no, to much alcohol the night before visit per pt. Told him to monitor blood pressure over the weekend especially if he has any symptoms and try to decrease alcohol and if elevated schedule an appointment next week with Dr. Daneil Dunker. Pt verbalized understanding.

## 2023-11-27 NOTE — Telephone Encounter (Signed)
 Copied from CRM [id number]. Topic: Clinical - Medical Advice >> Nov 26, 2023 10:08 AM [place] A wrote: Reason for CRM: [facility] informed patient to let his primary know that his Blood Pressure was high today

## 2023-11-28 ENCOUNTER — Emergency Department (HOSPITAL_BASED_OUTPATIENT_CLINIC_OR_DEPARTMENT_OTHER)

## 2023-11-28 ENCOUNTER — Encounter (HOSPITAL_BASED_OUTPATIENT_CLINIC_OR_DEPARTMENT_OTHER): Payer: Self-pay | Admitting: Emergency Medicine

## 2023-11-28 ENCOUNTER — Emergency Department (HOSPITAL_BASED_OUTPATIENT_CLINIC_OR_DEPARTMENT_OTHER)
Admission: EM | Admit: 2023-11-28 | Discharge: 2023-11-28 | Disposition: A | Discharge status: Home / Self Care | Attending: Emergency Medicine | Admitting: Emergency Medicine

## 2023-11-28 ENCOUNTER — Other Ambulatory Visit: Payer: Self-pay

## 2023-11-28 DIAGNOSIS — T18128A Food in esophagus causing other injury, initial encounter: Secondary | ICD-10-CM | POA: Diagnosis present

## 2023-11-28 DIAGNOSIS — W44F3XA Food entering into or through a natural orifice, initial encounter: Secondary | ICD-10-CM | POA: Diagnosis not present

## 2023-11-28 DIAGNOSIS — H938X3 Other specified disorders of ear, bilateral: Secondary | ICD-10-CM | POA: Diagnosis not present

## 2023-11-28 DIAGNOSIS — R070 Pain in throat: Secondary | ICD-10-CM | POA: Insufficient documentation

## 2023-11-28 DIAGNOSIS — Z87891 Personal history of nicotine dependence: Secondary | ICD-10-CM | POA: Diagnosis not present

## 2023-11-28 MED ORDER — ALUM & MAG HYDROXIDE-SIMETH 200-200-20 MG/5ML PO SUSP
30.0000 mL | Freq: Once | ORAL | Status: AC
  Administered 2023-11-28: 30 mL via ORAL
  Filled 2023-11-28: qty 30

## 2023-11-28 MED ORDER — MAALOX MAX 400-400-40 MG/5ML PO SUSP: 15.0000 mL | Freq: Four times a day (QID) | ORAL | 0 refills | Status: DC | PRN

## 2023-11-28 MED ORDER — CETIRIZINE-PSEUDOEPHEDRINE ER 5-120 MG PO TB12: 1.0000 | ORAL_TABLET | Freq: Every day | ORAL | 0 refills | Status: DC | PRN

## 2023-11-28 MED ORDER — LIDOCAINE VISCOUS HCL 2 % MT SOLN
15.0000 mL | Freq: Once | OROMUCOSAL | Status: AC
  Administered 2023-11-28: 15 mL via ORAL
  Filled 2023-11-28: qty 15

## 2023-11-28 NOTE — ED Triage Notes (Signed)
 Pt arrives stating food got lodged inthroat last night. Was able to cough it up this am finally, but has extremely sore throat and all way up to his right temple . Pt able to swallow water in triage.

## 2023-11-28 NOTE — ED Provider Notes (Signed)
 [place] EMERGENCY DEPARTMENT AT [facility] Provider Note   CSN: [id number] Arrival date & time: 11/28/23  0935     History  No chief complaint on file.   Jonathan Strong is a 38 y.o. male.  HPI   38 year old male presents emergency department.  States that last night, was eating a "spicy sandwich."  States that a piece of the sandwich got caught in the back of his throat.  States that he felt like it was later overnight before he is able to cough it up this morning.  Reports having sore throat.  States that as he was coughing up the piece of food, developed pressure in his ears with some pressure in his right face.  Also reports having blood pressure elevated at 2 office visits this month.  Does not take his blood pressure regularly.  Denies any fevers, chills, chest pain, shortness of breath, abdominal pain, nausea, vomiting.  Has been able to tolerate p.o.'s and symptom onset but with some discomfort in the back of his throat.  Past medical history significant for hyperlipidemia, degenerative disc disease, GAD, lumbar disc herniation with radiculopathy  Home Medications Prior to Admission medications   Medication Sig Start Date End Date Taking? Authorizing Provider  alum & mag hydroxide-simeth (MAALOX MAX) 400-400-40 MG/5ML suspension Take 15 mLs by mouth every 6 (six) hours as needed for indigestion. 11/28/23  Yes Neil Balls A, PA  cetirizine-pseudoephedrine  (ZYRTEC-D) 5-120 MG tablet Take 1 tablet by mouth daily as needed for allergies or rhinitis. 11/28/23  Yes Neil Balls A, PA  baclofen  (LIORESAL ) 10 MG tablet Take 1 tablet (10 mg total) by mouth 3 (three) times daily as needed for muscle spasms. 10/02/23   Rodney Clamp, MD  citalopram  (CELEXA ) 20 MG tablet TAKE 1 AND 1/2 TABLETS BY MOUTH DAILY 09/02/23   Rodney Clamp, MD  diazepam  (VALIUM ) 10 MG tablet Take 1 tablet (10 mg total) by mouth every 8 (eight) hours as needed. 11/18/23   Rodney Clamp, MD  OVER  THE COUNTER MEDICATION Take 2 capsules by mouth daily. Kiki green mushroom supplement    [provider]  OVER THE COUNTER MEDICATION Take 2 capsules by mouth 2 (two) times daily. Magtein supplement    [provider]  OVER THE COUNTER MEDICATION Take 2 capsules by mouth every other day. Alpha brain supplement    [provider]      Allergies    Gabapentin , Hydrocodone, and Oxycodone     Review of Systems   Review of Systems  All other systems reviewed and are negative.   Physical Exam Updated Vital Signs BP (!) 141/88   Pulse 61   Temp 97.9 F (36.6 C) (Oral)   Resp 20   Wt 104.3 kg   SpO2 98%   BMI 30.34 kg/m  Physical Exam Vitals and nursing note reviewed.  Constitutional:      General: He is not in acute distress.    Appearance: He is well-developed.  HENT:     Head: Normocephalic and atraumatic.     Comments: Right maxillary sinus tenderness to palpation.    Ears:     Comments: Serous fluid level appreciated behind bilateral TM. Eyes:     Conjunctiva/sclera: Conjunctivae normal.  Cardiovascular:     Rate and Rhythm: Normal rate and regular rhythm.     Heart sounds: No murmur heard. Pulmonary:     Effort: Pulmonary effort is normal. No respiratory distress.  Breath sounds: Normal breath sounds.  Abdominal:     Palpations: Abdomen is soft.     Tenderness: There is no abdominal tenderness.  Musculoskeletal:        General: No swelling.     Cervical back: Neck supple.  Skin:    General: Skin is warm and dry.     Capillary Refill: Capillary refill takes less than 2 seconds.  Neurological:     Mental Status: He is alert.  Psychiatric:        Mood and Affect: Mood normal.     ED Results / Procedures / Treatments   Labs (all labs ordered are listed, but only abnormal results are displayed) Labs Reviewed - No data to display  EKG None  Radiology DG Chest [facility] 1 View Result Date: 11/28/2023 CLINICAL DATA:  Chest pain and  cough.  Dysphagia. EXAM: PORTABLE CHEST 1 VIEW COMPARISON:  01/20/2006 FINDINGS: The heart size and mediastinal contours are within normal limits. Both lungs are clear. The visualized skeletal structures are unremarkable. IMPRESSION: No active disease. Electronically Signed   By: Marlyce Sine M.D.   On: 11/28/2023 10:45    Procedures Procedures    Medications Ordered in ED Medications  alum & mag hydroxide-simeth (MAALOX/MYLANTA) 200-200-20 MG/5ML suspension 30 mL (30 mLs Oral Given 11/28/23 1005)    And  lidocaine  (XYLOCAINE ) 2 % viscous mouth solution 15 mL (15 mLs Oral Given 11/28/23 1005)    ED Course/ Medical Decision Making/ A&P                                 Medical Decision Making Amount and/or Complexity of Data Reviewed Radiology: ordered.  Risk OTC drugs. Prescription drug management.   This patient presents to the ED for concern of sore throat, ear pressure, this involves an extensive number of treatment options, and is a complaint that carries with it a high risk of complications and morbidity.  The differential diagnosis includes viral URI, COVID, flu, RSV, otitis media, otitis externa, perforated esophagus, esophagitis, other   Co morbidities that complicate the patient evaluation  See HPI   Additional history obtained:  Additional history obtained from EMR External records from outside source obtained and reviewed including hospital records   Lab Tests:  N/a   Imaging Studies ordered:  I ordered imaging studies including chest x-ray I independently visualized and interpreted imaging which showed no acute abnormality I agree with the radiologist interpretation   Cardiac Monitoring: / EKG:  N/a   Consultations Obtained:  N/a   Problem List / ED Course / Critical interventions / Medication management  Food bolus, throat pain, ear pressure I ordered medication including lidocaine , Maalox   Reevaluation of the patient after these medicines  showed that the patient improved I have reviewed the patients home medicines and have made adjustments as needed   Social Determinants of Health:  Former cigarette use.  Denies illicit drug use.   Test / Admission - Considered:  Food bolus, throat pain, ear pressure Vitals signs significant for hypertension blood pressure 136/90. Otherwise within normal range and stable throughout visit. Imaging studies significant for: See above 38 year old male presents emergency department.  States that last night, was eating a "spicy sandwich."  States that a piece of the sandwich got caught in the back of his throat.  States that he felt like it was later overnight before he is able to cough it up this morning.  Reports having sore throat.  States that as he was coughing up the piece of food, developed pressure in his ears with some pressure in his right face.  Also reports having blood pressure elevated at 2 office visits this month.  Does not take his blood pressure regularly.  Denies any fevers, chills, chest pain, shortness of breath, abdominal pain, nausea, vomiting.  Has been able to tolerate p.o.'s and symptom onset but with some discomfort in the back of his throat. On exam, no auscultatory stridor, nontender abdomen.  Lungs clear to auscultation bilaterally.  Patient was with serous fluid level appreciated bilateral TM with some right maxillary sinus tenderness to palpation.  Oropharyngeal exam unremarkable.  Given patient's history, some concern for possible localized esophagitis from food bolus impaction.  Chest x-ray was performed which not show evidence of esophageal perforation.  Treated with Maalox/lidocaine  solution and did note significant improvement of throat symptoms with subsequent tolerance of p.o.  Will trial Maalox as needed, recommend dietary changes and give patient follow-up information for GI specialist.  Regarding blood pressure, will recommend monitoring blood pressure as it is only  mildly elevated today and recommend further lifestyle/dietary modifications.  Treatment plan discussed with patient and he denies understanding was agreeable to said plan.  Patient will well-appearing and afebrile in no acute distress. Worrisome signs and symptoms were discussed with the patient, and the patient acknowledged understanding to return to the ED if noticed. Patient was stable upon discharge.          Final Clinical Impression(s) / ED Diagnoses Final diagnoses:  Food impaction of esophagus, initial encounter  Throat pain  Ear pressure, bilateral    Rx / DC Orders ED Discharge Orders     None         Parker Butter, [place] 11/28/23 1115    Lowery Rue, DO 11/28/23 1450

## 2023-11-28 NOTE — Discharge Instructions (Addendum)
 As discussed, your workup today was overall reassuring.  Chest x-ray appeared normal.  We will place you on medicine to help with your sore throat until symptoms improved.  Will attach number for GI specialist to follow-up with if the symptoms become recurrent.  Will also place you on allergy medicine with decongestion to help with your additional symptoms.  Please do not hesitate to return if the worrisome signs and symptoms we discussed become apparent.

## 2023-11-28 NOTE — ED Notes (Signed)
 AVS given.Health teaching done.questions answered satisfactorily.Discharged ambulatory.

## 2023-12-02 ENCOUNTER — Ambulatory Visit (INDEPENDENT_AMBULATORY_CARE_PROVIDER_SITE_OTHER): Admitting: Family

## 2023-12-02 ENCOUNTER — Encounter: Payer: Self-pay | Admitting: Family

## 2023-12-02 ENCOUNTER — Telehealth: Payer: Self-pay

## 2023-12-02 VITALS — BP 129/81 | HR 107 | Temp 98.1°F | Ht 73.0 in | Wt 229.6 lb

## 2023-12-02 DIAGNOSIS — S9030XA Contusion of unspecified foot, initial encounter: Secondary | ICD-10-CM

## 2023-12-02 DIAGNOSIS — M7660 Achilles tendinitis, unspecified leg: Secondary | ICD-10-CM

## 2023-12-02 MED ORDER — CYCLOBENZAPRINE HCL 10 MG PO TABS: 10.0000 mg | ORAL_TABLET | Freq: Every evening | ORAL | 0 refills | Status: DC | PRN

## 2023-12-02 NOTE — Telephone Encounter (Signed)
 Spoke with patient stated already took care of issues

## 2023-12-02 NOTE — Progress Notes (Signed)
 Patient ID: Jonathan Strong, male    DOB: October 25, 1985, 38 y.o.   MRN: [id number]  Chief Complaint  Patient presents with   Foot Pain    Pt c/o bilateral heel pain after playing in game on Monday. Sx present since this morning.  Discussed the use of AI scribe software for clinical note transcription with the patient, who gave verbal consent to proceed.  History of Present Illness Jonathan Strong "Jonathan Strong" is a 38 year old male who presents with severe bilateral heel pain after playing soccer.  He experiences severe, constant pain in both heels, described as the worst pain he has ever had, after playing soccer for about thirty minutes. The pain began yesterday at 5 AM and was so intense morning that he could not get out of bed. It is located on the bottom, around both sides and, back of both heels and radiating slightly up the bones, with some swelling. The pain is exacerbated by pressure and body weight but persists even when feet are elevated. Over-the-counter medications, including Tylenol  1g and  ibuprofen  1g did not alleviate the pain.  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 He usually uses insoles with arch support and anti-fatigue mats but currently prefers to walk barefoot. He has no previous similar episodes of heel pain.  Assessment & Plan Heel contusion/achilles tendinopathy - Acute bilateral heel pain post-soccer game, severe, constant, exacerbated by weight-bearing. Differential includes bone bruise, heel pad atrophy, achilles tendinopathy and plantar fasciitis. X-ray offered, but pt feels unnecessary at this time. Emphasized supportive footwear and insoles. Recommended icing and NSAIDs for pain management. Discussed potential podiatry referral if symptoms persist. - Advise icing heels with a frozen water bottle for 10-15 minutes several times a day. - Recommend wearing supportive shoes with  appropriate insoles. - Suggest temporarily using thick gel heel cushions on top of current insoles for additional support while healing. - Sending generic Flexeril 10mg  qhs to help with sleep r/t pain. - Ok to take ibuprofen  800 mg tid for 3 days only after eating to reduce inflammation and pain. - Ok to add Tylenol  if needed. - Consider x-ray if pain does not improve by Friday. - Discuss potential podiatry referral if symptoms persist or recur.   Subjective:    Outpatient Medications Prior to Visit  Medication Sig Dispense Refill   alum & mag hydroxide-simeth (MAALOX MAX) 400-400-40 MG/5ML suspension Take 15 mLs by mouth every 6 (six) hours as needed for indigestion. 355 mL 0   cetirizine-pseudoephedrine  (ZYRTEC-D) 5-120 MG tablet Take 1 tablet by mouth daily as needed for allergies or rhinitis. 30 tablet 0   citalopram  (CELEXA ) 20 MG tablet TAKE 1 AND 1/2 TABLETS BY MOUTH DAILY 45 tablet 2   diazepam  (VALIUM ) 10 MG tablet Take 1 tablet (10 mg total) by mouth every 8 (eight) hours as needed. 30 tablet 5   OVER THE COUNTER MEDICATION Take 2 capsules by mouth daily. Kiki green mushroom supplement     OVER THE COUNTER MEDICATION Take 2 capsules by mouth 2 (two) times daily. Magtein supplement     OVER THE COUNTER MEDICATION Take 2 capsules by mouth every other day. Alpha brain supplement     baclofen  (LIORESAL ) 10 MG tablet Take 1 tablet (10 mg total) by mouth 3 (three) times daily as needed for muscle spasms. (Patient not taking: Reported on 12/02/2023) 30 each 0   No  facility-administered medications prior to visit.   Past Medical History:  Diagnosis Date   Depression    History of chicken pox    History of mononucleosis    treated at [facility] ER 3 weeks ago per patient   Hyperlipidemia    Past Surgical History:  Procedure Laterality Date   DECOMPRESSIVE LUMBAR LAMINECTOMY LEVEL 1 N/A 07/07/2023   Procedure: L3-4 DECOMPRESSIVE LUMBAR LAMINECTOMY, L4-5 REVISION DECOMPRESSION AND  DISCECTOMY;  Surgeon: Diedra Fowler, MD;  Location: [facility] OR;  Service: Orthopedics;  Laterality: N/A;   LUMBAR LAMINECTOMY N/A 05/01/2023   Procedure: Lumbar four-five MICRODISCECTOMY LUMBAR;  Surgeon: Diedra Fowler, MD;  Location: [facility] OR;  Service: Orthopedics;  Laterality: N/A;   WISDOM TOOTH EXTRACTION     Allergies  Allergen Reactions   Gabapentin  Other (See Comments)    Became suicidal   Hydrocodone Nausea And Vomiting   Oxycodone  Nausea And Vomiting      Objective:    Physical Exam Vitals and nursing note reviewed.  Constitutional:      General: He is not in acute distress.    Appearance: Normal appearance.  HENT:     Head: Normocephalic.  Cardiovascular:     Rate and Rhythm: Normal rate and regular rhythm.  Pulmonary:     Effort: Pulmonary effort is normal.     Breath sounds: Normal breath sounds.  Musculoskeletal:        General: Normal range of motion.     Cervical back: Normal range of motion.  Feet:     Right foot:     Skin integrity: No erythema or warmth.     Left foot:     Skin integrity: No erythema or warmth.     Comments: Pain with palpation on lateral & medial sides, plantar & posterior of both heels. Mild swelling noted on plantar of left heel. No erythema or bruising noted. Skin:    General: Skin is warm and dry.  Neurological:     Mental Status: He is alert and oriented to person, place, and time.  Psychiatric:        Mood and Affect: Mood normal.    BP 129/81 (BP Location: Left Arm, Patient Position: Sitting, Cuff Size: Large)   Pulse (!) 107   Temp 98.1 F (36.7 C) (Temporal)   Ht 6\' 1"  (1.854 m)   Wt 229 lb 9.6 oz (104.1 kg)   SpO2 97%   BMI 30.29 kg/m  Wt Readings from Last 3 Encounters:  12/02/23 229 lb 9.6 oz (104.1 kg)  11/28/23 230 lb (104.3 kg)  10/28/23 226 lb 12.8 oz (102.9 kg)      Versa Gore, NP

## 2023-12-02 NOTE — Telephone Encounter (Signed)
 Pt was seen in office today. Pt states he is unable to get valium  even though RX was sent in. Pt would like for [facility] ([place]) to contact pharmacy.

## 2023-12-03 ENCOUNTER — Ambulatory Visit: Admitting: Surgical

## 2023-12-03 VITALS — BP 130/88 | HR 88

## 2023-12-03 DIAGNOSIS — L989 Disorder of the skin and subcutaneous tissue, unspecified: Secondary | ICD-10-CM

## 2023-12-03 NOTE — Progress Notes (Signed)
 38 year old male here for follow-up after excision of forehead skin lesion.  He is doing well.  He is not having any issues.  Prolene sutures are in place, these were removed.  Patient tolerated well.  Pathology not yet available.  Discussed with patient that I will be available in his MyChart, Dr. Carolynne Citron will receive results and if concerning, we will call.  Discussed importance of sunscreen, scar massage.  All the questions were answered to his content.  Offered recommendations for scar creams.

## 2023-12-04 LAB — DERMATOLOGY PATHOLOGY

## 2023-12-10 ENCOUNTER — Telehealth: Payer: Self-pay | Admitting: Orthopedic Surgery

## 2023-12-10 NOTE — Telephone Encounter (Signed)
 Pt's wife Jonathan Strong called requesting a letter stating Dr Sulema Endo did surgery on patient 05/01/23 and 05/07/23. What types of surgery he had an his time off work for both. Pt and wife states this had to be turned in to their insurance company. Please call Jonathan Strong about this matter at [phone]

## 2023-12-11 ENCOUNTER — Other Ambulatory Visit: Payer: Self-pay | Admitting: Family Medicine

## 2023-12-11 ENCOUNTER — Encounter: Payer: Self-pay | Admitting: Radiology

## 2023-12-11 DIAGNOSIS — F411 Generalized anxiety disorder: Secondary | ICD-10-CM

## 2023-12-11 NOTE — Telephone Encounter (Signed)
 I called and spoke with Mylinda Asa, She asked that the note be sent to his MyChart. This was done and I advised that she can send a MyChart message if they need anything else.

## 2023-12-29 ENCOUNTER — Ambulatory Visit (INDEPENDENT_AMBULATORY_CARE_PROVIDER_SITE_OTHER): Admitting: Family Medicine

## 2023-12-29 ENCOUNTER — Encounter: Payer: Self-pay | Admitting: Family Medicine

## 2023-12-29 VITALS — BP 121/82 | HR 74 | Temp 97.9°F | Ht 73.0 in | Wt 229.0 lb

## 2023-12-29 DIAGNOSIS — F325 Major depressive disorder, single episode, in full remission: Secondary | ICD-10-CM

## 2023-12-29 DIAGNOSIS — L578 Other skin changes due to chronic exposure to nonionizing radiation: Secondary | ICD-10-CM | POA: Diagnosis not present

## 2023-12-29 DIAGNOSIS — E785 Hyperlipidemia, unspecified: Secondary | ICD-10-CM | POA: Diagnosis not present

## 2023-12-29 DIAGNOSIS — M545 Low back pain, unspecified: Secondary | ICD-10-CM

## 2023-12-29 DIAGNOSIS — Z0001 Encounter for general adult medical examination with abnormal findings: Secondary | ICD-10-CM

## 2023-12-29 DIAGNOSIS — R739 Hyperglycemia, unspecified: Secondary | ICD-10-CM | POA: Diagnosis not present

## 2023-12-29 DIAGNOSIS — G8929 Other chronic pain: Secondary | ICD-10-CM

## 2023-12-29 LAB — COMPREHENSIVE METABOLIC PANEL WITH GFR
ALT: 55 U/L — ABNORMAL HIGH (ref 0–53)
AST: 36 U/L (ref 0–37)
Albumin: 4.7 g/dL (ref 3.5–5.2)
Alkaline Phosphatase: 73 U/L (ref 39–117)
BUN: 10 mg/dL (ref 6–23)
CO2: 26 meq/L (ref 19–32)
Calcium: 9.2 mg/dL (ref 8.4–10.5)
Chloride: 104 meq/L (ref 96–112)
Creatinine, Ser: 0.82 mg/dL (ref 0.40–1.50)
GFR: 110.18 mL/min (ref >60.00)
Glucose, Bld: 105 mg/dL — ABNORMAL HIGH (ref 70–99)
Potassium: 3.9 meq/L (ref 3.5–5.1)
Sodium: 138 meq/L (ref 135–145)
Total Bilirubin: 0.6 mg/dL (ref 0.2–1.2)
Total Protein: 7.3 g/dL (ref 6.0–8.3)

## 2023-12-29 LAB — CBC
HCT: 39.4 % (ref 39.0–52.0)
Hemoglobin: 13.1 g/dL (ref 13.0–17.0)
MCHC: 33.2 g/dL (ref 30.0–36.0)
MCV: 82.8 fl (ref 78.0–100.0)
Platelets: 222 10*3/uL (ref 150.0–400.0)
RBC: 4.76 Mil/uL (ref 4.22–5.81)
RDW: 16.1 % — ABNORMAL HIGH (ref 11.5–15.5)
WBC: 5.9 10*3/uL (ref 4.0–10.5)

## 2023-12-29 LAB — LIPID PANEL
Cholesterol: 247 mg/dL — ABNORMAL HIGH (ref 0–200)
HDL: 35.1 mg/dL — ABNORMAL LOW (ref >39.00)
NonHDL: 212.2
Total CHOL/HDL Ratio: 7
Triglycerides: 402 mg/dL — ABNORMAL HIGH (ref 0.0–149.0)
VLDL: 80.4 mg/dL — ABNORMAL HIGH (ref 0.0–40.0)

## 2023-12-29 LAB — HEMOGLOBIN A1C: Hgb A1c MFr Bld: 5.9 % (ref 4.6–6.5)

## 2023-12-29 LAB — TSH: TSH: 2.46 u[IU]/mL (ref 0.35–5.50)

## 2023-12-29 LAB — LDL CHOLESTEROL, DIRECT: Direct LDL: 137 mg/dL

## 2023-12-29 NOTE — Progress Notes (Signed)
 Chief Complaint:  Jonathan Strong is a 38 y.o. male who presents today for his annual comprehensive physical exam.    Assessment/Plan:  Chronic Problems Addressed Today: Sun-damaged skin Recently had hemangioma removed with plastic surgery however he wishes to have yearly skin exams.  Will place referral to dermatology for this.  Dyslipidemia We will check labs today.  He is doing great job with diet and exercise.  Chronic back pain, Low Back Pain His pain is improving since having his laminectomy about 6 months ago.  He does have Valium  to use as needed however uses this very infrequently.  Does not need refill today.  Major depression in remission (HCC) Stable on Celexa  30 mg daily.  Preventative Healthcare: Check labs.  Up-to-date on vaccines.  Patient Counseling(The following topics were reviewed and/or handout was given):  -Nutrition: Stressed importance of moderation in sodium/caffeine intake, saturated fat and cholesterol, caloric balance, sufficient intake of fresh fruits, vegetables, and fiber.  -Stressed the importance of regular exercise.   -Substance Abuse: Discussed cessation/primary prevention of tobacco, alcohol, or other drug use; driving or other dangerous activities under the influence; availability of treatment for abuse.   -Injury prevention: Discussed safety belts, safety helmets, smoke detector, smoking near bedding or upholstery.   -Sexuality: Discussed sexually transmitted diseases, partner selection, use of condoms, avoidance of unintended pregnancy and contraceptive alternatives.   -Dental health: Discussed importance of regular tooth brushing, flossing, and dental visits.  -Health maintenance and immunizations reviewed. Please refer to Health maintenance section.  Return to care in 1 year for next preventative visit.     Subjective:  HPI:  He has no acute complaints today. See Assessment / plan for status of chronic conditions.  He is here today  for annual physical.  Doing well today.   Lifestyle Diet: Balanced. Plenty of fruits and vegetables.  Exercise: Working out at home.      12/29/2023    8:03 AM  Depression screen PHQ 2/9  Decreased Interest 0  Down, Depressed, Hopeless 0  PHQ - 2 Score 0    There are no preventive care reminders to display for this patient.   ROS: Per HPI, otherwise a complete review of systems was negative.   PMH:  The following were reviewed and entered/updated in epic: Past Medical History:  Diagnosis Date   Depression    History of chicken pox    History of mononucleosis    treated at [facility] ER 3 weeks ago per patient   Hyperlipidemia    Patient Active Problem List   Diagnosis Date Noted   Sun-damaged skin 10/02/2023   Radiculopathy, lumbar region 07/07/2023   Recurrent displacement of lumbar disc 07/07/2023   Lumbar disc herniation 05/01/2023   Sleep-disordered breathing 10/21/2021   Bilateral carpal tunnel syndrome 09/12/2021   Major depression in remission (HCC) 05/24/2020   GAD (generalized anxiety disorder) 05/24/2020   Anger 08/30/2019   Dyslipidemia 08/26/2019   Stress 06/15/2017   Degeneration of lumbar intervertebral disc 03/09/2017   Chronic back pain, Low Back Pain 10/13/2014   Past Surgical History:  Procedure Laterality Date   DECOMPRESSIVE LUMBAR LAMINECTOMY LEVEL 1 N/A 07/07/2023   Procedure: L3-4 DECOMPRESSIVE LUMBAR LAMINECTOMY, L4-5 REVISION DECOMPRESSION AND DISCECTOMY;  Surgeon: Diedra Fowler, MD;  Location: [facility] OR;  Service: Orthopedics;  Laterality: N/A;   LUMBAR LAMINECTOMY N/A 05/01/2023   Procedure: Lumbar four-five MICRODISCECTOMY LUMBAR;  Surgeon: Diedra Fowler, MD;  Location: [facility] OR;  Service: Orthopedics;  Laterality: N/A;  WISDOM TOOTH EXTRACTION      Family History  Problem Relation Age of Onset   Mental illness Brother    Bipolar disorder Brother    Schizophrenia Father    Rectal cancer Neg Hx    Stomach cancer Neg Hx     Medications-  reviewed and updated Current Outpatient Medications  Medication Sig Dispense Refill   cetirizine -pseudoephedrine  (ZYRTEC -D) 5-120 MG tablet Take 1 tablet by mouth daily as needed for allergies or rhinitis. 30 tablet 0   citalopram  (CELEXA ) 20 MG tablet TAKE 1 AND 1/2 TABLETS BY MOUTH DAILY 45 tablet 2   diazepam  (VALIUM ) 10 MG tablet Take 1 tablet (10 mg total) by mouth every 8 (eight) hours as needed. 30 tablet 5   OVER THE COUNTER MEDICATION Take 2 capsules by mouth daily. Kiki green mushroom supplement     No current facility-administered medications for this visit.    Allergies-reviewed and updated Allergies  Allergen Reactions   Gabapentin  Other (See Comments)    Became suicidal   Hydrocodone Nausea And Vomiting   Oxycodone  Nausea And Vomiting    Social History   Socioeconomic History   Marital status: Married    Spouse name: Not on file   Number of children: 2   Years of education: Not on file   Highest education level: Not on file  Occupational History   Occupation: Holiday representative  Tobacco Use   Smoking status: Former    Current packs/day: 0.00    Types: Cigarettes    Quit date: 1998    Years since quitting: 27.4   Smokeless tobacco: Never  Vaping Use   Vaping status: Never Used  Substance and Sexual Activity   Alcohol use: Yes    Alcohol/week: 7.0 standard drinks of alcohol    Types: 7 Shots of liquor per week   Drug use: No   Sexual activity: Yes    Partners: Female  Other Topics Concern   Not on file  Social History Narrative   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[employer], Surveyor, mining - cabinets, custom      Home Situation: lives with wife and 2 daughters - 3 yrs and 3 months in 2016      Spiritual Beliefs: none      Lifestyle: no regular exercise; diet is ok      Social Drivers of Corporate investment banker Strain: Low Risk  (05/24/2020)   Overall Financial Resource Strain (CARDIA)    Difficulty of Paying Living Expenses: Not very hard  Food  Insecurity: No Food Insecurity (02/15/2020)   Hunger Vital Sign    Worried About Running Out of Food in the Last Year: Never true    Ran Out of Food in the Last Year: Never true  Transportation Needs: No Transportation Needs (02/15/2020)   PRAPARE - Administrator, Civil Service (Medical): No    Lack of Transportation (Non-Medical): No  Physical Activity: Inactive (02/15/2020)   Exercise Vital Sign    Days of Exercise per Week: 0 days    Minutes of Exercise per Session: 0 min  Stress: Stress Concern Present (05/24/2020)   [employer] of Occupational Health - Occupational Stress Questionnaire    Feeling of Stress : To some extent  Social Connections: Moderately Isolated (05/24/2020)   Social Connection and Isolation Panel [NHANES]    Frequency of Communication with Friends and Family: Twice a week    Frequency of Social Gatherings with Friends and Family: Twice a week  Attends Religious Services: Never    Active Member of Clubs or Organizations: No    Attends Banker Meetings: Never    Marital Status: Married        Objective:  Physical Exam: BP 121/82   Pulse 74   Temp 97.9 F (36.6 C) (Temporal)   Ht 6\' 1"  (1.854 m)   Wt 229 lb (103.9 kg)   SpO2 99%   BMI 30.21 kg/m   Body mass index is 30.21 kg/m. Wt Readings from Last 3 Encounters:  12/29/23 229 lb (103.9 kg)  12/02/23 229 lb 9.6 oz (104.1 kg)  11/28/23 230 lb (104.3 kg)   Gen: NAD, resting comfortably HEENT: TMs normal bilaterally. OP clear. No thyromegaly noted.  CV: RRR with no murmurs appreciated Pulm: NWOB, CTAB with no crackles, wheezes, or rhonchi GI: Normal bowel sounds present. Soft, Nontender, Nondistended. MSK: no edema, cyanosis, or clubbing noted Skin: warm, dry Neuro: CN2-12 grossly intact. Strength 5/5 in upper and lower extremities. Reflexes symmetric and intact bilaterally.  Psych: Normal affect and thought content     Briggette Najarian M. Daneil Dunker, MD 12/29/2023 8:23 AM

## 2023-12-29 NOTE — Assessment & Plan Note (Signed)
 His pain is improving since having his laminectomy about 6 months ago.  He does have Valium  to use as needed however uses this very infrequently.  Does not need refill today.

## 2023-12-29 NOTE — Patient Instructions (Addendum)
 It was very nice to see you today!  We will check blood work  Keep up the great work with diet and exercise!  We will refer you to dermatology.  I will see you back in 1 year for your next physical.  Please come back sooner if needed.  Return in about 1 year (around 12/28/2024) for Annual Physical.   Take care, Dr Daneil Dunker  PLEASE NOTE:  If you had any lab tests, please let us  know if you have not heard back within a few days. You may see your results on mychart before we have a chance to review them but we will give you a call once they are reviewed by us .   If we ordered any referrals today, please let us  know if you have not heard from their office within the next week.   If you had any urgent prescriptions sent in today, please check with the pharmacy within an hour of our visit to make sure the prescription was transmitted appropriately.   Please try these tips to maintain a healthy lifestyle:  Eat at least 3 REAL meals and 1-2 snacks per day.  Aim for no more than 5 hours between eating.  If you eat breakfast, please do so within one hour of getting up.   Each meal should contain half fruits/vegetables, one quarter protein, and one quarter carbs (no bigger than a computer mouse)  Cut down on sweet beverages. This includes juice, soda, and sweet tea.   Drink at least 1 glass of water with each meal and aim for at least 8 glasses per day  Exercise at least 150 minutes every week.     Preventive Care 9-28 Years Old, Male Preventive care refers to lifestyle choices and visits with your health care provider that can promote health and wellness. Preventive care visits are also called wellness exams. What can I expect for my preventive care visit? Counseling During your preventive care visit, your health care provider may ask about your: Medical history, including: Past medical problems. Family medical history. Current health, including: Emotional well-being. Home life and  relationship well-being. Sexual activity. Lifestyle, including: Alcohol, nicotine or tobacco, and drug use. Access to firearms. Diet, exercise, and sleep habits. Safety issues such as seatbelt and bike helmet use. Sunscreen use. Work and work Astronomer. Physical exam Your health care provider will check your: Height and weight. These may be used to calculate your BMI (body mass index). BMI is a measurement that tells if you are at a healthy weight. Waist circumference. This measures the distance around your waistline. This measurement also tells if you are at a healthy weight and may help predict your risk of certain diseases, such as type 2 diabetes and high blood pressure. Heart rate and blood pressure. Body temperature. Skin for abnormal spots. What immunizations do I need?  Vaccines are usually given at various ages, according to a schedule. Your health care provider will recommend vaccines for you based on your age, medical history, and lifestyle or other factors, such as travel or where you work. What tests do I need? Screening Your health care provider may recommend screening tests for certain conditions. This may include: Lipid and cholesterol levels. Diabetes screening. This is done by checking your blood sugar (glucose) after you have not eaten for a while (fasting). Hepatitis B test. Hepatitis C test. HIV (human immunodeficiency virus) test. STI (sexually transmitted infection) testing, if you are at risk. Lung cancer screening. Prostate cancer screening. Colorectal cancer  screening. Talk with your health care provider about your test results, treatment options, and if necessary, the need for more tests. Follow these instructions at home: Eating and drinking  Eat a diet that includes fresh fruits and vegetables, whole grains, lean protein, and low-fat dairy products. Take vitamin and mineral supplements as recommended by your health care provider. Do not drink  alcohol if your health care provider tells you not to drink. If you drink alcohol: Limit how much you have to 0-2 drinks a day. Know how much alcohol is in your drink. In the [place] one drink equals one 12 oz bottle of beer (355 mL), one 5 oz glass of wine (148 mL), or one 1 oz glass of hard liquor (44 mL). Lifestyle Brush your teeth every morning and night with fluoride toothpaste. Floss one time each day. Exercise for at least 30 minutes 5 or more days each week. Do not use any products that contain nicotine or tobacco. These products include cigarettes, chewing tobacco, and vaping devices, such as e-cigarettes. If you need help quitting, ask your health care provider. Do not use drugs. If you are sexually active, practice safe sex. Use a condom or other form of protection to prevent STIs. Take aspirin only as told by your health care provider. Make sure that you understand how much to take and what form to take. Work with your health care provider to find out whether it is safe and beneficial for you to take aspirin daily. Find healthy ways to manage stress, such as: Meditation, yoga, or listening to music. Journaling. Talking to a trusted person. Spending time with friends and family. Minimize exposure to UV radiation to reduce your risk of skin cancer. Safety Always wear your seat belt while driving or riding in a vehicle. Do not drive: If you have been drinking alcohol. Do not ride with someone who has been drinking. When you are tired or distracted. While texting. If you have been using any mind-altering substances or drugs. Wear a helmet and other protective equipment during sports activities. If you have firearms in your house, make sure you follow all gun safety procedures. What's next? Go to your health care provider once a year for an annual wellness visit. Ask your health care provider how often you should have your eyes and teeth checked. Stay up to date on all  vaccines. This information is not intended to replace advice given to you by your health care provider. Make sure you discuss any questions you have with your health care provider. Document Revised: 01/09/2021 Document Reviewed: 01/09/2021 Elsevier Patient Education  2024 [employer].

## 2023-12-29 NOTE — Assessment & Plan Note (Signed)
Stable on Celexa 30 mg daily.

## 2023-12-29 NOTE — Assessment & Plan Note (Signed)
 Recently had hemangioma removed with plastic surgery however he wishes to have yearly skin exams.  Will place referral to dermatology for this.

## 2023-12-29 NOTE — Assessment & Plan Note (Signed)
 We will check labs today.  He is doing great job with diet and exercise.

## 2023-12-30 ENCOUNTER — Encounter: Admitting: Family Medicine

## 2023-12-31 ENCOUNTER — Ambulatory Visit: Admitting: Orthopedic Surgery

## 2024-01-01 ENCOUNTER — Ambulatory Visit: Payer: Self-pay | Admitting: Family Medicine

## 2024-01-01 DIAGNOSIS — R748 Abnormal levels of other serum enzymes: Secondary | ICD-10-CM

## 2024-01-01 NOTE — Progress Notes (Signed)
 One of his liver numbers is very mildly elevated.  This is probably nothing to worry about however recommend he come back.  A few weeks to recheck.  Triglycerides and cholesterol are elevated but overall about the same as his last few years.  Do not need to start meds but he should continue to work on diet and exercise and we can recheck in a year.  His blood sugar is also borderline elevated.  We do not need to start meds for this either however this should improve with diet and exercise.  The rest of his labs are all at goal.  We can recheck everything else in a year.

## 2024-01-11 ENCOUNTER — Ambulatory Visit: Payer: Self-pay | Admitting: Family Medicine

## 2024-01-11 ENCOUNTER — Other Ambulatory Visit (INDEPENDENT_AMBULATORY_CARE_PROVIDER_SITE_OTHER)

## 2024-01-11 DIAGNOSIS — R748 Abnormal levels of other serum enzymes: Secondary | ICD-10-CM | POA: Diagnosis not present

## 2024-01-11 LAB — COMPREHENSIVE METABOLIC PANEL WITH GFR
ALT: 54 U/L — ABNORMAL HIGH (ref 0–53)
AST: 31 U/L (ref 0–37)
Albumin: 4.5 g/dL (ref 3.5–5.2)
Alkaline Phosphatase: 72 U/L (ref 39–117)
BUN: 11 mg/dL (ref 6–23)
CO2: 26 meq/L (ref 19–32)
Calcium: 9 mg/dL (ref 8.4–10.5)
Chloride: 105 meq/L (ref 96–112)
Creatinine, Ser: 0.79 mg/dL (ref 0.40–1.50)
GFR: 111.4 mL/min (ref >60.00)
Glucose, Bld: 104 mg/dL — ABNORMAL HIGH (ref 70–99)
Potassium: 4.1 meq/L (ref 3.5–5.1)
Sodium: 138 meq/L (ref 135–145)
Total Bilirubin: 0.4 mg/dL (ref 0.2–1.2)
Total Protein: 7.3 g/dL (ref 6.0–8.3)

## 2024-01-11 NOTE — Progress Notes (Signed)
 He still has slight elevation in his liver labs but it is trending back to normal.  Recommend we recheck again in 3 to 6 months.

## 2024-02-05 ENCOUNTER — Encounter: Payer: Self-pay | Admitting: Adult Health

## 2024-02-05 ENCOUNTER — Ambulatory Visit (INDEPENDENT_AMBULATORY_CARE_PROVIDER_SITE_OTHER)

## 2024-02-05 ENCOUNTER — Ambulatory Visit (INDEPENDENT_AMBULATORY_CARE_PROVIDER_SITE_OTHER): Admitting: Adult Health

## 2024-02-05 ENCOUNTER — Ambulatory Visit: Payer: Self-pay

## 2024-02-05 VITALS — BP 120/82 | HR 81 | Temp 98.7°F | Resp 97

## 2024-02-05 DIAGNOSIS — H6690 Otitis media, unspecified, unspecified ear: Secondary | ICD-10-CM | POA: Diagnosis not present

## 2024-02-05 DIAGNOSIS — J029 Acute pharyngitis, unspecified: Secondary | ICD-10-CM

## 2024-02-05 DIAGNOSIS — R0989 Other specified symptoms and signs involving the circulatory and respiratory systems: Secondary | ICD-10-CM

## 2024-02-05 LAB — POCT RAPID STREP A (OFFICE): Rapid Strep A Screen: NEGATIVE

## 2024-02-05 MED ORDER — AMOXICILLIN-POT CLAVULANATE 875-125 MG PO TABS: 1.0000 | ORAL_TABLET | Freq: Two times a day (BID) | ORAL | 0 refills | Status: DC

## 2024-02-05 NOTE — Telephone Encounter (Signed)
 noted

## 2024-02-05 NOTE — Patient Instructions (Addendum)
 It was great meeting you today   I am going to send in an antibiotic called Augmentin  for a suspected ear infection   I am also going to get a chest xray today. We will follow up with you once the chest xray has resulted

## 2024-02-05 NOTE — Telephone Encounter (Signed)
 FYI Only or Action Required?: FYI only for provider.  Patient was last seen in primary care on 12/29/2023 by Kennyth Worth HERO, MD.  Called Nurse Triage reporting Sore Throat, ear congestion, Hoarse, and Chest Pain.  Symptoms began several days ago.  Interventions attempted: Nothing.  Symptoms are: hoarse voice, ear congestion, chest  congestion and soreness, mild SOB with exertion, gradually worsening.  Triage Disposition: See HCP Within 4 Hours (Or PCP Triage)  Patient/caregiver understands and will follow disposition?: Yes       Copied from CRM [id number]. Topic: Clinical - Red Word Triage >> Feb 05, 2024  2:19 PM Shereese L wrote: [facility] that prompted transfer to Nurse Triage:  Feels like walking pneumonia Build up in lungs and throat feels raw, minor congestion Ears feel like fluid is in them Reason for Disposition  MILD difficulty breathing (e.g., minimal/no SOB at rest, SOB with walking, pulse <100)  Answer Assessment - Initial Assessment Questions Patient refused urgent care, virtual or telehealth appointments and states he would like someone to listen with a stethoscope and look down his throat. No availability at [facility], patient asking to go to [place] location. Called [facility] and spoke with Bari, confirmed okay to book appointment with [facility] and advise patient to wear a mask and call when he has arrived and wait in his car. Patient verbalizes understanding.   1. COVID-19 ONSET: When did the symptoms of COVID-19 first start?     June 30th.  2. DIAGNOSIS CONFIRMATION: How were you diagnosed? (e.g., COVID-19 oral or nasal viral test; COVID-19 antibody test; doctor visit)     Home test.  3. MAIN SYMPTOM:  What is your main concern or symptom right now? (e.g., breathing difficulty, cough, fatigue. loss of smell)     Hoarse voice, sore throat, ears congested, chest congestion and sore.  4. SYMPTOM ONSET: When did the  symptoms  start?     Patient  states he went to work on Monday and felt fine, he states when he tested positive for COVID he didn't have these symptoms and they started on Wednesday.(02/03/24)  5. BETTER-SAME-WORSE: Are you getting better, staying the same, or getting worse over the last 1 to 2 weeks?     Worse.  6. RECENT MEDICAL VISIT: Have you been seen by a healthcare provider (doctor, NP, PA) for these persisting COVID-19 symptoms? If Yes, ask: When were you seen? (e.g., date)     No.  7. COUGH: Do you have a cough? If Yes, ask: How bad is the cough?       No.  8. FEVER: Do you have a fever? If Yes, ask: What is your temperature, how was it measured, and when did it start?     No.  9. BREATHING DIFFICULTY: Are you having any trouble breathing? If Yes, ask: How bad is your breathing? (e.g., mild, moderate, severe)    - MILD: No SOB at rest, mild SOB with walking, speaks normally in sentences, can lie down, no retractions, pulse < 100.    - MODERATE: SOB at rest, SOB with minimal exertion and prefers to sit, cannot lie down flat, speaks in phrases, mild retractions, audible wheezing, pulse 100-120.    - SEVERE: Very SOB at rest, speaks in single words, struggling to breathe, sitting hunched forward, retractions, pulse > 120.       He states my breathing has been pretty crappy latley, SOB for sure. Patient speaking in full sentences, no labored breathing or wheezing. Mild SOB  at rest/walking.  10. OTHER SYMPTOMS: Do you have any other symptoms?  (e.g., fatigue, headache, muscle pain, weakness)       None.  11. HIGH RISK DISEASE: Do you have any chronic medical problems? (e.g., asthma, heart or lung disease, weak immune system, obesity, etc.)       No.  12. VACCINE: Have you gotten the COVID-19 vaccine? If Yes, ask: Which one, how many shots, when did you get it?       Yes, 3 shots. He states it has been a couple of years.  13. PREGNANCY: Is there any chance you are pregnant? When was  your last menstrual period?       N/A.  14. O2 SATURATION MONITOR:  Do you use an oxygen saturation monitor (pulse oximeter) at home? If Yes, ask What is your reading (oxygen level) today? What is your usual oxygen saturation reading? (e.g., 95%)       No.  Protocols used: Coronavirus (COVID-19) Persisting Symptoms Follow-up Call-A-AH, Coronavirus (COVID-19) Diagnosed or Suspected-A-AH

## 2024-02-05 NOTE — Progress Notes (Signed)
 Subjective:    Patient ID: Jonathan Strong, male    DOB: [birth year], 38 y.o.   MRN: [id number]  HPI  38 year old male who  has a past medical history of Depression, History of chicken pox, History of mononucleosis, and Hyperlipidemia.  He presents to the office today for an acute issue.   He reports testing positive for Covid 19 about 9 days ago. He then started to feel better until about two days ago when he developed a sore throat and bilateral ear pain,which then progressed into  mild shortness of breath, and chest congestion  feels like fluid is building up in my chest, He denies fevers, chills,or wheezing.    Review of Systems See HPI   Past Medical History:  Diagnosis Date   Depression    History of chicken pox    History of mononucleosis    treated at [facility] ER 3 weeks ago per patient   Hyperlipidemia     Social History   Socioeconomic History   Marital status: Married    Spouse name: Not on file   Number of children: 2   Years of education: Not on file   Highest education level: Not on file  Occupational History   Occupation: Holiday representative  Tobacco Use   Smoking status: Former    Current packs/day: 0.00    Types: Cigarettes    Quit date: 1998    Years since quitting: 27.5   Smokeless tobacco: Never  Vaping Use   Vaping status: Never Used  Substance and Sexual Activity   Alcohol use: Yes    Alcohol/week: 7.0 standard drinks of alcohol    Types: 7 Shots of liquor per week   Drug use: No   Sexual activity: Yes    Partners: Female  Other Topics Concern   Not on file  Social History Narrative   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[employer], Surveyor, mining - cabinets, custom      Home Situation: lives with wife and 2 daughters - 3 yrs and 3 months in 2016      Spiritual Beliefs: none      Lifestyle: no regular exercise; diet is ok      Social Drivers of Corporate investment banker Strain: Low Risk  (05/24/2020)   Overall Financial Resource  Strain (CARDIA)    Difficulty of Paying Living Expenses: Not very hard  Food Insecurity: No Food Insecurity (02/15/2020)   Hunger Vital Sign    Worried About Running Out of Food in the Last Year: Never true    Ran Out of Food in the Last Year: Never true  Transportation Needs: No Transportation Needs (02/15/2020)   PRAPARE - Administrator, Civil Service (Medical): No    Lack of Transportation (Non-Medical): No  Physical Activity: Inactive (02/15/2020)   Exercise Vital Sign    Days of Exercise per Week: 0 days    Minutes of Exercise per Session: 0 min  Stress: Stress Concern Present (05/24/2020)   [employer] of Occupational Health - Occupational Stress Questionnaire    Feeling of Stress : To some extent  Social Connections: Moderately Isolated (05/24/2020)   Social Connection and Isolation Panel    Frequency of Communication with Friends and Family: Twice a week    Frequency of Social Gatherings with Friends and Family: Twice a week    Attends Religious Services: Never    Database administrator or Organizations: No    Attends [employer]  or Organization Meetings: Never    Marital Status: Married  Catering manager Violence: Not At Risk (02/15/2020)   Humiliation, Afraid, Rape, and Kick questionnaire    Fear of Current or Ex-Partner: No    Emotionally Abused: No    Physically Abused: No    Sexually Abused: No    Past Surgical History:  Procedure Laterality Date   DECOMPRESSIVE LUMBAR LAMINECTOMY LEVEL 1 N/A 07/07/2023   Procedure: L3-4 DECOMPRESSIVE LUMBAR LAMINECTOMY, L4-5 REVISION DECOMPRESSION AND DISCECTOMY;  Surgeon: Georgina Ozell LABOR, MD;  Location: [facility] OR;  Service: Orthopedics;  Laterality: N/A;   LUMBAR LAMINECTOMY N/A 05/01/2023   Procedure: Lumbar four-five MICRODISCECTOMY LUMBAR;  Surgeon: Georgina Ozell LABOR, MD;  Location: [facility] OR;  Service: Orthopedics;  Laterality: N/A;   WISDOM TOOTH EXTRACTION      Family History  Problem Relation Age of Onset   Mental  illness Brother    Bipolar disorder Brother    Schizophrenia Father    Rectal cancer Neg Hx    Stomach cancer Neg Hx     Allergies  Allergen Reactions   Gabapentin  Other (See Comments)    Became suicidal   Hydrocodone Nausea And Vomiting   Oxycodone  Nausea And Vomiting    Current Outpatient Medications on File Prior to Visit  Medication Sig Dispense Refill   citalopram  (CELEXA ) 20 MG tablet TAKE 1 AND 1/2 TABLETS BY MOUTH DAILY 45 tablet 2   diazepam  (VALIUM ) 10 MG tablet Take 1 tablet (10 mg total) by mouth every 8 (eight) hours as needed. 30 tablet 5   cetirizine -pseudoephedrine  (ZYRTEC -D) 5-120 MG tablet Take 1 tablet by mouth daily as needed for allergies or rhinitis. (Patient not taking: Reported on 02/05/2024) 30 tablet 0   OVER THE COUNTER MEDICATION Take 2 capsules by mouth daily. Kiki green mushroom supplement     No current facility-administered medications on file prior to visit.    BP 120/82   Pulse 81   Temp 98.7 F (37.1 C)   Resp (!) 97       Objective:   Physical Exam Vitals and nursing note reviewed.  Constitutional:      Appearance: Normal appearance.  HENT:     Right Ear: No drainage. A middle ear effusion is present. Tympanic membrane is not erythematous or bulging.     Left Ear: No drainage. A middle ear effusion is present. Tympanic membrane is erythematous and bulging.  Cardiovascular:     Rate and Rhythm: Normal rate and regular rhythm.     Pulses: Normal pulses.     Heart sounds: Normal heart sounds.  Pulmonary:     Effort: Pulmonary effort is normal.     Breath sounds: Normal breath sounds.  Musculoskeletal:        General: Normal range of motion.  Skin:    General: Skin is warm and dry.  Neurological:     General: No focal deficit present.     Mental Status: He is alert and oriented to person, place, and time.  Psychiatric:        Mood and Affect: Mood normal.        Behavior: Behavior normal.        Thought Content: Thought content  normal.        Judgment: Judgment normal.       Assessment & Plan:   1. Acute otitis media, unspecified otitis media type (Primary) - Will treat for acute otitis media. Advised to use Flonase as well. He can take Tylenol   and or motrin  as needed - amoxicillin -clavulanate (AUGMENTIN ) 875-125 MG tablet; Take 1 tablet by mouth 2 (two) times daily.  Dispense: 20 tablet; Refill: 0  2. Chest congestion - Lungs clear on exam but will get xray due to recent covid infection  - DG Chest 2 View; Future  3. Sore throat  - POC Rapid Strep A- negative  Darleene Shape, NP

## 2024-02-09 ENCOUNTER — Ambulatory Visit: Payer: Self-pay | Admitting: Adult Health

## 2024-02-29 ENCOUNTER — Other Ambulatory Visit: Payer: Self-pay | Admitting: Family Medicine

## 2024-02-29 DIAGNOSIS — G8929 Other chronic pain: Secondary | ICD-10-CM

## 2024-02-29 NOTE — Telephone Encounter (Unsigned)
 Copied from CRM #[id number]. Topic: Clinical - Medication Refill >> Feb 29, 2024  1:13 PM Suzen RAMAN wrote: Medication: diazepam  (VALIUM ) 10 MG tablet  Has the patient contacted their pharmacy? Yes  This is the patient's preferred pharmacy:  [id number] - [place], [place] - [address] AT [address] OF [facility] [address] [place] [address] Phone: [phone] Fax: [phone]  Is this the correct pharmacy for this prescription? Yes If no, delete pharmacy and type the correct one.   Has the prescription been filled recently? No  Is the patient out of the medication? Yes  Has the patient been seen for an appointment in the last year OR does the patient have an upcoming appointment? Yes  Can we respond through MyChart? Yes  Agent: Please be advised that Rx refills may take up to 3 business days. We ask that you follow-up with your pharmacy.

## 2024-03-01 MED ORDER — DIAZEPAM 10 MG PO TABS: 10.0000 mg | ORAL_TABLET | Freq: Three times a day (TID) | ORAL | 5 refills | Status: DC | PRN

## 2024-03-16 ENCOUNTER — Ambulatory Visit: Payer: Self-pay

## 2024-03-16 NOTE — Telephone Encounter (Signed)
 Noted, scheduled with Dr. Kennyth 03/18/24. Hx of bilat carpal tunnel manage by Dr. Artist Lloyd.

## 2024-03-16 NOTE — Telephone Encounter (Signed)
 FYI Only or Action Required?: Action required by provider: request for appointment.  Patient was last seen in primary care on 02/05/2024 by Merna Huxley, NP.  Called Nurse Triage reporting Arm Pain.  Symptoms began several months ago.  Interventions attempted: OTC medications: tylenol , ibuprofen .  Symptoms are: gradually worsening.  Triage Disposition: See PCP When Office is Open (Within 3 Days)  Patient/caregiver understands and will follow disposition?: YesCopied from CRM #[id number]. Topic: Clinical - Red Word Triage >> Mar 16, 2024 10:03 AM Viola FALCON wrote: Red Word that prompted transfer to Nurse Triage: Patient in both elbows for 8 months Reason for Disposition  [1] MODERATE pain (e.g., interferes with normal activities) AND [2] present > 3 days  Answer Assessment - Initial Assessment Questions Pain is constant but intensity changes when lifting or bumping . Not sure of cause.  Pt has tried tylenol  and ibuprofen  over time. OTC medication is not helping.      1. ONSET: When did the pain start?     8 months 2. LOCATION: Where is the pain located?     Both elbows 3. PAIN: How bad is the pain? (Scale 1-10; or mild, moderate, severe)     3-7 4. WORK OR EXERCISE: Has there been any recent work or exercise that involved this part of the body?     Custom wood working 5. CAUSE: What do you think is causing the elbow pain?     Not sure 6. OTHER SYMPTOMS: Do you have any other symptoms? (e.g., neck pain, elbow swelling, rash, fever)     denies  Protocols used: Elbow Pain-A-AH

## 2024-03-18 ENCOUNTER — Encounter: Payer: Self-pay | Admitting: Family Medicine

## 2024-03-18 ENCOUNTER — Ambulatory Visit: Admitting: Family Medicine

## 2024-03-18 ENCOUNTER — Other Ambulatory Visit: Payer: Self-pay | Admitting: Family Medicine

## 2024-03-18 VITALS — BP 128/79 | HR 72 | Temp 97.5°F | Ht 73.0 in | Wt 224.0 lb

## 2024-03-18 DIAGNOSIS — G8929 Other chronic pain: Secondary | ICD-10-CM | POA: Diagnosis not present

## 2024-03-18 DIAGNOSIS — M545 Low back pain, unspecified: Secondary | ICD-10-CM

## 2024-03-18 DIAGNOSIS — F411 Generalized anxiety disorder: Secondary | ICD-10-CM

## 2024-03-18 DIAGNOSIS — F325 Major depressive disorder, single episode, in full remission: Secondary | ICD-10-CM | POA: Diagnosis not present

## 2024-03-18 MED ORDER — MELOXICAM 15 MG PO TABS: 15.0000 mg | ORAL_TABLET | Freq: Every day | ORAL | 0 refills | Status: DC

## 2024-03-18 NOTE — Assessment & Plan Note (Signed)
 Pain overall stable.  Uses Valium  as needed for back spasms.  Does not need refill today.

## 2024-03-18 NOTE — Assessment & Plan Note (Signed)
Stable on Celexa 30 mg daily.

## 2024-03-18 NOTE — Telephone Encounter (Signed)
Appt 8/22

## 2024-03-18 NOTE — Patient Instructions (Addendum)
 It was very nice to see you today!  VISIT SUMMARY: You visited us  today due to persistent bilateral elbow pain that has been ongoing for nearly a year. The pain is likely due to conditions known as tennis elbow and golfer's elbow.  YOUR PLAN: BILATERAL LATERAL AND MEDIAL EPICONDYLITIS (TENNIS AND GOLFER'S ELBOW): You have chronic pain in both elbows, which is consistent with conditions known as tennis elbow and golfer's elbow. Over-the-counter pain medications have not been effective for you. -Start taking meloxicam  as prescribed to help reduce inflammation. -Use a tennis elbow strap on both elbows to help alleviate the pain. -Follow the home exercises and stretches provided in the handouts. -If there is no improvement in a few weeks, we may consider physical therapy or cortisone injections. -Schedule a follow-up appointment in a few weeks to assess your progress.  Return if symptoms worsen or fail to improve.   Take care, Dr Kennyth  PLEASE NOTE:  If you had any lab tests, please let us  know if you have not heard back within a few days. You may see your results on mychart before we have a chance to review them but we will give you a call once they are reviewed by us .   If we ordered any referrals today, please let us  know if you have not heard from their office within the next week.   If you had any urgent prescriptions sent in today, please check with the pharmacy within an hour of our visit to make sure the prescription was transmitted appropriately.   Please try these tips to maintain a healthy lifestyle:  Eat at least 3 REAL meals and 1-2 snacks per day.  Aim for no more than 5 hours between eating.  If you eat breakfast, please do so within one hour of getting up.   Each meal should contain half fruits/vegetables, one quarter protein, and one quarter carbs (no bigger than a computer mouse)  Cut down on sweet beverages. This includes juice, soda, and sweet tea.   Drink at least  1 glass of water with each meal and aim for at least 8 glasses per day  Exercise at least 150 minutes every week.

## 2024-03-18 NOTE — Progress Notes (Signed)
   Jonathan Strong is a 38 y.o. male who presents today for an office visit.  Assessment/Plan:  New/Acute Problems: Bilateral elbow pain Exam consistent with bilateral lateral and medial epicondylitis.  He has not had much improved with over-the-counter meds.  Will start meloxicam .  Also discussed home exercise program and handout was given.  Also discussed counterforce bracing.  He will follow-up with us  in a few weeks.  If no improvement would consider referral to physical therapy or sports medicine.  Chronic Problems Addressed Today: Chronic back pain, Low Back Pain Pain overall stable.  Uses Valium  as needed for back spasms.  Does not need refill today.  Major depression in remission (HCC) Stable on Celexa  30 mg daily.  GAD (generalized anxiety disorder) Symptoms stable on Celexa  30 mg daily.  Also has Valium  to use as needed     Subjective:  HPI:  See assessment / plan for status of chronic conditions.    Discussed the use of AI scribe software for clinical note transcription with the patient, who gave verbal consent to proceed.  History of Present Illness Jonathan Strong is a 38 year old male who presents with bilateral elbow pain persisting for nearly a year.  He has experienced bilateral elbow pain for close to a year, described as soreness, which is exacerbated by lifting objects, bumping into things, and repetitive motions. The pain is located on both sides of the elbows and worsens with certain sleeping positions.  He has attempted to manage the pain with ibuprofen  and Tylenol , but these have not provided relief. The pain has remained consistent over the past few months without significant improvement or worsening. He recalls engaging in several heavy lifting projects earlier in the year, which may have contributed to the onset of symptoms. Activities such as wringing out a rag in the shower exacerbate the pain.  Current medications include Valium , which  he has refills for, and he has previously used meloxicam  for pain management. Ibuprofen  and Tylenol  are ineffective for his elbow pain. Twisting motions, such as opening a doorknob, can cause pain, particularly when performed in certain directions.         Objective:  Physical Exam: BP 128/79   Pulse 72   Temp (!) 97.5 F (36.4 C) (Temporal)   Ht 6' 1 (1.854 m)   Wt 224 lb (101.6 kg)   SpO2 99%   BMI 29.55 kg/m   Gen: No acute distress, resting comfortably CV: Regular rate and rhythm with no murmurs appreciated Pulm: Normal work of breathing, clear to auscultation bilaterally with no crackles, wheezes, or rhonchi MUSCULOSKELETAL: - Arms: No deformities.  Tenderness to palpation of bilateral medial and lateral epicondyles.  Pain elicited bilaterally with resisted extension of third digit as well as resisted pronation and supination of wrists bilaterally. Neuro: Grossly normal, moves all extremities Psych: Normal affect and thought content      Pia Jedlicka M. Kennyth, MD 03/18/2024 12:18 PM

## 2024-03-18 NOTE — Assessment & Plan Note (Signed)
 Symptoms stable on Celexa  30 mg daily.  Also has Valium  to use as needed

## 2024-04-14 ENCOUNTER — Other Ambulatory Visit

## 2024-04-14 ENCOUNTER — Other Ambulatory Visit: Payer: Self-pay | Admitting: Family Medicine

## 2024-04-27 ENCOUNTER — Ambulatory Visit (INDEPENDENT_AMBULATORY_CARE_PROVIDER_SITE_OTHER): Admitting: Orthopedic Surgery

## 2024-04-27 ENCOUNTER — Other Ambulatory Visit (INDEPENDENT_AMBULATORY_CARE_PROVIDER_SITE_OTHER): Payer: Self-pay

## 2024-04-27 DIAGNOSIS — Z9889 Other specified postprocedural states: Secondary | ICD-10-CM

## 2024-04-27 MED ORDER — METHYLPREDNISOLONE 4 MG PO TBPK: ORAL_TABLET | ORAL | 0 refills | Status: DC

## 2024-04-27 NOTE — Progress Notes (Signed)
 Orthopedic Surgery Post-operative Office Visit   Procedure: L3/4 laminectomy, L4/5 revision microdiscectomy Date of Surgery: 07/07/2023 (~10 months post-op)   Assessment: Patient is a 38 y.o. who had been doing well after surgery until lifting some heavy cabinets on his own after which he developed left buttock and lateral hip pain     Plan: -No operative plans at this time -Explained that most acute pains do tend to get better with conservative treatment -Prescribed a medrol  dose pak. Told him to stop the meloxicam  while he is using that but he can resume after he is done with the medrol  dose pak. Can continue to use the valium  he was previously prescribed -Return to office 4 weeks, x-rays needed at next visit: none   ___________________________________________________________________________     Subjective: Patient was doing well with no significant back or leg pain until about 4 weeks ago. He said he was at a job site where he was supposed to have help unloading some heavy cabinets but the help never arrived so he ended up unloading all the cabinets himself. Since that date, he has had left-sided buttock pain and lateral hip pain on that side. No pain radiating further down the leg. No pain radiating into the right lower extremity. He has tried using valium  and meloxicam .    Objective:   General: no acute distress, appropriate affect Neurologic: alert, answering questions appropriately, following commands Respiratory: unlabored breathing on room air Skin: incision is well healed   MSK (spine):   -Strength exam                                                   Left                  Right   EHL                              5/5                  5/5 TA                                 5/5                  5/5 GSC                             5/5                  5/5 Knee extension            5/5                  5/5 Hip flexion                    5/5                  5/5    -Sensory exam                           Sensation intact to light touch in L3-S1 nerve distributions of bilateral lower extremities  -Left hip exam:  negative stinchfield, negative FABER, negative FADIR, negative SI joint compression test, no pain through range of motion   Imaging: XRs of the lumbar spine from 04/27/2024 were independently reviewed and interpreted, showing disc height loss at L4/5 and L5/S1. No other significant degenerative changes seen. No evidence of instability on flexion/extension views. No fracture or dislocation seen. Laminectomy defect at L3/4.      Patient name: Jonathan Strong Patient MRN: [id number] Date of visit: 04/27/24

## 2024-05-12 ENCOUNTER — Other Ambulatory Visit: Payer: Self-pay | Admitting: *Deleted

## 2024-05-12 MED ORDER — MELOXICAM 15 MG PO TABS: 15.0000 mg | ORAL_TABLET | Freq: Every day | ORAL | 0 refills | Status: DC

## 2024-05-17 ENCOUNTER — Other Ambulatory Visit: Payer: Self-pay | Admitting: Family Medicine

## 2024-05-17 DIAGNOSIS — F411 Generalized anxiety disorder: Secondary | ICD-10-CM

## 2024-05-17 NOTE — Telephone Encounter (Unsigned)
 Copied from CRM [id number]. Topic: Clinical - Medication Refill >> May 17, 2024  3:29 PM Shereese L wrote: Medication: meloxicam  (MOBIC ) 15 MG tablet  citalopram  (CELEXA ) 20 MG tablet  Has the patient contacted their pharmacy? Yes (Agent: If no, request that the patient contact the pharmacy for the refill. If patient does not wish to contact the pharmacy document the reason why and proceed with request.) (Agent: If yes, when and what did the pharmacy advise?)  This is the patient's preferred pharmacy:  [facility] - [place], [place] - [address] [phone] [address] [place] [address]    Is this the correct pharmacy for this prescription? Yes If no, delete pharmacy and type the correct one.   Has the prescription been filled recently? Yes  Is the patient out of the medication? Yes  Has the patient been seen for an appointment in the last year OR does the patient have an upcoming appointment? Yes  Can we respond through MyChart? Yes  Agent: Please be advised that Rx refills may take up to 3 business days. We ask that you follow-up with your pharmacy.

## 2024-05-18 MED ORDER — CITALOPRAM HYDROBROMIDE 20 MG PO TABS: 30.0000 mg | ORAL_TABLET | Freq: Every day | ORAL | 3 refills | Status: DC

## 2024-05-18 MED ORDER — MELOXICAM 15 MG PO TABS: 15.0000 mg | ORAL_TABLET | Freq: Every day | ORAL | 0 refills | Status: DC

## 2024-05-20 MED ORDER — MELOXICAM 15 MG PO TABS: 15.0000 mg | ORAL_TABLET | Freq: Every day | ORAL | 2 refills | Status: DC

## 2024-05-20 MED ORDER — CITALOPRAM HYDROBROMIDE 20 MG PO TABS: 30.0000 mg | ORAL_TABLET | Freq: Every day | ORAL | 2 refills | Status: DC

## 2024-05-20 NOTE — Addendum Note (Signed)
 Addended by: FRANCIS ROULEAU A on: 05/20/2024 09:41 AM   Modules accepted: Orders

## 2024-05-20 NOTE — Telephone Encounter (Signed)
 Called and spoke to patient to confirm that he indeed wanted the ninety day refills until his next visit. Patient verbally agreed that that he did request the ninety day supply as he is running low on his medications. Thus he is concerned about his antipsychotic medications not being to him into him in time and he not being able to control his actions. I did verify that the refills were being put in today. Patient verbalized understanding.

## 2024-05-30 ENCOUNTER — Encounter: Payer: Self-pay | Admitting: Radiology

## 2024-07-12 ENCOUNTER — Encounter: Payer: Self-pay | Admitting: Family

## 2024-07-12 ENCOUNTER — Ambulatory Visit: Payer: Self-pay | Admitting: Family

## 2024-07-12 VITALS — BP 118/80 | HR 77 | Temp 97.3°F | Ht 73.0 in | Wt 229.2 lb

## 2024-07-12 DIAGNOSIS — J069 Acute upper respiratory infection, unspecified: Secondary | ICD-10-CM

## 2024-07-12 MED ORDER — AZITHROMYCIN 250 MG PO TABS: ORAL_TABLET | ORAL | 0 refills | Status: AC

## 2024-07-12 NOTE — Progress Notes (Signed)
 Patient ID: Jonathan Strong, male    DOB: 1986-07-21, 38 y.o.   MRN: [id number]  Chief Complaint  Patient presents with   Cough    Pt c/o cough at night with dark mucus and nasal congestion, Present for 1 week.   Discussed the use of AI scribe software for clinical note transcription with the patient, who gave verbal consent to proceed.  History of Present Illness Jonathan Strong is a 38 year old male who presents with a persistent cough and nasal symptoms.  He has had a productive cough for a little over a week with yellow mucus. The cough is hard to control, worse at night, and wakes him from sleep. Alka Seltzer at night provides some relief. He denies fever or body aches. He has had a constant runny nose since onset, with clear discharge and no congestion. He has persistent ear popping without pain. 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 He occasionally uses Q-tips to clean his ears.  Assessment & Plan URI Cough with yellowish brown mucus, worse at night, likely viral URI with possible bacterial superinfection. Smoking increases lung infection risk. Lungs clear on exam. - Prescribed azithromycin  (Z-Pak) for 5 days. - Advised cough lozenges during day to sooth throat if dry. - Encouraged increased water intake. - Instructed to report if no improvement.  Nicotine dependence, cigarettes Current smoker, 10 cigarettes/day, primarily at night. Smoking increases risk for lung infections and COPD. - Discussed risks of smoking, including increased risk for lung infections and COPD. - Encourage complete smoking cessation, resource available if needed.   Subjective:    Outpatient Medications Prior to Visit  Medication Sig Dispense Refill   citalopram  (CELEXA ) 20 MG tablet Take 1.5 tablets (30 mg total) by mouth daily. 90 tablet 2   diazepam  (VALIUM ) 10 MG tablet Take 1 tablet (10 mg total) by mouth every 8  (eight) hours as needed. 30 tablet 5   meloxicam  (MOBIC ) 15 MG tablet Take 1 tablet (15 mg total) by mouth daily. 90 tablet 2   methylPREDNISolone  (MEDROL  DOSEPAK) 4 MG TBPK tablet Take as prescribed on the box 21 tablet 0   OVER THE COUNTER MEDICATION Take 2 capsules by mouth daily. Kiki green mushroom supplement     No facility-administered medications prior to visit.   Past Medical History:  Diagnosis Date   Depression    History of chicken pox    History of mononucleosis    treated at [facility] 3 weeks ago per patient   Hyperlipidemia    Past Surgical History:  Procedure Laterality Date   DECOMPRESSIVE LUMBAR LAMINECTOMY LEVEL 1 N/A 07/07/2023   Procedure: L3-4 DECOMPRESSIVE LUMBAR LAMINECTOMY, L4-5 REVISION DECOMPRESSION AND DISCECTOMY;  Surgeon: Georgina Ozell LABOR, MD;  Location: [facility] OR;  Service: Orthopedics;  Laterality: N/A;   LUMBAR LAMINECTOMY N/A 05/01/2023   Procedure: Lumbar four-five MICRODISCECTOMY LUMBAR;  Surgeon: Georgina Ozell LABOR, MD;  Location: [facility] OR;  Service: Orthopedics;  Laterality: N/A;   WISDOM TOOTH EXTRACTION     Allergies[1]    Objective:    Physical Exam Vitals and nursing note reviewed.  Constitutional:      General: He is not in acute distress.    Appearance: Normal appearance.  HENT:     Head: Normocephalic.     Right Ear: Tympanic membrane and ear canal normal.     Left Ear: Tympanic membrane and ear canal normal.  Nose:     Right Sinus: No maxillary sinus tenderness or frontal sinus tenderness.     Left Sinus: No maxillary sinus tenderness or frontal sinus tenderness.     Mouth/Throat:     Mouth: Mucous membranes are moist.     Pharynx: No pharyngeal swelling, oropharyngeal exudate, posterior oropharyngeal erythema or uvula swelling.     Tonsils: No tonsillar exudate or tonsillar abscesses.  Cardiovascular:     Rate and Rhythm: Normal rate and regular rhythm.  Pulmonary:     Effort: Pulmonary effort is normal.     Breath sounds: Normal  breath sounds.  Musculoskeletal:        General: Normal range of motion.     Cervical back: Normal range of motion.  Lymphadenopathy:     Head:     Right side of head: No preauricular or posterior auricular adenopathy.     Left side of head: No preauricular or posterior auricular adenopathy.     Cervical: No cervical adenopathy.  Skin:    General: Skin is warm and dry.  Neurological:     Mental Status: He is alert and oriented to person, place, and time.  Psychiatric:        Mood and Affect: Mood normal.    BP 118/80 (BP Location: Left Arm, Patient Position: Sitting, Cuff Size: Large)   Pulse 77   Temp (!) 97.3 F (36.3 C) (Temporal)   Ht 6' 1 (1.854 m)   Wt 229 lb 4 oz (104 kg)   SpO2 96%   BMI 30.25 kg/m  Wt Readings from Last 3 Encounters:  07/12/24 229 lb 4 oz (104 kg)  03/18/24 224 lb (101.6 kg)  12/29/23 229 lb (103.9 kg)      Jonathan Tally, NP     [1]  Allergies Allergen Reactions   Gabapentin  Other (See Comments)    Became suicidal   Hydrocodone Nausea And Vomiting   Oxycodone  Nausea And Vomiting

## 2024-07-19 ENCOUNTER — Other Ambulatory Visit: Payer: Self-pay | Admitting: Family Medicine

## 2024-08-02 ENCOUNTER — Ambulatory Visit (INDEPENDENT_AMBULATORY_CARE_PROVIDER_SITE_OTHER): Payer: Self-pay | Admitting: Physician Assistant

## 2024-08-02 ENCOUNTER — Encounter: Payer: Self-pay | Admitting: Physician Assistant

## 2024-08-02 VITALS — BP 128/83

## 2024-08-02 DIAGNOSIS — W908XXA Exposure to other nonionizing radiation, initial encounter: Secondary | ICD-10-CM

## 2024-08-02 DIAGNOSIS — Z1283 Encounter for screening for malignant neoplasm of skin: Secondary | ICD-10-CM

## 2024-08-02 DIAGNOSIS — L814 Other melanin hyperpigmentation: Secondary | ICD-10-CM

## 2024-08-02 DIAGNOSIS — L578 Other skin changes due to chronic exposure to nonionizing radiation: Secondary | ICD-10-CM

## 2024-08-02 DIAGNOSIS — L821 Other seborrheic keratosis: Secondary | ICD-10-CM

## 2024-08-02 DIAGNOSIS — D1801 Hemangioma of skin and subcutaneous tissue: Secondary | ICD-10-CM

## 2024-08-02 DIAGNOSIS — D229 Melanocytic nevi, unspecified: Secondary | ICD-10-CM

## 2024-08-02 NOTE — Patient Instructions (Signed)

## 2024-08-02 NOTE — Progress Notes (Signed)
" ° °  New Patient Visit   Subjective  Jonathan Strong is a 39 y.o. male NEW PATIENT who presents for the following: Skin Cancer Screening and Full Body Skin Exam - No history of skin cancer or abnormal moles.   The patient presents for Total-Body Skin Exam (TBSE) for skin cancer screening and mole check. The patient has spots, moles and lesions to be evaluated, some may be new or changing and the patient may have concern these could be cancer.    The following portions of the chart were reviewed this encounter and updated as appropriate: medications, allergies, medical history  Review of Systems:  No other skin or systemic complaints except as noted in HPI or Assessment and Plan.  Objective  Well appearing patient in no apparent distress; mood and affect are within normal limits.  A full examination was performed including scalp, head, eyes, ears, nose, lips, neck, chest, axillae, abdomen, back, buttocks, bilateral upper extremities, bilateral lower extremities, hands, feet, fingers, toes, fingernails, and toenails. All findings within normal limits unless otherwise noted below.   Relevant physical exam findings are noted in the Assessment and Plan.       Right mid back 0.7 cm Irregular brown macule  Assessment & Plan   SKIN CANCER SCREENING PERFORMED TODAY.  ACTINIC DAMAGE - Chronic condition, secondary to cumulative UV/sun exposure - diffuse scaly erythematous macules with underlying dyspigmentation - Recommend daily broad spectrum sunscreen SPF 30+ to sun-exposed areas, reapply every 2 hours as needed.  - Staying in the shade or wearing long sleeves, sun glasses (UVA+UVB protection) and wide brim hats (4-inch brim around the entire circumference of the hat) are also recommended for sun protection.  - Call for new or changing lesions.  LENTIGINES, SEBORRHEIC KERATOSES, HEMANGIOMAS - Benign normal skin lesions - Benign-appearing - Call for any changes  MELANOCYTIC NEVI -  Tan-brown and/or pink-flesh-colored symmetric macules and papules - Benign appearing on exam today - Observation - Call clinic for new or changing moles - Recommend daily use of broad spectrum spf 30+ sunscreen to sun-exposed areas.   NEVUS Right mid back Recheck on follow up SCREENING EXAM FOR SKIN CANCER   ACTINIC SKIN DAMAGE   CHERRY ANGIOMA   LENTIGINES   MULTIPLE BENIGN NEVI   SEBORRHEIC KERATOSIS    Return for 2-3 weeks for nevus follow up, 1 year for TBSE.  I, Roseline Hutchinson, CMA, am acting as scribe for Jonathan Degrazia K, PA-C .   Documentation: I have reviewed the above documentation for accuracy and completeness, and I agree with the above.  Jonathan Ferrari K, PA-C    "

## 2024-08-13 ENCOUNTER — Other Ambulatory Visit: Payer: Self-pay | Admitting: Family Medicine

## 2024-08-13 DIAGNOSIS — G8929 Other chronic pain: Secondary | ICD-10-CM

## 2024-08-16 ENCOUNTER — Ambulatory Visit: Payer: Self-pay | Admitting: Physician Assistant

## 2024-08-23 ENCOUNTER — Other Ambulatory Visit: Payer: Self-pay | Admitting: Family Medicine

## 2024-08-30 ENCOUNTER — Ambulatory Visit: Payer: Self-pay

## 2024-08-30 NOTE — Telephone Encounter (Signed)
 FYI Only or Action Required?: FYI only for provider: appointment scheduled on 08/31/2024 9:40 AMParker, [place].  Patient was last seen in primary care on 07/12/2024 by Lucius Krabbe, NP.  Called Nurse Triage reporting Hand Pain, hand numbness, and Elbow Pain.  Symptoms began about a month ago.  Interventions attempted: Prescription medications: meloxicam  for tennis elbow and golfer elbow.  Symptoms are: stable.  Triage Disposition: See Physician Within 24 Hours  Patient/caregiver understands and will follow disposition?: Yes    Reason for Disposition  Numbness (i.e., loss of sensation) in hand or fingers  (Exceptions: Just tingling; numbness present > 2 weeks.)  Answer Assessment - Initial Assessment Questions Patient reports symptoms started 1 month or more ago both hands numbness. All the time if sleeping , and can be painful and will wake up. Elbow to wrist fingers is numb. And pain is in wrist and elbows , has been diagnosed with tennis and golfers elbow wears wraps when working during the day. repetitive  motions. 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 Like doesn't clot right now, but has a cut on them with tape. Swelling in hands when tried Compression sleeve on one evening and woke up and left  hand swollen like a softball when took it off swelling went down in few hours no redness. . Pain level is moderate. Booked  appointment 08/31/2024 9:40 AMParker, [place] [facility].  1. ONSET: When did the pain start?     1 month or more  2. LOCATION: Where is the pain located?     Both wrist, elbows and hands  3. PAIN: How bad is the pain? (Scale 1-10; or mild, moderate, severe)     Moderate  4. WORK OR EXERCISE: Has there been any recent work or exercise that involved this part (i.e., hand or wrist) of the body?     Works with cabinets and woods reapative motions  6. AGGRAVATING  FACTORS: What makes the pain worse? (e.g., using computer)     Movement  7. OTHER SYMPTOMS: Do you have any other symptoms? (e.g., fever, neck pain, numbness or tingling, rash, swelling) Pain in both wrist and elbow and hands   Patient denies chest pain, shortness of breath, fever , dizziness  Protocols used: Hand Pain-A-AH   Message from Iva S sent at 08/30/2024  3:28 PM EST  Reason for Triage: numbness in hand ongoing for a month

## 2024-08-30 NOTE — Telephone Encounter (Signed)
 Noted.

## 2024-08-31 ENCOUNTER — Other Ambulatory Visit: Payer: Self-pay | Admitting: Family Medicine

## 2024-08-31 ENCOUNTER — Ambulatory Visit: Payer: Self-pay | Admitting: Family Medicine

## 2024-08-31 ENCOUNTER — Encounter: Payer: Self-pay | Admitting: Family Medicine

## 2024-08-31 VITALS — BP 118/78 | HR 77 | Temp 98.1°F | Ht 73.0 in | Wt 230.0 lb

## 2024-08-31 DIAGNOSIS — G5603 Carpal tunnel syndrome, bilateral upper limbs: Secondary | ICD-10-CM

## 2024-08-31 DIAGNOSIS — F411 Generalized anxiety disorder: Secondary | ICD-10-CM

## 2024-08-31 NOTE — Progress Notes (Signed)
" ° °  Jonathan Strong is a 39 y.o. male who presents today for an office visit.  Assessment/Plan:   Paresthesias Patient with bilateral arm and hand paresthesias.  Positive Tinel sign today at bilateral wrist and medial epicondyle.  Does have known history of bilateral carpal tunnel syndrome which is likely contributing though may have some component of cubital tunnel syndrome as well.  Spurling negative and no neck pain-doubt cervical radiculopathy though this is also in the differential.  He is currently on meloxicam .  We did discuss having him start a prednisone  burst however he declined.  He has previously had steroid injections with sports medicine and has done very well with this and would like to follow-up with him to have repeat injections performed.  Will place a new referral today though did encourage patient to call to schedule an appointment ASAP.  He will let us  know if he needs any further assistance.      Subjective:  HPI:  See assessment / plan for status of chronic conditions.   Discussed the use of AI scribe software for clinical note transcription with the patient, who gave verbal consent to proceed.  History of Present Illness Jonathan Strong is a 39 year old male who presents with numbness and tingling in both arms.  He has been experiencing numbness and tingling in both arms for the past month, primarily when bending his arms, such as when sleeping on his side or using the phone. His hands are numb upon waking, and symptoms are exacerbated by repetitive motion, which he has been doing for about two months.  He has a history of golfer and tennis elbow and has been using compression sleeves and braces without significant relief. An episode of wearing a compression sleeve overnight resulted in significant swelling, obscuring his knuckles.  He denies any specific injuries but attributes the symptoms to repetitive motion, as he has been working seven days a  week since Thanksgiving, with only a few days off. He experiences weakness and pain in his hands, particularly after long work hours, with some relief from taking breaks.  He has a history of carpal tunnel syndrome but describes the current symptoms as different. He experiences weakness and pain in his hands, which improve slightly with rest and the use of a wrist brace.  He is currently taking meloxicam  once daily in the morning and is not using any other pain medications like ibuprofen , Advil , or Tylenol . He has noticed increased bleeding from minor injuries, which he attributes to the meloxicam .  No significant neck pain beyond his usual discomfort. Reports numbness and tingling in both hands.         Objective:  Physical Exam: BP 118/78   Pulse 77   Temp 98.1 F (36.7 C) (Temporal)   Ht 6' 1 (1.854 m)   Wt 230 lb (104.3 kg)   SpO2 98%   BMI 30.34 kg/m   Gen: No acute distress, resting comfortably MUSCULOSKELETAL - Left arm: No deformities.  Neurovascularly intact distally.  Positive Tinel sign at wrist and medial epicondyle Right arm: No deformities.  Neurovascularly intact distally.  Positive Tinel sign at wrist and medial epicondyle - Neck: No deformities.  Spurling negative bilaterally. Neuro: Grossly normal, moves all extremities Psych: Normal affect and thought content      Taila Basinski M. Kennyth, MD 08/31/2024 10:15 AM  "

## 2024-09-01 ENCOUNTER — Ambulatory Visit: Admitting: Sports Medicine

## 2024-09-01 ENCOUNTER — Other Ambulatory Visit: Payer: Self-pay

## 2024-09-01 VITALS — HR 79 | Ht 73.0 in | Wt 230.0 lb

## 2024-09-01 DIAGNOSIS — M7701 Medial epicondylitis, right elbow: Secondary | ICD-10-CM

## 2024-09-01 DIAGNOSIS — G5623 Lesion of ulnar nerve, bilateral upper limbs: Secondary | ICD-10-CM

## 2024-09-01 DIAGNOSIS — M7711 Lateral epicondylitis, right elbow: Secondary | ICD-10-CM | POA: Diagnosis not present

## 2024-09-01 DIAGNOSIS — M7712 Lateral epicondylitis, left elbow: Secondary | ICD-10-CM

## 2024-09-01 DIAGNOSIS — M7702 Medial epicondylitis, left elbow: Secondary | ICD-10-CM

## 2024-09-01 MED ORDER — NITROGLYCERIN 0.2 MG/HR TD PT24: 0.2000 mg | MEDICATED_PATCH | Freq: Every day | TRANSDERMAL | 1 refills | Status: AC

## 2024-09-01 NOTE — Patient Instructions (Addendum)
 Nitroglycerin  patches 1/4th start with 1 location and then gradually increase to 2,3, and 4 as tolerated. Decrease number of patches or discontinue if you have headaches   - Use meloxicam  15 mg daily as needed for breakthrough pain.  Recommend limiting chronic NSAIDs to 1-2 doses per week to prevent long-term side effects. Use Tylenol  500 to 1000 mg tablets 2-3 times a day as needed for day-to-day pain relief.    PT referral   Recommend no meloxicam  or other NSAID's for 3 weeks   4-6 week follow up

## 2024-09-01 NOTE — Progress Notes (Signed)
 "               Odis Mace D.[facility] [address] [place] [address] Phone: [phone]   Assessment and Plan:     1. Lateral epicondylitis of both elbows (Primary) 2. Medial epicondylitis of both elbows 3. 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  I do feel that patient could benefit from needling technique without corticosteroid. Patient elected for bilateral medial and lateral epicondyle needling.  Performed today and tolerated well per note below - Patient has been using meloxicam  15 mg daily long-term.  Discussed that I do not recommend long-term use of meloxicam  or other p.o. NSAIDs due to potential long-term side effects. - Recommend no NSAIDs for 3 weeks to allow for healing process from needling technique performed today.  After 3 weeks, use meloxicam  15 mg daily as needed for breakthrough pain.  Recommend limiting chronic NSAIDs to 1-2 doses per week to prevent long-term side effects. Use Tylenol  500 to 1000 mg tablets 2-3 times a day as needed for day-to-day pain relief.    - Patient has had no relief with p.o. prednisone  courses in the past.  Will not repeat prednisone  course at this time -May start one fourth nitroglycerin  patch topically over areas of pain.  Recommend starting with a one patch at a time and gradually increase to 4 if tolerated  Procedure: Ultrasound Guided Common Flexor and extensor Tendon Origin Injection/Needling. Side: bilateral Diagnosis: lateral, medial epicondylitis US  Indication:  - accuracy is paramount for  diagnosis - to ensure therapeutic efficacy or procedural success - to reduce procedural risk  After explaining the procedure, viable alternatives, risks, and answering any questions, consent was given verbally. The site was cleaned with chlorhexidine  prep. An ultrasound transducer was placed on the medial elbow.  The  Medial epicondyle, flexor muscles and tendon attachment on medial epicondyle were seen and care taken to avoid bicep tendon, median and ulnar nerve and brachial artery. There was not fluid surrounding the tendons.  The tendons were normal.  2ml of 1% lidocaine  without epinephrine  was injected for local anesthesia.  Then with ultrasound guidance and sterile technique the tendon was fenestrated with repeated passes of the needle percutaneously at least 20 times.  This was well tolerated.  The needle was removed and dressing placed and post injection instructions were given including  a discussion of likely return of pain today after the anesthetic wears off (with the possibility of worsened pain).  This procedure was pleated on contralateral side.  This procedure was repeated over bilateral lateral epicondyle with 2 mL of 1% lidocaine  without epinephrine  injected for local anesthetic, needle advanced under ultrasound guidance with repeated passes percutaneously at least 20 times through common extensor tendon origin.  Pt was advised to not ice area or use NSAIDs for the next few weeks or months if possible.   Pt was advised to call or return to clinic if these symptoms worsen or fail to improve as anticipated. Images permanently stored.   Pertinent previous records reviewed include none   Follow Up: 4 to 6 weeks for reevaluation.  Could consider PRP versus ECSWT versus  nitroglycerin    Subjective:   I, Moenique Maybell, am serving as a neurosurgeon for Doctor Morene Mace  Chief Complaint: bilat carpal tunnel   HPI:  09/12/2021 HPI: Pt is a 39 y/o male c/o B hand paresthesias x 1-2 months,  L>R. Pt builds insurance account manager. Pt is ambidextrous. He locates his symptoms to his fingers 1-3, hand, and across wrist. Pt notes his L wrist/hand is much worse and the R wrist/hand will only bother him once in awhile. Pt c/o burning pain and numbness worsening and night and waking him.   Neck pain: pain Swelling: no Grip strength: decrease Aggravating factors: none Treatments tried: Carpal tunnel wrist brace at bedtime.   Diagnostic testing: R wrist XR- 11/22/20  09/01/24 Patient is a 39 year old male with bilat carpal tunnel pain. Patient states bilat elbow pain. Intermittent numbness and tingling down to his hands. Hx of elbow tendonitis. He does do repetitive motions with his hands and elbows. Grip strength intact. Meloxicam  doesn't really help. Decreased ROM due to pain.     Relevant Historical Information: None pertinent  Additional pertinent review of systems negative.  Current Medications[1]   Objective:     Vitals:   09/01/24 1058  Pulse: 79  SpO2: 98%  Weight: 230 lb (104.3 kg)  Height: 6' 1 (1.854 m)      Body mass index is 30.34 kg/m.    Physical Exam:    General: Appears well, no acute distress, nontoxic and pleasant Neck: FROM, no pain Neuro: sensation is intact distally with no deficits, strenghth is 5/5 in elbow flexors/extenders/supinator/pronators and wrist flexors/extensors Psych: no evidence of anxiety or depression  Bilateral elbow: No deformity, swelling or muscle wasting Normal Carrying angle ROM:0-140, supination and pronation 90 TTP medial epicondyle, lateral epicondyle, NTTP over triceps, ticeps tendon, olecranon, l, antecubital fossa, biceps tendon, supinator, pronator Positive tinnels over cubital tunnel pain with resisted wrist and middle digit extension pain with resisted wrist flexion  pain with resisted supination pain with resisted pronation Negative valgus stress Negative varus stress Negative milking maneuver     Electronically signed by:  Odis Mace D.[facility] 12:05 PM 09/01/24     [1]  Current Outpatient Medications:    citalopram  (CELEXA ) 20 MG tablet, TAKE 1 AND 1/2 TABLETS BY MOUTH DAILY, Disp: 45 tablet, Rfl: 3   diazepam  (VALIUM ) 10 MG tablet, TAKE 1 TABLET BY MOUTH EVERY 8 HOURS AS NEEDED., Disp: 30 tablet, Rfl: 5   meloxicam  (MOBIC ) 15 MG tablet, TAKE 1 TABLET (15 MG TOTAL) BY MOUTH DAILY., Disp: 30 tablet, Rfl: 0   nitroGLYCERIN  (NITRODUR - DOSED IN MG/24 HR) 0.2 mg/hr patch, Place 1 patch (0.2 mg total) onto the skin daily. 1/4 patch onto skin over painful area, Disp: 30 patch, Rfl: 1   OVER THE COUNTER MEDICATION, Take 2 capsules by mouth daily. Kiki green mushroom supplement, Disp: , Rfl:   "

## 2025-01-02 ENCOUNTER — Encounter: Admitting: Family Medicine

## 2025-08-08 ENCOUNTER — Ambulatory Visit: Payer: Self-pay | Admitting: Physician Assistant
